# Patient Record
Sex: Female | Born: 1960 | Race: Black or African American | Hispanic: No | Marital: Single | Smoking: Current every day smoker
Health system: Southern US, Community
[De-identification: ages and names within clinical notes are randomized; demographics above are authoritative.]

## PROBLEM LIST (undated history)

## (undated) DIAGNOSIS — I10 Essential (primary) hypertension: Secondary | ICD-10-CM

## (undated) DIAGNOSIS — R079 Chest pain, unspecified: Secondary | ICD-10-CM

## (undated) DIAGNOSIS — K529 Noninfective gastroenteritis and colitis, unspecified: Secondary | ICD-10-CM

## (undated) DIAGNOSIS — K2210 Ulcer of esophagus without bleeding: Secondary | ICD-10-CM

## (undated) DIAGNOSIS — F329 Major depressive disorder, single episode, unspecified: Secondary | ICD-10-CM

## (undated) DIAGNOSIS — K635 Polyp of colon: Secondary | ICD-10-CM

## (undated) DIAGNOSIS — A048 Other specified bacterial intestinal infections: Secondary | ICD-10-CM

## (undated) DIAGNOSIS — K200 Eosinophilic esophagitis: Secondary | ICD-10-CM

## (undated) DIAGNOSIS — L84 Corns and callosities: Secondary | ICD-10-CM

## (undated) DIAGNOSIS — I739 Peripheral vascular disease, unspecified: Secondary | ICD-10-CM

## (undated) DIAGNOSIS — K2270 Barrett's esophagus without dysplasia: Secondary | ICD-10-CM

## (undated) DIAGNOSIS — G2581 Restless legs syndrome: Secondary | ICD-10-CM

## (undated) DIAGNOSIS — K209 Esophagitis, unspecified without bleeding: Secondary | ICD-10-CM

## (undated) DIAGNOSIS — F32A Depression, unspecified: Secondary | ICD-10-CM

## (undated) DIAGNOSIS — J189 Pneumonia, unspecified organism: Secondary | ICD-10-CM

## (undated) DIAGNOSIS — G8929 Other chronic pain: Secondary | ICD-10-CM

## (undated) DIAGNOSIS — K589 Irritable bowel syndrome without diarrhea: Secondary | ICD-10-CM

## (undated) DIAGNOSIS — R0602 Shortness of breath: Secondary | ICD-10-CM

## (undated) DIAGNOSIS — I219 Acute myocardial infarction, unspecified: Secondary | ICD-10-CM

## (undated) DIAGNOSIS — G709 Myoneural disorder, unspecified: Secondary | ICD-10-CM

## (undated) DIAGNOSIS — M549 Dorsalgia, unspecified: Secondary | ICD-10-CM

## (undated) DIAGNOSIS — R072 Precordial pain: Secondary | ICD-10-CM

## (undated) DIAGNOSIS — G4710 Hypersomnia, unspecified: Secondary | ICD-10-CM

## (undated) DIAGNOSIS — J45909 Unspecified asthma, uncomplicated: Secondary | ICD-10-CM

## (undated) DIAGNOSIS — M79671 Pain in right foot: Secondary | ICD-10-CM

## (undated) DIAGNOSIS — R5383 Other fatigue: Secondary | ICD-10-CM

## (undated) DIAGNOSIS — R0989 Other specified symptoms and signs involving the circulatory and respiratory systems: Secondary | ICD-10-CM

## (undated) DIAGNOSIS — M129 Arthropathy, unspecified: Secondary | ICD-10-CM

## (undated) DIAGNOSIS — M1990 Unspecified osteoarthritis, unspecified site: Secondary | ICD-10-CM

## (undated) DIAGNOSIS — E039 Hypothyroidism, unspecified: Secondary | ICD-10-CM

## (undated) DIAGNOSIS — Q398 Other congenital malformations of esophagus: Secondary | ICD-10-CM

## (undated) DIAGNOSIS — K2970 Gastritis, unspecified, without bleeding: Secondary | ICD-10-CM

## (undated) DIAGNOSIS — I2510 Atherosclerotic heart disease of native coronary artery without angina pectoris: Secondary | ICD-10-CM

## (undated) DIAGNOSIS — I6523 Occlusion and stenosis of bilateral carotid arteries: Secondary | ICD-10-CM

## (undated) DIAGNOSIS — E1140 Type 2 diabetes mellitus with diabetic neuropathy, unspecified: Secondary | ICD-10-CM

## (undated) DIAGNOSIS — K5281 Eosinophilic gastritis or gastroenteritis: Secondary | ICD-10-CM

## (undated) DIAGNOSIS — M25511 Pain in right shoulder: Secondary | ICD-10-CM

## (undated) DIAGNOSIS — R109 Unspecified abdominal pain: Secondary | ICD-10-CM

## (undated) DIAGNOSIS — Z90710 Acquired absence of both cervix and uterus: Secondary | ICD-10-CM

## (undated) DIAGNOSIS — R1031 Right lower quadrant pain: Secondary | ICD-10-CM

## (undated) DIAGNOSIS — K219 Gastro-esophageal reflux disease without esophagitis: Secondary | ICD-10-CM

## (undated) HISTORY — DX: Pain in right foot: M79.671

## (undated) HISTORY — DX: Esophagitis, unspecified without bleeding: K20.90

## (undated) HISTORY — PX: THYROIDECTOMY: SHX17

## (undated) HISTORY — DX: Chest pain, unspecified: R07.9

## (undated) HISTORY — DX: Ulcer of esophagus without bleeding: K22.10

## (undated) HISTORY — DX: Restless legs syndrome: G25.81

## (undated) HISTORY — DX: Right lower quadrant pain: R10.31

## (undated) HISTORY — PX: EYE SURGERY: SHX253

## (undated) HISTORY — DX: Occlusion and stenosis of bilateral carotid arteries: I65.23

## (undated) HISTORY — DX: Eosinophilic esophagitis: K20.0

## (undated) HISTORY — DX: Precordial pain: R07.2

## (undated) HISTORY — DX: Arthropathy, unspecified: M12.9

## (undated) HISTORY — DX: Type 2 diabetes mellitus with diabetic neuropathy, unspecified: E11.40

## (undated) HISTORY — DX: Eosinophilic gastritis or gastroenteritis: K52.81

## (undated) HISTORY — DX: Other congenital malformations of esophagus: Q39.8

## (undated) HISTORY — DX: Noninfective gastroenteritis and colitis, unspecified: K52.9

## (undated) HISTORY — PX: BREAST EXCISIONAL BIOPSY: SUR124

## (undated) HISTORY — DX: Pain in right shoulder: M25.511

## (undated) HISTORY — DX: Unspecified abdominal pain: R10.9

## (undated) HISTORY — DX: Acquired absence of both cervix and uterus: Z90.710

## (undated) HISTORY — DX: Dorsalgia, unspecified: M54.9

## (undated) HISTORY — DX: Gastritis, unspecified, without bleeding: K29.70

## (undated) HISTORY — PX: AUGMENTATION MAMMAPLASTY: SUR837

## (undated) HISTORY — DX: Other fatigue: R53.83

## (undated) HISTORY — DX: Other specified bacterial intestinal infections: A04.8

## (undated) HISTORY — DX: Shortness of breath: R06.02

## (undated) HISTORY — DX: Barrett's esophagus without dysplasia: K22.70

## (undated) HISTORY — PX: ABDOMINAL HYSTERECTOMY: SHX81

## (undated) HISTORY — DX: Esophagitis, unspecified: K20.9

## (undated) HISTORY — DX: Polyp of colon: K63.5

## (undated) HISTORY — DX: Atherosclerotic heart disease of native coronary artery without angina pectoris: I25.10

## (undated) HISTORY — DX: Peripheral vascular disease, unspecified: I73.9

## (undated) HISTORY — DX: Other chronic pain: G89.29

## (undated) HISTORY — DX: Hypersomnia, unspecified: G47.10

## (undated) HISTORY — PX: FOOT SURGERY: SHX648

## (undated) HISTORY — DX: Other specified symptoms and signs involving the circulatory and respiratory systems: R09.89

## (undated) HISTORY — DX: Corns and callosities: L84

---
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
## 2016-10-13 ENCOUNTER — Other Ambulatory Visit: Payer: Self-pay | Admitting: Internal Medicine

## 2016-10-13 DIAGNOSIS — Z1231 Encounter for screening mammogram for malignant neoplasm of breast: Secondary | ICD-10-CM

## 2016-10-30 ENCOUNTER — Ambulatory Visit: Payer: Self-pay

## 2016-11-19 ENCOUNTER — Ambulatory Visit
Admission: RE | Admit: 2016-11-19 | Discharge: 2016-11-19 | Disposition: A | Discharge status: Home / Self Care | Payer: Medicaid Other | Source: Ambulatory Visit | Attending: Internal Medicine | Admitting: Internal Medicine

## 2016-11-19 ENCOUNTER — Encounter: Payer: Self-pay | Admitting: Radiology

## 2016-11-19 DIAGNOSIS — Z1231 Encounter for screening mammogram for malignant neoplasm of breast: Secondary | ICD-10-CM

## 2016-11-27 ENCOUNTER — Encounter (HOSPITAL_COMMUNITY): Payer: Self-pay | Admitting: Emergency Medicine

## 2016-11-27 ENCOUNTER — Emergency Department (HOSPITAL_COMMUNITY)
Admission: EM | Admit: 2016-11-27 | Discharge: 2016-11-27 | Disposition: A | Discharge status: Home / Self Care | Payer: Medicaid Other | Attending: Emergency Medicine | Admitting: Emergency Medicine

## 2016-11-27 DIAGNOSIS — S199XXA Unspecified injury of neck, initial encounter: Secondary | ICD-10-CM | POA: Diagnosis present

## 2016-11-27 DIAGNOSIS — Y9289 Other specified places as the place of occurrence of the external cause: Secondary | ICD-10-CM | POA: Diagnosis not present

## 2016-11-27 DIAGNOSIS — S161XXA Strain of muscle, fascia and tendon at neck level, initial encounter: Secondary | ICD-10-CM | POA: Insufficient documentation

## 2016-11-27 DIAGNOSIS — Y939 Activity, unspecified: Secondary | ICD-10-CM | POA: Diagnosis not present

## 2016-11-27 DIAGNOSIS — Y999 Unspecified external cause status: Secondary | ICD-10-CM | POA: Diagnosis not present

## 2016-11-27 HISTORY — DX: Irritable bowel syndrome, unspecified: K58.9

## 2016-11-27 HISTORY — DX: Unspecified osteoarthritis, unspecified site: M19.90

## 2016-11-27 MED ORDER — CYCLOBENZAPRINE HCL 10 MG PO TABS: 10.0000 mg | ORAL_TABLET | Freq: Two times a day (BID) | ORAL | 0 refills | Status: DC | PRN

## 2016-11-27 NOTE — ED Notes (Signed)
See provider assessment 
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
## 2016-11-27 NOTE — Discharge Instructions (Signed)
Please keep your appointment with your primary care provider on Monday, 11/30/16. Return to the Emergency Department if you develop new or worsening symptoms. Please do not take Flexeril if you have to drive because it can make you sleepy.
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
## 2017-05-12 ENCOUNTER — Telehealth: Payer: Self-pay

## 2017-05-12 NOTE — Telephone Encounter (Signed)
SENT NOTES TO NL SCHEDULING.

## 2017-05-17 ENCOUNTER — Telehealth: Payer: Self-pay | Admitting: Cardiovascular Disease

## 2017-05-17 NOTE — Telephone Encounter (Signed)
Received records from [facility] for appointment on 06/08/17 with Dr Allyson Sabal.  Records put with Dr Hazle Coca schedule for 06/08/17. lp

## 2017-05-25 ENCOUNTER — Encounter: Payer: Self-pay | Admitting: Cardiovascular Disease

## 2017-05-25 ENCOUNTER — Other Ambulatory Visit: Payer: Self-pay | Admitting: *Deleted

## 2017-05-25 ENCOUNTER — Ambulatory Visit (INDEPENDENT_AMBULATORY_CARE_PROVIDER_SITE_OTHER): Payer: Medicaid Other | Admitting: Cardiovascular Disease

## 2017-05-25 VITALS — BP 120/70 | HR 75 | Ht 62.0 in | Wt 142.8 lb

## 2017-05-25 DIAGNOSIS — I10 Essential (primary) hypertension: Secondary | ICD-10-CM | POA: Insufficient documentation

## 2017-05-25 DIAGNOSIS — I200 Unstable angina: Secondary | ICD-10-CM

## 2017-05-25 DIAGNOSIS — Z79899 Other long term (current) drug therapy: Secondary | ICD-10-CM

## 2017-05-25 DIAGNOSIS — Z01812 Encounter for preprocedural laboratory examination: Secondary | ICD-10-CM

## 2017-05-25 DIAGNOSIS — I2510 Atherosclerotic heart disease of native coronary artery without angina pectoris: Secondary | ICD-10-CM | POA: Diagnosis not present

## 2017-05-25 DIAGNOSIS — E785 Hyperlipidemia, unspecified: Secondary | ICD-10-CM | POA: Insufficient documentation

## 2017-05-25 DIAGNOSIS — I25110 Atherosclerotic heart disease of native coronary artery with unstable angina pectoris: Secondary | ICD-10-CM

## 2017-05-25 NOTE — Progress Notes (Signed)
05/25/2017 Debra MetroYvonne Valdez   09/11/1960  [id number]  Primary Physician [facility], [facility] Primary Cardiologist: Runell GessJonathan J Gelsey Amyx MD Nicholes CalamityFACP, FACC, FAHA, [place]  HPI:  [name] is a 56 y.o. female single African-American female mother of one daughter, Rica RecordsJamika, who accompanies her today. She was referred by [facility]net[facility]Mike [facility] for consideration of obtuse marginal branch intervention. She has a history of 40-pack-years of tobacco abuse having quit on 04/05/17. She has history of diabetes, hypertension and hyperlipidemia. She had an inferior [facility] [place]. She underwent cardiac catheterization through the right femoral approach on 04/06/17 and had a 2.75 x 32 mm long drug-eluting stent placed in her RCA. She had moderate proximal LAD disease and high-grade obtuse marginal branch disease with normal LV function. She remains on antiplatelet therapy. The intention was to perform staged intervention on the obtuse marginal branch which unfortunately could not occur because of insurance considerations.   Current Meds  Medication Sig  . amLODipine (NORVASC) 5 MG tablet Take 5 mg by mouth daily.  Marland Kitchen. atorvastatin (LIPITOR) 20 MG tablet Take 20 mg by mouth daily.  . baclofen (LIORESAL) 20 MG tablet Take 20 mg by mouth 3 (three) times daily.  Marland Kitchen. BRILINTA 90 MG TABS tablet Take 90 mg by mouth 2 (two) times daily.  . carvedilol (COREG) 6.25 MG tablet Take 6.25 mg by mouth 2 (two) times daily.  . cyclobenzaprine (FLEXERIL) 10 MG tablet Take 1 tablet (10 mg total) by mouth 2 (two) times daily as needed for muscle spasms.  . DULoxetine (CYMBALTA) 30 MG capsule Take 30 mg by mouth daily.  Marland Kitchen. esomeprazole (NEXIUM) 40 MG capsule Take 40 mg by mouth daily.  Marland Kitchen. HYDROcodone-acetaminophen (NORCO) 7.5-325 MG tablet Take 1 tablet by mouth as directed.  Marland Kitchen. levothyroxine (SYNTHROID, LEVOTHROID) 100 MCG tablet Take 100 mcg by mouth daily.  Marland Kitchen. lisinopril (PRINIVIL,ZESTRIL) 20 MG  tablet Take 20 mg by mouth daily.  . metFORMIN (GLUCOPHAGE) 1000 MG tablet Take 1,000 mg by mouth 2 (two) times daily.  . metoCLOPramide (REGLAN) 10 MG tablet Take 10 mg by mouth daily.  . pravastatin (PRAVACHOL) 20 MG tablet Take 20 mg by mouth daily.  . sucralfate (CARAFATE) 1 g tablet Take 1 g by mouth 2 (two) times daily.  . TRESIBA FLEXTOUCH 200 UNIT/ML SOPN Inject 25 Units as directed daily.     Allergies  Allergen Reactions  . Motrin [Ibuprofen] Hives  . Sulfa Antibiotics Hives  . Bactrim [Sulfamethoxazole-Trimethoprim] Rash    Social History   Social History  . Marital status: Single    Spouse name: N/A  . Number of children: N/A  . Years of education: N/A   Occupational History  . Not on file.   Social History Main Topics  . Smoking status: Former Smoker    Types: Cigarettes    Quit date: 04/05/2017  . Smokeless tobacco: Never Used  . Alcohol use Not on file  . Drug use: Unknown  . Sexual activity: Not on file   Other Topics Concern  . Not on file   Social History Narrative  . No narrative on file     Review of Systems: General: negative for chills, fever, night sweats or weight changes.  Cardiovascular: negative for chest pain, dyspnea on exertion, edema, orthopnea, palpitations, paroxysmal nocturnal dyspnea or shortness of breath Dermatological: negative for rash Respiratory: negative for cough or wheezing Urologic: negative for hematuria Abdominal: negative for nausea, vomiting, diarrhea, bright red blood  per rectum, melena, or hematemesis Neurologic: negative for visual changes, syncope, or dizziness All other systems reviewed and are otherwise negative except as noted above.    Blood pressure 120/70, pulse 75, height 5\' 2"  (1.575 m), weight 142 lb 12.8 oz (64.8 kg).  General appearance: alert and no distress Neck: no adenopathy, no carotid bruit, no JVD, supple, symmetrical, trachea midline and thyroid not enlarged, symmetric, no  tenderness/mass/nodules Lungs: clear to auscultation bilaterally Heart: regular rate and rhythm, S1, S2 normal, no murmur, click, rub or gallop Extremities: extremities normal, atraumatic, no cyanosis or edema Pulses: 2+ and symmetric Skin: Skin color, texture, turgor normal. No rashes or lesions Neurologic: Alert and oriented X 3, normal strength and tone. Normal symmetric reflexes. Normal coordination and gait  EKG sinus rhythm at 75 with inferior Q waves and inferolateral T-wave inversion. I personally reviewed this EKG.  ASSESSMENT AND PLAN:   Hyperlipidemia History of hyperlipidemia on statin therapy followed by her PCP  Essential hypertension History of essential hypertension with blood pressure measures 120/70. She is on amlodipine, carvedilol and lisinopril. Continue current meds at current dosing  Coronary artery disease History of CAD status post inferior STEMI 9/3/18at [facility] in [place]. She required catheterization on 04/06/17 revealing an occluded RCA-1 stenting using a 2.75 x 32 mm long drug-eluting stent. In addition, she had high-grade obtuse marginal branch disease and moderate LAD disease with normal LV function. She is on dual antibiotic therapy with aspirin and Brilenta. The plan was to perform staged intervention after obtuse marginal branch however because of continued insurance considerations this could not be done up in [place] and she was referred here for Intervention.      Runell Gess MD FACP,FACC,FAHA, [facility] 05/25/2017 11:44 AM

## 2017-05-25 NOTE — Assessment & Plan Note (Signed)
History of essential hypertension with blood pressure measures 120/70. She is on amlodipine, carvedilol and lisinopril. Continue current meds at current dosing

## 2017-05-25 NOTE — Assessment & Plan Note (Signed)
History of hyperlipidemia on statin therapy followed by her PCP. 

## 2017-05-25 NOTE — Patient Instructions (Signed)
   [facility] [address]  [place] [address] Dept: [phone] Loc: [phone]  Malcolm MetroYvonne Schiffman  05/25/2017  You are scheduled for a Cardiac Catheterization on Thursday, October 25 with Dr. Nanetta BattyJonathan Berry.  1. [facility] at [facility]: [address] [place], [place] [address] at 1:00 PM (two hours before your procedure to ensure your preparation). Free valet parking service is available.   Special note: Every effort is made to have your procedure done on time. Please understand that emergencies sometimes delay scheduled procedures.  2. Diet: you may have a light breakfast before 7:30am  3. Labs: TODAY  4. Medication instructions in preparation for your procedure: HOLD metformin 24 hours prior to procedure and 48 hours after procedure   On the morning of your procedure, take your Aspirin + Brilinta and any morning medicines NOT listed above.  You may use sips of water.  5. Plan for one night stay--bring personal belongings. 6. Bring a current list of your medications and current insurance cards. 7. You MUST have a responsible person to drive you home. 8. Someone MUST be with you the first 24 hours after you arrive home or your discharge will be delayed. 9. Please wear clothes that are easy to get on and off and wear slip-on shoes.  Thank you for allowing us to care for you!   -- [facility]

## 2017-05-25 NOTE — Assessment & Plan Note (Signed)
History of CAD status post inferior [facility] in [place]. She required catheterization on 04/06/17 revealing an occluded RCA-1 stenting using a 2.75 x 32 mm long drug-eluting stent. In addition, she had high-grade obtuse marginal branch disease and moderate LAD disease with normal LV function. She is on dual antibiotic therapy with aspirin and Brilenta. The plan was to perform staged intervention after obtuse marginal branch however because of continued insurance considerations this could not be done up in [place] and she was referred here for Intervention.

## 2017-05-26 ENCOUNTER — Telehealth: Payer: Self-pay

## 2017-05-26 LAB — BASIC METABOLIC PANEL
BUN / CREAT RATIO: 17 (ref 9–23)
BUN: 14 mg/dL (ref 6–24)
CALCIUM: 9.9 mg/dL (ref 8.7–10.2)
CHLORIDE: 100 mmol/L (ref 96–106)
CO2: 25 mmol/L (ref 20–29)
CREATININE: 0.84 mg/dL (ref 0.57–1.00)
GFR calc Af Amer: 89 mL/min/{1.73_m2} (ref >59)
GFR calc non Af Amer: 77 mL/min/{1.73_m2} (ref >59)
GLUCOSE: 202 mg/dL — AB (ref 65–99)
Potassium: 5.2 mmol/L (ref 3.5–5.2)
Sodium: 139 mmol/L (ref 134–144)

## 2017-05-26 LAB — CBC
HEMATOCRIT: 40.7 % (ref 34.0–46.6)
HEMOGLOBIN: 13.6 g/dL (ref 11.1–15.9)
MCH: 28.3 pg (ref 26.6–33.0)
MCHC: 33.4 g/dL (ref 31.5–35.7)
MCV: 85 fL (ref 79–97)
Platelets: 256 10*3/uL (ref 150–379)
RBC: 4.8 x10E6/uL (ref 3.77–5.28)
RDW: 16.3 % — AB (ref 12.3–15.4)
WBC: 10.6 10*3/uL (ref 3.4–10.8)

## 2017-05-26 NOTE — Telephone Encounter (Signed)
Patient returned call. Aware to take half of tresiba today.

## 2017-05-26 NOTE — Telephone Encounter (Signed)
LM for patient to return call per Boneta LucksJenny RN request Patient needs to take half dose [place] today  Called daughter Fredonia Highlandamika - was at [facility] with patient yesterday. She is aware of med instructions for her mom & will relay message.

## 2017-05-26 NOTE — Telephone Encounter (Signed)
Call placed to Pt twice.  Phone picked up, no one would answer.  Sending to nurse for Dr. Allyson SabalBerry.

## 2017-05-27 ENCOUNTER — Encounter (HOSPITAL_COMMUNITY)
Admission: RE | Disposition: A | Discharge status: Home / Self Care | Payer: Self-pay | Source: Ambulatory Visit | Attending: Cardiovascular Disease

## 2017-05-27 ENCOUNTER — Ambulatory Visit (HOSPITAL_COMMUNITY)
Admission: RE | Admit: 2017-05-27 | Discharge: 2017-05-28 | Disposition: A | Discharge status: Home / Self Care | Payer: Medicaid Other | Source: Ambulatory Visit | Attending: Cardiovascular Disease | Admitting: Cardiovascular Disease

## 2017-05-27 DIAGNOSIS — Z7902 Long term (current) use of antithrombotics/antiplatelets: Secondary | ICD-10-CM | POA: Insufficient documentation

## 2017-05-27 DIAGNOSIS — E785 Hyperlipidemia, unspecified: Secondary | ICD-10-CM | POA: Diagnosis not present

## 2017-05-27 DIAGNOSIS — Z882 Allergy status to sulfonamides status: Secondary | ICD-10-CM | POA: Diagnosis not present

## 2017-05-27 DIAGNOSIS — I200 Unstable angina: Secondary | ICD-10-CM

## 2017-05-27 DIAGNOSIS — I2510 Atherosclerotic heart disease of native coronary artery without angina pectoris: Secondary | ICD-10-CM | POA: Diagnosis present

## 2017-05-27 DIAGNOSIS — E119 Type 2 diabetes mellitus without complications: Secondary | ICD-10-CM | POA: Diagnosis not present

## 2017-05-27 DIAGNOSIS — I2583 Coronary atherosclerosis due to lipid rich plaque: Secondary | ICD-10-CM | POA: Diagnosis not present

## 2017-05-27 DIAGNOSIS — Z955 Presence of coronary angioplasty implant and graft: Secondary | ICD-10-CM

## 2017-05-27 DIAGNOSIS — Z886 Allergy status to analgesic agent status: Secondary | ICD-10-CM | POA: Diagnosis not present

## 2017-05-27 DIAGNOSIS — Z79899 Other long term (current) drug therapy: Secondary | ICD-10-CM | POA: Insufficient documentation

## 2017-05-27 DIAGNOSIS — I10 Essential (primary) hypertension: Secondary | ICD-10-CM | POA: Diagnosis not present

## 2017-05-27 DIAGNOSIS — Z7984 Long term (current) use of oral hypoglycemic drugs: Secondary | ICD-10-CM | POA: Insufficient documentation

## 2017-05-27 DIAGNOSIS — I252 Old myocardial infarction: Secondary | ICD-10-CM | POA: Diagnosis not present

## 2017-05-27 DIAGNOSIS — Z87891 Personal history of nicotine dependence: Secondary | ICD-10-CM | POA: Diagnosis not present

## 2017-05-27 DIAGNOSIS — I25110 Atherosclerotic heart disease of native coronary artery with unstable angina pectoris: Secondary | ICD-10-CM | POA: Diagnosis not present

## 2017-05-27 HISTORY — PX: LEFT HEART CATH AND CORONARY ANGIOGRAPHY: CATH118249

## 2017-05-27 HISTORY — PX: CORONARY PRESSURE/FFR STUDY: CATH118243

## 2017-05-27 HISTORY — PX: CORONARY STENT INTERVENTION: CATH118234

## 2017-05-27 LAB — PROTIME-INR
INR: 0.93
Prothrombin Time: 12.3 seconds (ref 11.4–15.2)

## 2017-05-27 LAB — POCT ACTIVATED CLOTTING TIME: Activated Clotting Time: 362 seconds

## 2017-05-27 LAB — GLUCOSE, CAPILLARY: GLUCOSE-CAPILLARY: 200 mg/dL — AB (ref 65–99)

## 2017-05-27 SURGERY — LEFT HEART CATH AND CORONARY ANGIOGRAPHY
Anesthesia: LOCAL

## 2017-05-27 MED ORDER — IOPAMIDOL (ISOVUE-370) INJECTION 76%
INTRAVENOUS | Status: AC
  Filled 2017-05-27: qty 50

## 2017-05-27 MED ORDER — ATORVASTATIN CALCIUM 20 MG PO TABS
20.0000 mg | ORAL_TABLET | Freq: Every day | ORAL | Status: DC
  Administered 2017-05-27 – 2017-05-28 (×2): 20 mg via ORAL
  Filled 2017-05-27 (×2): qty 1

## 2017-05-27 MED ORDER — FENTANYL CITRATE (PF) 100 MCG/2ML IJ SOLN
INTRAMUSCULAR | Status: AC
  Filled 2017-05-27: qty 2

## 2017-05-27 MED ORDER — MORPHINE SULFATE (PF) 10 MG/ML IV SOLN: 2.0000 mg | INTRAVENOUS | Status: DC | PRN

## 2017-05-27 MED ORDER — SODIUM CHLORIDE 0.9% FLUSH
3.0000 mL | Freq: Two times a day (BID) | INTRAVENOUS | Status: DC
  Administered 2017-05-27: 3 mL via INTRAVENOUS

## 2017-05-27 MED ORDER — SODIUM CHLORIDE 0.9% FLUSH: 3.0000 mL | INTRAVENOUS | Status: DC | PRN

## 2017-05-27 MED ORDER — BIVALIRUDIN TRIFLUOROACETATE 250 MG IV SOLR
INTRAVENOUS | Status: AC
  Filled 2017-05-27: qty 250

## 2017-05-27 MED ORDER — SODIUM CHLORIDE 0.9 % IV SOLN: INTRAVENOUS | Status: AC

## 2017-05-27 MED ORDER — AMLODIPINE BESYLATE 5 MG PO TABS
5.0000 mg | ORAL_TABLET | Freq: Every day | ORAL | Status: DC
  Administered 2017-05-27 – 2017-05-28 (×2): 5 mg via ORAL
  Filled 2017-05-27 (×2): qty 1

## 2017-05-27 MED ORDER — ASPIRIN 81 MG PO CHEW
81.0000 mg | CHEWABLE_TABLET | ORAL | Status: AC
  Administered 2017-05-27: 81 mg via ORAL

## 2017-05-27 MED ORDER — ALBUTEROL SULFATE (2.5 MG/3ML) 0.083% IN NEBU: 3.0000 mL | INHALATION_SOLUTION | Freq: Four times a day (QID) | RESPIRATORY_TRACT | Status: DC | PRN

## 2017-05-27 MED ORDER — ADULT MULTIVITAMIN W/MINERALS CH
1.0000 | ORAL_TABLET | Freq: Every day | ORAL | Status: DC
  Administered 2017-05-28: 11:00:00 1 via ORAL
  Filled 2017-05-27: qty 1

## 2017-05-27 MED ORDER — LIDOCAINE HCL 2 % IJ SOLN
INTRAMUSCULAR | Status: DC | PRN
  Administered 2017-05-27: 10 mL via INTRADERMAL

## 2017-05-27 MED ORDER — PANTOPRAZOLE SODIUM 40 MG PO TBEC
40.0000 mg | DELAYED_RELEASE_TABLET | Freq: Every day | ORAL | Status: DC
  Administered 2017-05-28: 11:00:00 40 mg via ORAL
  Filled 2017-05-27: qty 1

## 2017-05-27 MED ORDER — BACLOFEN 20 MG PO TABS
20.0000 mg | ORAL_TABLET | Freq: Three times a day (TID) | ORAL | Status: DC
  Administered 2017-05-27 – 2017-05-28 (×2): 20 mg via ORAL
  Filled 2017-05-27 (×3): qty 1

## 2017-05-27 MED ORDER — ANGIOPLASTY BOOK
Freq: Once | Status: AC
  Administered 2017-05-27: 23:00:00
  Filled 2017-05-27: qty 1

## 2017-05-27 MED ORDER — CLOPIDOGREL BISULFATE 300 MG PO TABS
ORAL_TABLET | ORAL | Status: DC | PRN
  Administered 2017-05-27: 600 mg via ORAL

## 2017-05-27 MED ORDER — TICAGRELOR 90 MG PO TABS: 90.0000 mg | ORAL_TABLET | Freq: Two times a day (BID) | ORAL | Status: DC

## 2017-05-27 MED ORDER — SODIUM CHLORIDE 0.9 % IV SOLN
INTRAVENOUS | Status: DC
  Administered 2017-05-27: 13:00:00 via INTRAVENOUS

## 2017-05-27 MED ORDER — LISINOPRIL 40 MG PO TABS
40.0000 mg | ORAL_TABLET | Freq: Every day | ORAL | Status: DC
  Administered 2017-05-28: 11:00:00 40 mg via ORAL
  Filled 2017-05-27 (×2): qty 1

## 2017-05-27 MED ORDER — MIDAZOLAM HCL 2 MG/2ML IJ SOLN
INTRAMUSCULAR | Status: AC
Start: 2017-05-27 — End: 2017-05-27
  Filled 2017-05-27: qty 2

## 2017-05-27 MED ORDER — LIDOCAINE HCL 2 % IJ SOLN
INTRAMUSCULAR | Status: AC
  Filled 2017-05-27: qty 20

## 2017-05-27 MED ORDER — ACETAMINOPHEN 325 MG PO TABS: 650.0000 mg | ORAL_TABLET | ORAL | Status: DC | PRN

## 2017-05-27 MED ORDER — CARVEDILOL 6.25 MG PO TABS
6.2500 mg | ORAL_TABLET | Freq: Two times a day (BID) | ORAL | Status: DC
  Administered 2017-05-28: 6.25 mg via ORAL
  Filled 2017-05-27: qty 2
  Filled 2017-05-27 (×3): qty 1

## 2017-05-27 MED ORDER — LABETALOL HCL 5 MG/ML IV SOLN: 10.0000 mg | INTRAVENOUS | Status: AC | PRN

## 2017-05-27 MED ORDER — ASPIRIN 81 MG PO CHEW
CHEWABLE_TABLET | ORAL | Status: AC
  Administered 2017-05-27: 81 mg via ORAL
  Filled 2017-05-27: qty 1

## 2017-05-27 MED ORDER — ONDANSETRON HCL 4 MG/2ML IJ SOLN
4.0000 mg | Freq: Four times a day (QID) | INTRAMUSCULAR | Status: DC | PRN
  Administered 2017-05-28: 4 mg via INTRAVENOUS
  Filled 2017-05-27: qty 2

## 2017-05-27 MED ORDER — BIVALIRUDIN TRIFLUOROACETATE 250 MG IV SOLR
INTRAVENOUS | Status: AC | PRN
  Administered 2017-05-27: 17:00:00
  Administered 2017-05-27: 1.75 mg/kg/h via INTRAVENOUS

## 2017-05-27 MED ORDER — METOCLOPRAMIDE HCL 10 MG PO TABS
10.0000 mg | ORAL_TABLET | Freq: Two times a day (BID) | ORAL | Status: DC
  Administered 2017-05-27 – 2017-05-28 (×2): 10 mg via ORAL
  Filled 2017-05-27 (×3): qty 1

## 2017-05-27 MED ORDER — ADENOSINE (DIAGNOSTIC) 140MCG/KG/MIN
INTRAVENOUS | Status: DC | PRN
  Administered 2017-05-27: 140 ug/kg/min via INTRAVENOUS

## 2017-05-27 MED ORDER — IOPAMIDOL (ISOVUE-370) INJECTION 76%
INTRAVENOUS | Status: AC
  Filled 2017-05-27: qty 100

## 2017-05-27 MED ORDER — ASPIRIN EC 81 MG PO TBEC
81.0000 mg | DELAYED_RELEASE_TABLET | Freq: Every day | ORAL | Status: DC
  Administered 2017-05-28: 81 mg via ORAL
  Filled 2017-05-27: qty 1

## 2017-05-27 MED ORDER — SODIUM CHLORIDE 0.9 % IV SOLN: 250.0000 mL | INTRAVENOUS | Status: DC | PRN

## 2017-05-27 MED ORDER — INSULIN GLARGINE 100 UNIT/ML ~~LOC~~ SOLN
25.0000 [IU] | Freq: Every day | SUBCUTANEOUS | Status: DC
  Administered 2017-05-28 (×2): 25 [IU] via SUBCUTANEOUS
  Filled 2017-05-27: qty 0.25

## 2017-05-27 MED ORDER — BIVALIRUDIN BOLUS VIA INFUSION - CUPID
INTRAVENOUS | Status: DC | PRN
  Administered 2017-05-27: 47.625 mg via INTRAVENOUS

## 2017-05-27 MED ORDER — INSULIN GLARGINE 100 UNIT/ML ~~LOC~~ SOLN
25.0000 [IU] | Freq: Every day | SUBCUTANEOUS | Status: DC
  Filled 2017-05-27: qty 0.25

## 2017-05-27 MED ORDER — HYDROCODONE-ACETAMINOPHEN 7.5-325 MG PO TABS
1.0000 | ORAL_TABLET | Freq: Four times a day (QID) | ORAL | Status: DC | PRN
  Administered 2017-05-27: 1 via ORAL
  Filled 2017-05-27: qty 1

## 2017-05-27 MED ORDER — HYDRALAZINE HCL 20 MG/ML IJ SOLN
5.0000 mg | INTRAMUSCULAR | Status: AC | PRN
  Administered 2017-05-27 (×2): 5 mg via INTRAVENOUS
  Filled 2017-05-27: qty 1

## 2017-05-27 MED ORDER — ADENOSINE 12 MG/4ML IV SOLN
INTRAVENOUS | Status: AC
  Filled 2017-05-27: qty 16

## 2017-05-27 MED ORDER — CLOPIDOGREL BISULFATE 75 MG PO TABS
75.0000 mg | ORAL_TABLET | Freq: Every day | ORAL | Status: DC
  Administered 2017-05-28: 75 mg via ORAL
  Filled 2017-05-27: qty 1

## 2017-05-27 MED ORDER — FENTANYL CITRATE (PF) 100 MCG/2ML IJ SOLN
INTRAMUSCULAR | Status: DC | PRN
  Administered 2017-05-27: 25 ug via INTRAVENOUS

## 2017-05-27 MED ORDER — ASPIRIN 81 MG PO CHEW: 81.0000 mg | CHEWABLE_TABLET | Freq: Every day | ORAL | Status: DC

## 2017-05-27 MED ORDER — SODIUM CHLORIDE 0.9 % IV SOLN
1.7500 mg/kg/h | INTRAVENOUS | Status: DC
  Administered 2017-05-27: 20:00:00 1.75 mg/kg/h via INTRAVENOUS
  Filled 2017-05-27: qty 250

## 2017-05-27 MED ORDER — LEVOTHYROXINE SODIUM 100 MCG PO TABS
100.0000 ug | ORAL_TABLET | Freq: Every day | ORAL | Status: DC
  Administered 2017-05-28: 100 ug via ORAL
  Filled 2017-05-27: qty 1

## 2017-05-27 MED ORDER — MIDAZOLAM HCL 2 MG/2ML IJ SOLN
INTRAMUSCULAR | Status: DC | PRN
  Administered 2017-05-27: 1 mg via INTRAVENOUS

## 2017-05-27 SURGICAL SUPPLY — 19 items
BALLN SAPPHIRE 2.0X12 (BALLOONS) ×2
BALLOON SAPPHIRE 2.0X12 (BALLOONS) ×1 IMPLANT
CATH INFINITI 5FR MULTPACK ANG (CATHETERS) ×2 IMPLANT
CATH MICROCATH NAVVUS (MICROCATHETER) ×1 IMPLANT
CATH VISTA GUIDE 6FR JR4 (CATHETERS) ×2 IMPLANT
CATH VISTA GUIDE 6FR JR4 SH (CATHETERS) ×2 IMPLANT
CATH VISTA GUIDE 6FR XB3.5 (CATHETERS) ×2 IMPLANT
KIT ENCORE 26 ADVANTAGE (KITS) ×2 IMPLANT
KIT HEART LEFT (KITS) ×2 IMPLANT
MICROCATHETER NAVVUS (MICROCATHETER) ×2
PACK CARDIAC CATHETERIZATION (CUSTOM PROCEDURE TRAY) ×2 IMPLANT
SHEATH PINNACLE 5F 10CM (SHEATH) ×2 IMPLANT
SHEATH PINNACLE 6F 10CM (SHEATH) ×2 IMPLANT
STENT SYNERGY DES 2.25X12 (Permanent Stent) ×2 IMPLANT
SYR MEDRAD MARK V 150ML (SYRINGE) ×2 IMPLANT
TRANSDUCER W/STOPCOCK (MISCELLANEOUS) ×2 IMPLANT
TUBING CIL FLEX 10 FLL-RA (TUBING) ×2 IMPLANT
WIRE ASAHI PROWATER 180CM (WIRE) ×2 IMPLANT
WIRE EMERALD 3MM-J .035X150CM (WIRE) ×2 IMPLANT

## 2017-05-27 NOTE — H&P (View-Only) (Signed)
   05/25/2017 [name]   07/18/1961  [id number]  Primary Physician [facility], [facility] Primary Cardiologist: Suetta Hoffmeister J Cordae Mccarey MD FACP, FACC, FAHA, FSCAI  HPI:  [name] is a 56 y.o. female single African-American female mother of one daughter, [name], who accompanies her today. She was referred by Mike Duran PAC for consideration of obtuse marginal branch intervention. She has a history of 40-pack-years of tobacco abuse having quit on 04/05/17. She has history of diabetes, hypertension and hyperlipidemia. She had an inferior STEMI 04/05/17 at [facility] in [place]. She underwent cardiac catheterization through the right femoral approach on 04/06/17 and had a 2.75 x 32 mm long drug-eluting stent placed in her RCA. She had moderate proximal LAD disease and high-grade obtuse marginal branch disease with normal LV function. She remains on antiplatelet therapy. The intention was to perform staged intervention on the obtuse marginal branch which unfortunately could not occur because of insurance considerations.   Current Meds  Medication Sig  . amLODipine (NORVASC) 5 MG tablet Take 5 mg by mouth daily.  . atorvastatin (LIPITOR) 20 MG tablet Take 20 mg by mouth daily.  . baclofen (LIORESAL) 20 MG tablet Take 20 mg by mouth 3 (three) times daily.  . BRILINTA 90 MG TABS tablet Take 90 mg by mouth 2 (two) times daily.  . carvedilol (COREG) 6.25 MG tablet Take 6.25 mg by mouth 2 (two) times daily.  . cyclobenzaprine (FLEXERIL) 10 MG tablet Take 1 tablet (10 mg total) by mouth 2 (two) times daily as needed for muscle spasms.  . DULoxetine (CYMBALTA) 30 MG capsule Take 30 mg by mouth daily.  . esomeprazole (NEXIUM) 40 MG capsule Take 40 mg by mouth daily.  . HYDROcodone-acetaminophen (NORCO) 7.5-325 MG tablet Take 1 tablet by mouth as directed.  . levothyroxine (SYNTHROID, LEVOTHROID) 100 MCG tablet Take 100 mcg by mouth daily.  . lisinopril (PRINIVIL,ZESTRIL) 20 MG  tablet Take 20 mg by mouth daily.  . metFORMIN (GLUCOPHAGE) 1000 MG tablet Take 1,000 mg by mouth 2 (two) times daily.  . metoCLOPramide (REGLAN) 10 MG tablet Take 10 mg by mouth daily.  . pravastatin (PRAVACHOL) 20 MG tablet Take 20 mg by mouth daily.  . sucralfate (CARAFATE) 1 g tablet Take 1 g by mouth 2 (two) times daily.  . TRESIBA FLEXTOUCH 200 UNIT/ML SOPN Inject 25 Units as directed daily.     Allergies  Allergen Reactions  . Motrin [Ibuprofen] Hives  . Sulfa Antibiotics Hives  . Bactrim [Sulfamethoxazole-Trimethoprim] Rash    Social History   Social History  . Marital status: Single    Spouse name: N/A  . Number of children: N/A  . Years of education: N/A   Occupational History  . Not on file.   Social History Main Topics  . Smoking status: Former Smoker    Types: Cigarettes    Quit date: 04/05/2017  . Smokeless tobacco: Never Used  . Alcohol use Not on file  . Drug use: Unknown  . Sexual activity: Not on file   Other Topics Concern  . Not on file   Social History Narrative  . No narrative on file     Review of Systems: General: negative for chills, fever, night sweats or weight changes.  Cardiovascular: negative for chest pain, dyspnea on exertion, edema, orthopnea, palpitations, paroxysmal nocturnal dyspnea or shortness of breath Dermatological: negative for rash Respiratory: negative for cough or wheezing Urologic: negative for hematuria Abdominal: negative for nausea, vomiting, diarrhea, bright red blood   per rectum, melena, or hematemesis Neurologic: negative for visual changes, syncope, or dizziness All other systems reviewed and are otherwise negative except as noted above.    Blood pressure 120/70, pulse 75, height 5' 2" (1.575 m), weight 142 lb 12.8 oz (64.8 kg).  General appearance: alert and no distress Neck: no adenopathy, no carotid bruit, no JVD, supple, symmetrical, trachea midline and thyroid not enlarged, symmetric, no  tenderness/mass/nodules Lungs: clear to auscultation bilaterally Heart: regular rate and rhythm, S1, S2 normal, no murmur, click, rub or gallop Extremities: extremities normal, atraumatic, no cyanosis or edema Pulses: 2+ and symmetric Skin: Skin color, texture, turgor normal. No rashes or lesions Neurologic: Alert and oriented X 3, normal strength and tone. Normal symmetric reflexes. Normal coordination and gait  EKG sinus rhythm at 75 with inferior Q waves and inferolateral T-wave inversion. I personally reviewed this EKG.  ASSESSMENT AND PLAN:   Hyperlipidemia History of hyperlipidemia on statin therapy followed by her PCP  Essential hypertension History of essential hypertension with blood pressure measures 120/70. She is on amlodipine, carvedilol and lisinopril. Continue current meds at current dosing  Coronary artery disease History of CAD status post inferior STEMI 9/3/18at [facility] in [place]. She required catheterization on 04/06/17 revealing an occluded RCA-1 stenting using a 2.75 x 32 mm long drug-eluting stent. In addition, she had high-grade obtuse marginal branch disease and moderate LAD disease with normal LV function. She is on dual antibiotic therapy with aspirin and Brilenta. The plan was to perform staged intervention after obtuse marginal branch however because of continued insurance considerations this could not be done up in [place] and she was referred here for Intervention.      [name] J. [name] MD FACP,FACC,FAHA, FSCAI 05/25/2017 11:44 AM 

## 2017-05-27 NOTE — Interval H&P Note (Signed)
Cath Lab Visit (complete for each Cath Lab visit)  Clinical Evaluation Leading to the Procedure:   ACS: No.  Non-ACS:    Anginal Classification: CCS II  Anti-ischemic medical therapy: Minimal Therapy (1 class of medications)  Non-Invasive Test Results: No non-invasive testing performed  Prior CABG: No previous CABG      History and Physical Interval Note:  05/27/2017 3:56 PM  [name]  has presented today for surgery, with the diagnosis of Unstable angina  The various methods of treatment have been discussed with the patient and family. After consideration of risks, benefits and other options for treatment, the patient has consented to  Procedure(s): LEFT HEART CATH AND CORONARY ANGIOGRAPHY (N/A) as a surgical intervention .  The patient's history has been reviewed, patient examined, no change in status, stable for surgery.  I have reviewed the patient's chart and labs.  Questions were answered to the patient's satisfaction.     Nanetta BattyBerry, Jonathan

## 2017-05-28 ENCOUNTER — Telehealth: Payer: Self-pay | Admitting: Cardiovascular Disease

## 2017-05-28 ENCOUNTER — Ambulatory Visit (HOSPITAL_COMMUNITY): Payer: Medicaid Other

## 2017-05-28 ENCOUNTER — Encounter (HOSPITAL_COMMUNITY): Payer: Self-pay | Admitting: Cardiovascular Disease

## 2017-05-28 DIAGNOSIS — I2583 Coronary atherosclerosis due to lipid rich plaque: Secondary | ICD-10-CM | POA: Diagnosis not present

## 2017-05-28 DIAGNOSIS — E785 Hyperlipidemia, unspecified: Secondary | ICD-10-CM | POA: Diagnosis not present

## 2017-05-28 DIAGNOSIS — I10 Essential (primary) hypertension: Secondary | ICD-10-CM

## 2017-05-28 DIAGNOSIS — E119 Type 2 diabetes mellitus without complications: Secondary | ICD-10-CM | POA: Diagnosis not present

## 2017-05-28 DIAGNOSIS — I25119 Atherosclerotic heart disease of native coronary artery with unspecified angina pectoris: Secondary | ICD-10-CM | POA: Diagnosis not present

## 2017-05-28 DIAGNOSIS — I200 Unstable angina: Secondary | ICD-10-CM | POA: Diagnosis not present

## 2017-05-28 DIAGNOSIS — I25110 Atherosclerotic heart disease of native coronary artery with unstable angina pectoris: Secondary | ICD-10-CM | POA: Diagnosis not present

## 2017-05-28 DIAGNOSIS — Z87891 Personal history of nicotine dependence: Secondary | ICD-10-CM | POA: Diagnosis not present

## 2017-05-28 LAB — BASIC METABOLIC PANEL
ANION GAP: 8 (ref 5–15)
BUN: 11 mg/dL (ref 6–20)
CHLORIDE: 101 mmol/L (ref 101–111)
CO2: 25 mmol/L (ref 22–32)
Calcium: 9.1 mg/dL (ref 8.9–10.3)
Creatinine, Ser: 0.81 mg/dL (ref 0.44–1.00)
GFR calc non Af Amer: >60 mL/min (ref >60)
GLUCOSE: 210 mg/dL — AB (ref 65–99)
Potassium: 5.1 mmol/L (ref 3.5–5.1)
Sodium: 134 mmol/L — ABNORMAL LOW (ref 135–145)

## 2017-05-28 LAB — CBC
HEMATOCRIT: 42.1 % (ref 36.0–46.0)
HEMOGLOBIN: 14.3 g/dL (ref 12.0–15.0)
MCH: 29 pg (ref 26.0–34.0)
MCHC: 34 g/dL (ref 30.0–36.0)
MCV: 85.4 fL (ref 78.0–100.0)
Platelets: 215 10*3/uL (ref 150–400)
RBC: 4.93 MIL/uL (ref 3.87–5.11)
RDW: 15.8 % — ABNORMAL HIGH (ref 11.5–15.5)
WBC: 10.8 10*3/uL — ABNORMAL HIGH (ref 4.0–10.5)

## 2017-05-28 LAB — GLUCOSE, CAPILLARY
GLUCOSE-CAPILLARY: 115 mg/dL — AB (ref 65–99)
GLUCOSE-CAPILLARY: 298 mg/dL — AB (ref 65–99)

## 2017-05-28 MED ORDER — NITROGLYCERIN 0.4 MG SL SUBL: 0.4000 mg | SUBLINGUAL_TABLET | SUBLINGUAL | 12 refills | Status: AC | PRN

## 2017-05-28 MED ORDER — CLOPIDOGREL BISULFATE 75 MG PO TABS: 75.0000 mg | ORAL_TABLET | Freq: Every day | ORAL | 11 refills | Status: DC

## 2017-05-28 MED ORDER — SODIUM CHLORIDE 0.9 % IV BOLUS (SEPSIS): 200.0000 mL | Freq: Once | INTRAVENOUS | Status: AC

## 2017-05-28 MED ORDER — PANTOPRAZOLE SODIUM 40 MG PO TBEC: 40.0000 mg | DELAYED_RELEASE_TABLET | Freq: Every day | ORAL | 11 refills | Status: DC

## 2017-05-28 MED ORDER — ATROPINE SULFATE 1 MG/10ML IJ SOSY
0.5000 mg | PREFILLED_SYRINGE | Freq: Once | INTRAMUSCULAR | Status: AC
  Administered 2017-05-28: 0.5 mg via INTRAVENOUS

## 2017-05-28 NOTE — Discharge Summary (Signed)
Discharge Summary    Patient ID: [name],  MRN: [id number], DOB/AGE: 1961/02/16 56 y.o.  Admit date: 05/27/2017 Discharge date: 05/28/2017  Primary Care Provider: [facility] Primary Cardiologist: Dr. Allyson Sabal  Discharge Diagnoses    Active Problems:   Coronary artery disease   CAD (coronary artery disease)   Unstable angina (HCC)   HTn   HLD   DM  Allergies Allergies  Allergen Reactions  . Motrin [Ibuprofen] Hives  . Bactrim [Sulfamethoxazole-Trimethoprim] Rash  . Sulfa Antibiotics Hives and Rash    Diagnostic Studies/Procedures    CORONARY STENT INTERVENTION  INTRAVASCULAR PRESSURE WIRE/FFR STUDY  LEFT HEART CATH AND CORONARY ANGIOGRAPHY  Conclusion     Prox RCA to Mid RCA lesion, 0 %stenosed.  Prox RCA lesion, 60 %stenosed.  Mid LAD lesion, 50 %stenosed.  Ost 1st Mrg to 1st Mrg lesion, 90 %stenosed.  Post intervention, there is a 0% residual stenosis.  A stent was successfully placed.  There is mild to moderate left ventricular systolic dysfunction.  LV end diastolic pressure is normal.  The left ventricular ejection fraction is 45-50% by visual estimate.   IMPRESSION:successful first obtuse marginal branch PCI and drug-eluting stenting using a Synergy 2.25 x 12 mm long drug-eluting stent deployed at 2.4 mm. Negative FFR of proximal RCA. She had mild LV dysfunction with an EF in the 50% range and mild inferobasal hypokinesia. Angiomax will continue full dose for 4 hours. She will then be removed and pressure held. The patient will be discharged home in the morning on aspirin and Plavix. She will follow-up with Arnette Felts [facility] as an outpatient.   Diagnostic Diagram       Post-Intervention Diagram           History of Present Illness     56 y.o. female with hx of CAD s/p DES to RCA 04/06/17, DM, HTn, HLD, 40 pack year tobacco smoking (quit 04/05/17) presented for scheduled cath.   She had an inferior STEMI 04/05/17 at  [facility] in [place]. She underwent cardiac catheterization through the right femoral approach on 04/06/17 and had a 2.75 x 32 mm long drug-eluting stent placed in her RCA. She had moderate proximal LAD disease and high-grade obtuse marginal branch disease with normal LV function. She remains on antiplatelet therapy. The intention was to perform staged intervention on the obtuse marginal branch which unfortunately could not occur because of insurance considerations.  Hospital Course     Consultants: None  1. CAD s/p DES to RCA 04/06/17 - S/p PTCA and synergy DES to 1st OM. Negative FFR of proximal RCA. No chest pain. Ambulated well. CRP II.   - Brillinted changed to Plavix this admission post cath by Dr. Allyson Sabal.   2. HLD - Followed by PCP. LDL goal less than 70. Continue Lipitor at current dose of 20mg .   3. Hypertension - She had a drop in blood pressure during sheath removal. Likely vagal. BP and pulse stable. CT of abdomen & pelvis without evidence of retroperitoneal or extraperitoneal pelvic hematoma.  The patient has been seen by Dr. Herbie Baltimore today and deemed ready for discharge home. All follow-up appointments have been scheduled. Discharge medications are listed below.  _____________   Discharge Vitals Blood pressure 122/82, pulse (!) 58, temperature 98.5 F (36.9 C), temperature source Oral, resp. rate (!) 21, height 5\' 2"  (1.575 m), weight 140 lb (63.5 kg), SpO2 95 %.  Filed Weights   05/27/17 1145  Weight: 140 lb (  63.5 kg)    Labs & Radiologic Studies     CBC  Recent Labs  05/25/17 1219 05/28/17 0319  WBC 10.6 10.8*  HGB 13.6 14.3  HCT 40.7 42.1  MCV 85 85.4  PLT 256 215   Basic Metabolic Panel  Recent Labs  05/25/17 1219 05/28/17 0319  NA 139 134*  K 5.2 5.1  CL 100 101  CO2 25 25  GLUCOSE 202* 210*  BUN 14 11  CREATININE 0.84 0.81  CALCIUM 9.9 9.1     Ct Pelvis Wo Contrast  Result Date: 05/28/2017 CLINICAL DATA:   Hypotension. Right groin access for cardiac catheterization 1 day prior. EXAM: CT PELVIS WITHOUT CONTRAST TECHNIQUE: Multidetector CT imaging of the pelvis was performed following the standard protocol without intravenous contrast. COMPARISON:  None. FINDINGS: Urinary Tract: Excreted contrast is noted in the normal nondistended bladder. Visualized distal ureters are normal caliber. Bowel: Mild sigmoid diverticulosis. No evidence of bowel wall thickening or pericolonic fat stranding. Normal caliber pelvic bowel loops. Vascular/Lymphatic: Atherosclerotic nonaneurysmal visualized lower abdominal aorta. Iliofemoral atherosclerosis. Mild fat stranding anterior to the right common femoral vessels without discrete fluid collection. No pathologically enlarged pelvic lymph nodes. Reproductive: Status post hysterectomy, with no abnormal findings at the vaginal cuff. No adnexal mass. Other: No evidence of retroperitoneal or extraperitoneal pelvic hematoma. Musculoskeletal: No suspicious bone lesions identified. IMPRESSION: 1. No evidence of retroperitoneal or extraperitoneal pelvic hematoma. Expected mild post procedure fat stranding anterior to the right common femoral vessels without focal fluid collection. 2. Mild sigmoid diverticulosis. 3.  Aortic Atherosclerosis (ICD10-I70.0). Electronically Signed   By: Delbert PhenixJason A Poff M.D.   On: 05/28/2017 08:38    Disposition   Pt is being discharged home today in good condition.  Follow-up Plans & Appointments    Follow-up Information    Runell GessBerry, Jonathan J, MD. Go on 06/08/2017.   Specialties:  Cardiology, Radiology Why:  @11 :45 for hospital follow up Contact information: [address] [place] [address] [phone]          Discharge Instructions    AMB Referral to Cardiac Rehabilitation - Phase II    Complete by:  As directed    Diagnosis:  Coronary Stents   Amb Referral to Cardiac Rehabilitation    Complete by:  As directed    Diagnosis:   Coronary Stents   Diet - low sodium heart healthy    Complete by:  As directed    Discharge instructions    Complete by:  As directed    No driving for 48 hours. No lifting over 5 lbs for 1 week. No sexual activity for 1 week. You may return to work on 06/02/17. Keep procedure site clean & dry. If you notice increased pain, swelling, bleeding or pus, call/return!  You may shower, but no soaking baths/hot tubs/pools for 1 week.   Hold metformin today and tomorrow. Resume Sunday 05/30/17.  Some studies suggest Prilosec/Omeprazole interacts with Plavix. We changed your Prilosec/Omeprazole to Protonix for less chance of interaction.   Increase activity slowly    Complete by:  As directed       Discharge Medications   Current Discharge Medication List    START taking these medications   Details  clopidogrel (PLAVIX) 75 MG tablet Take 1 tablet (75 mg total) by mouth daily. Qty: 30 tablet, Refills: 11    nitroGLYCERIN (NITROSTAT) 0.4 MG SL tablet Place 1 tablet (0.4 mg total) under the tongue every 5 (five) minutes as needed for chest  pain. Qty: 25 tablet, Refills: 12    pantoprazole (PROTONIX) 40 MG tablet Take 1 tablet (40 mg total) by mouth daily. Qty: 30 tablet, Refills: 11      CONTINUE these medications which have NOT CHANGED   Details  amLODipine (NORVASC) 5 MG tablet Take 5 mg by mouth daily. Refills: 2    aspirin EC 81 MG tablet Take 81 mg by mouth daily.    atorvastatin (LIPITOR) 20 MG tablet Take 20 mg by mouth daily. Refills: 2    baclofen (LIORESAL) 20 MG tablet Take 20 mg by mouth 3 (three) times daily. Refills: 2    carvedilol (COREG) 6.25 MG tablet Take 6.25 mg by mouth 2 (two) times daily. Refills: 0    HYDROcodone-acetaminophen (NORCO) 7.5-325 MG tablet Take 1 tablet by mouth 4 (four) times daily as needed for moderate pain.  Refills: 0    levothyroxine (SYNTHROID, LEVOTHROID) 100 MCG tablet Take 100 mcg by mouth daily.    lisinopril (PRINIVIL,ZESTRIL)  20 MG tablet Take 40 mg by mouth daily.     metFORMIN (GLUCOPHAGE) 1000 MG tablet Take 1,000 mg by mouth 2 (two) times daily. Refills: 3    metoCLOPramide (REGLAN) 10 MG tablet Take 10 mg by mouth 2 (two) times daily.     Multiple Vitamin (MULTIVITAMIN WITH MINERALS) TABS tablet Take 1 tablet by mouth daily.    TRESIBA FLEXTOUCH 200 UNIT/ML SOPN Inject 25 Units as directed daily. Refills: 2    albuterol (PROAIR HFA) 108 (90 Base) MCG/ACT inhaler Inhale 2 puffs into the lungs every 6 (six) hours as needed for wheezing or shortness of breath.      STOP taking these medications     BRILINTA 90 MG TABS tablet      esomeprazole (NEXIUM) 40 MG capsule            Outstanding Labs/Studies   None  Duration of Discharge Encounter   Greater than 30 minutes including physician time.  Signed, Bhagat,Bhavinkumar PA-C 05/28/2017, 11:35 AM  I have seen, examined and evaluated the patient this Am along with Mr. Iver Nestle, [place].  After reviewing all the available data and chart, we discussed the patients laboratory, study & physical findings as well as symptoms in detail. I agree with his. findings, examination as well as impression, summary & d/c recommendations as per our discussion.    Ms. Chesney was somewhat groggy this morning, but did wake up and ambulated without much difficulty.  For an unclear reason, Dr. Allyson Sabal had switched her from Brilinta to Plavix.  I am not exactly clear why this was done, but she had picked up a Brilinta prescription for 90 days. --My instructions will be for her to continue Brilinta until seen in follow-up.  If she completes her Brilinta, and has not heard otherwise from her cardiologist, she should then use the Plavix started at this admission.   She had some mild hypotension at the time of sheath removal, but this is cleared up.  She is ambulated in the hall without any difficulty.  Her CT of the abdomen pelvis did not show sign of bleed.  She is therefore stable  for discharge.    Bryan Lemma, M.D., M.S. Interventional Cardiologist   Pager # [phone] Phone # [phone] [address] [place], [place] [address]

## 2017-05-28 NOTE — Telephone Encounter (Signed)
[name] is calling wanting to know should she take the Brilinta in which she has a 90 day supply for and take this until she can go get the Plavix . Please call

## 2017-05-28 NOTE — Progress Notes (Signed)
CARDIAC REHAB PHASE I   PRE:  Rate/Rhythm: 65 SR  BP:  Sitting: 103/58      SaO2: 96 RA  MODE:  Ambulation: 470 ft   POST:  Rate/Rhythm: 79 SR  BP:  Sitting: 131/66      SaO2: 96 RA  Pt ambulated 470 feet with gait belt assistance. Pt had no complaints of CP or SOB. Pt returned to recliner with call bell within reach. Ed was completed with pt. Reviewed Stent Booklet, stent card, anti-platelet therapy, NTG, restrictions, heart healthy diet, diabetes handout, exercise guidelines, and CRP II. Will send referral to [facility] in [place].   [phone]  Debra Ceriseyara R Makailee Nudelman MS, ACSM CEP  10:48 AM 05/28/2017

## 2017-05-28 NOTE — Progress Notes (Signed)
Progress Note  Patient Name: [name] Date of Encounter: 05/28/2017  Primary Cardiologist: Dr. Allyson SabalBerry   Subjective   No chest pain or shortness of breath.   Inpatient Medications    Scheduled Meds: . amLODipine  5 mg Oral Daily  . aspirin EC  81 mg Oral Daily  . atorvastatin  20 mg Oral Daily  . baclofen  20 mg Oral TID  . carvedilol  6.25 mg Oral BID WC  . clopidogrel  75 mg Oral Daily  . insulin glargine  25 Units Subcutaneous Daily  . levothyroxine  100 mcg Oral QAC breakfast  . lisinopril  40 mg Oral Daily  . metoCLOPramide  10 mg Oral BID WC  . multivitamin with minerals  1 tablet Oral Daily  . pantoprazole  40 mg Oral Daily  . sodium chloride flush  3 mL Intravenous Q12H   Continuous Infusions: . sodium chloride     PRN Meds: sodium chloride, acetaminophen, albuterol, HYDROcodone-acetaminophen, morphine injection, ondansetron (ZOFRAN) IV, sodium chloride flush   Vital Signs    Vitals:   05/28/17 0400 05/28/17 0505 05/28/17 0600 05/28/17 0800  BP: 137/69 (!) 143/32 (!) 124/52 122/82  Pulse: 73 72 64 (!) 58  Resp: 19 (!) 23 15 (!) 21  Temp:      TempSrc:      SpO2: 94% 95% 93% 95%  Weight:      Height:        Intake/Output Summary (Last 24 hours) at 05/28/17 0826 Last data filed at 05/28/17 0545  Gross per 24 hour  Intake          1625.17 ml  Output              200 ml  Net          1425.17 ml   Filed Weights   05/27/17 1145  Weight: 140 lb (63.5 kg)    Telemetry    SR at rate of 50s - Personally Reviewed  ECG    SR with inferior lateral TWI  - Personally Reviewed  Physical Exam   GEN: No acute distress.   Neck: No JVD Cardiac: RRR, no murmurs, rubs, or gallops. R groin without hematoma or ecchymosis.  Respiratory: Clear to auscultation bilaterally. GI: Soft, nontender, non-distended  MS: No edema; No deformity. Neuro:  Nonfocal  Psych: Normal affect   Labs    Chemistry Recent Labs Lab 05/25/17 1219 05/28/17 0319  NA 139  134*  K 5.2 5.1  CL 100 101  CO2 25 25  GLUCOSE 202* 210*  BUN 14 11  CREATININE 0.84 0.81  CALCIUM 9.9 9.1  GFRNONAA 77 >60  GFRAA 89 >60  ANIONGAP  --  8     Hematology Recent Labs Lab 05/25/17 1219 05/28/17 0319  WBC 10.6 10.8*  RBC 4.80 4.93  HGB 13.6 14.3  HCT 40.7 42.1  MCV 85 85.4  MCH 28.3 29.0  MCHC 33.4 34.0  RDW 16.3* 15.8*  PLT 256 215    Radiology    No results found.  Cardiac Studies   CORONARY STENT INTERVENTION  INTRAVASCULAR PRESSURE WIRE/FFR STUDY  LEFT HEART CATH AND CORONARY ANGIOGRAPHY  Conclusion     Prox RCA to Mid RCA lesion, 0 %stenosed.  Prox RCA lesion, 60 %stenosed.  Mid LAD lesion, 50 %stenosed.  Ost 1st Mrg to 1st Mrg lesion, 90 %stenosed.  Post intervention, there is a 0% residual stenosis.  A stent was successfully placed.  There is mild to  moderate left ventricular systolic dysfunction.  LV end diastolic pressure is normal.  The left ventricular ejection fraction is 45-50% by visual estimate.   IMPRESSION:successful first obtuse marginal branch PCI and drug-eluting stenting using a Synergy 2.25 x 12 mm long drug-eluting stent deployed at 2.4 mm. Negative FFR of proximal RCA. She had mild LV dysfunction with an EF in the 50% range and mild inferobasal hypokinesia. Angiomax will continue full dose for 4 hours. She will then be removed and pressure held. The patient will be discharged home in the morning on aspirin and Plavix. She will follow-up with Arnette Felts [facility] as an outpatient.   Diagnostic Diagram       Post-Intervention Diagram         Patient Profile     56 y.o. female with hx of CAD s/p DES to RCA 04/06/17, DM, HTn, HLD, 40 pack year tobacco smoking (quit 04/05/17) presented for scheduled cath.   She had an inferior STEMI 04/05/17 at [facility] in [place]. She underwent cardiac catheterization through the right femoral approach on 04/06/17 and had a 2.75 x 32 mm long drug-eluting  stent placed in her RCA. She had moderate proximal LAD disease and high-grade obtuse marginal branch disease with normal LV function. She remains on antiplatelet therapy. The intention was to perform staged intervention on the obtuse marginal branch which unfortunately could not occur because of insurance considerations.   Assessment & Plan    1. CAD s/p DES to RCA 04/06/17 - S/p PTCA and synergy DES to 1st OM. Negative FFR of proximal RCA. New lateral TWI today. No chest pain. Continue current medications.   2. HLD - Followed by PCP. LDL goal less than 70. Continue Lipitor at current dose of 20mg .   3. Hypertension - BP dropped last night when pulling sheath. Likely vagal.  Pending CT of pelvis. BP and pulse stable    For questions or updates, please contact [facility] Please consult [email or web address] for contact info under Cardiology/STEMI.      Signed, Manson Passey, PA  05/28/2017, 8:26 AM    I have seen, examined and evaluated the patient this AM along with MR. Bhagat, PA.  After reviewing all the available data and chart, we discussed the patients laboratory, study & physical findings as well as symptoms in detail. I agree with her findings, examination as well as impression recommendations as per our discussion.    Attending adjustments noted in italics.   Miss Abt added a somewhat uneventful intervention of her OM along with FFR of the proximal RCA that was not significant, but had a drop in blood pressure during sheath removal. She therefore had a CT scan of her abdomen and pelvis ordered which is not yet resulted.  Currently she is hemodynamically stable with no significant findings on her groin disease is suspect a hematoma.  Portion she seems somewhat sleepy and groggy this morning. Like to see how she does up walking around to ensure that she is stable. If she is able to relate mildly with cardiac rehabilitation, I see no reason for not be discharged home today.  Her  blood pressure stable and she can continue home medications.  Follow-up with Dr. Leanne Lovely, M.D., M.S. Interventional Cardiologist   Pager # [phone] Phone # [phone] [address] [place], [place] [address]

## 2017-05-28 NOTE — Telephone Encounter (Signed)
Patient states she picked up brilitina from the pharmacy  Yesterday  - has 90 day supply.   She wanted to know if she start  plavix instead.  RN reviewed with cardiologist  ( dr harding)that discharge patient. Per order, finish BRILLINTA 90 day supply and then  Start plavix take 4 tablets the first day then take one tablet daily thereafter.  patient verbalized understanding.

## 2017-05-28 NOTE — Progress Notes (Signed)
Site area: right groin  Site Prior to Removal:  Level 0  Pressure Applied For 30 MINUTES    Minutes Beginning at 2350  Manual:   Yes.    Patient Status During Pull:  Vagal response x 1  Post Pull Groin Site:  Level 0  Post Pull Instructions Given:  Yes.    Post Pull Pulses Present:  Yes.    Dressing Applied:  Yes.    Comments:  Groin site instructions reinforced pt verbalizes and demonstrates understanding

## 2017-05-28 NOTE — Progress Notes (Addendum)
S/p sheath pull blood pressure continues to remain soft, patient arousable to verbal stimuli, oriented x 4.  Dr. Mayford Knifeurner notified about BP and HR; orders CT scan of pelvis. Will hold coreg and lisinopril per Dr. Mayford Knifeurner

## 2017-05-28 NOTE — Care Management Note (Addendum)
Case Management Note  Patient Details  Name: [name] MRN: [id number] Date of Birth: 11-Mar-1961  Subjective/Objective:   From home, s/p coronary intervention ,will be on plavix, her daughter lives with her, she will asst her at home, she has a PCP and medication coverage thru Medicaid.  She is for dc today, no needs.                  Action/Plan:   Expected Discharge Date:                  Expected Discharge Plan:  Home/Self Care  In-House Referral:     Discharge planning Services  CM Consult  Post Acute Care Choice:    Choice offered to:     DME Arranged:    DME Agency:     HH Arranged:    HH Agency:     Status of Service:  Completed, signed off  If discussed at [employer] Length of Stay Meetings, dates discussed:    Additional Comments:  Leone Havenaylor, Giah Fickett [place], RN 05/28/2017, 9:32 AM

## 2017-05-31 ENCOUNTER — Telehealth (HOSPITAL_COMMUNITY): Payer: Self-pay

## 2017-05-31 NOTE — Telephone Encounter (Signed)
Patients insurance is active and benefits verified with Medicaid - No co-pay, no deductible, no out of pocket amount, no co-insurance, and no pre-authorization is required. Passport/reference [phone]#[id number]

## 2017-06-04 DIAGNOSIS — L84 Corns and callosities: Secondary | ICD-10-CM | POA: Insufficient documentation

## 2017-06-04 DIAGNOSIS — R109 Unspecified abdominal pain: Secondary | ICD-10-CM

## 2017-06-04 DIAGNOSIS — A048 Other specified bacterial intestinal infections: Secondary | ICD-10-CM | POA: Insufficient documentation

## 2017-06-04 DIAGNOSIS — M79671 Pain in right foot: Secondary | ICD-10-CM

## 2017-06-04 DIAGNOSIS — K209 Esophagitis, unspecified without bleeding: Secondary | ICD-10-CM | POA: Insufficient documentation

## 2017-06-04 DIAGNOSIS — I739 Peripheral vascular disease, unspecified: Secondary | ICD-10-CM

## 2017-06-04 DIAGNOSIS — Q398 Other congenital malformations of esophagus: Secondary | ICD-10-CM | POA: Insufficient documentation

## 2017-06-04 DIAGNOSIS — M25511 Pain in right shoulder: Secondary | ICD-10-CM

## 2017-06-04 DIAGNOSIS — K2270 Barrett's esophagus without dysplasia: Secondary | ICD-10-CM

## 2017-06-04 DIAGNOSIS — G2581 Restless legs syndrome: Secondary | ICD-10-CM

## 2017-06-04 DIAGNOSIS — R5383 Other fatigue: Secondary | ICD-10-CM | POA: Insufficient documentation

## 2017-06-04 DIAGNOSIS — K529 Noninfective gastroenteritis and colitis, unspecified: Secondary | ICD-10-CM | POA: Insufficient documentation

## 2017-06-04 DIAGNOSIS — Z90710 Acquired absence of both cervix and uterus: Secondary | ICD-10-CM | POA: Insufficient documentation

## 2017-06-04 DIAGNOSIS — I2510 Atherosclerotic heart disease of native coronary artery without angina pectoris: Secondary | ICD-10-CM | POA: Insufficient documentation

## 2017-06-04 DIAGNOSIS — I6523 Occlusion and stenosis of bilateral carotid arteries: Secondary | ICD-10-CM | POA: Insufficient documentation

## 2017-06-04 DIAGNOSIS — K2210 Ulcer of esophagus without bleeding: Secondary | ICD-10-CM

## 2017-06-04 DIAGNOSIS — M549 Dorsalgia, unspecified: Secondary | ICD-10-CM

## 2017-06-04 DIAGNOSIS — G8929 Other chronic pain: Secondary | ICD-10-CM | POA: Insufficient documentation

## 2017-06-04 DIAGNOSIS — K200 Eosinophilic esophagitis: Secondary | ICD-10-CM

## 2017-06-04 DIAGNOSIS — R079 Chest pain, unspecified: Secondary | ICD-10-CM

## 2017-06-04 DIAGNOSIS — K2970 Gastritis, unspecified, without bleeding: Secondary | ICD-10-CM

## 2017-06-04 DIAGNOSIS — R072 Precordial pain: Secondary | ICD-10-CM

## 2017-06-04 DIAGNOSIS — R1031 Right lower quadrant pain: Secondary | ICD-10-CM

## 2017-06-04 DIAGNOSIS — M129 Arthropathy, unspecified: Secondary | ICD-10-CM

## 2017-06-04 DIAGNOSIS — K635 Polyp of colon: Secondary | ICD-10-CM | POA: Insufficient documentation

## 2017-06-04 DIAGNOSIS — R0602 Shortness of breath: Secondary | ICD-10-CM | POA: Insufficient documentation

## 2017-06-04 DIAGNOSIS — K5281 Eosinophilic gastritis or gastroenteritis: Secondary | ICD-10-CM

## 2017-06-04 DIAGNOSIS — R0989 Other specified symptoms and signs involving the circulatory and respiratory systems: Secondary | ICD-10-CM | POA: Insufficient documentation

## 2017-06-04 DIAGNOSIS — G4710 Hypersomnia, unspecified: Secondary | ICD-10-CM | POA: Insufficient documentation

## 2017-06-08 ENCOUNTER — Telehealth: Payer: Self-pay | Admitting: Cardiovascular Disease

## 2017-06-08 ENCOUNTER — Encounter: Payer: Self-pay | Admitting: Cardiovascular Disease

## 2017-06-08 ENCOUNTER — Ambulatory Visit: Payer: Medicaid Other | Admitting: Cardiovascular Disease

## 2017-06-08 DIAGNOSIS — E7800 Pure hypercholesterolemia, unspecified: Secondary | ICD-10-CM | POA: Diagnosis not present

## 2017-06-08 DIAGNOSIS — I10 Essential (primary) hypertension: Secondary | ICD-10-CM

## 2017-06-08 DIAGNOSIS — I2510 Atherosclerotic heart disease of native coronary artery without angina pectoris: Secondary | ICD-10-CM | POA: Diagnosis not present

## 2017-06-08 NOTE — Assessment & Plan Note (Signed)
History of essential hypertension blood pressure measured at 137/76 and she is on amlodipine, carvedilol and lisinopril. Continue current meds at current dosing

## 2017-06-08 NOTE — Assessment & Plan Note (Signed)
History of CAD status post inferior STEMI 04/05/17 at [facility] [place]. She had stenting of her dominant RCA with a 2.75 mm x 32 mm long drug-eluting stent. That time he did document high-grade obtuse marginal branch disease and moderate RCA disease. She did have mild to moderate LV dysfunction. I performed FFR of her proximal RCA and stenting of her circumflex obtuse marginal branch 05/27/17 using a 2.25 x 12 mm long synergy drug-eluting stent postdilated to 2.4 mm. Since discharge she says she feels clinically improved and no longer complains of chest pain.

## 2017-06-08 NOTE — Telephone Encounter (Signed)
Left message for Dahlia Clienthannah to re-fax information to dr berry. His primary does not know anything about it.

## 2017-06-08 NOTE — Addendum Note (Signed)
Addended by: Chana BodeGREEN, Charlsey Moragne L on: 06/08/2017 03:33 PM   Modules accepted: Orders

## 2017-06-08 NOTE — Progress Notes (Signed)
06/08/2017 [name]   [birth year]  [id number]  Primary [facility], [facility] Cardiologist: Runell Gess MD Milagros Loll, [place], [place]  HPI:  [name] is a 56 y.o.single African-American female mother of one daughter, [place] Records. She was referred by Arnette Felts [facility] for consideration of obtuse marginal branch intervention. I last saw her in the office 05/25/17. She has a history of 40-pack-years of tobacco abuse having quit on 04/05/17. She has history of diabetes, hypertension and hyperlipidemia. She had an inferior STEMI 04/05/17 at [facility] in [place]. She underwent cardiac catheterization through the right femoral approach on 04/06/17 and had a 2.75 x 32 mm long drug-eluting stent placed in her RCA. She had moderate proximal LAD disease and high-grade obtuse marginal branch disease with normal LV function. She remains on antiplatelet therapy. The intention was to perform staged intervention on the obtuse marginal branch which unfortunately could not occur because of insurance considerations. She presents underwent cardiac catheterization by myself as an outpatient 05/27/17 through the right femoral approach. I demonstrated a patent stent in her mid mid RCA and performed FFR of the proximal RCA which was 0.86 suggesting that this was not physiologic significant. I stented her first obtuse marginal branch ostium with a 2.25 mm x 12 mm long synergy drug-eluting stent post dilating up to 2.4 mm. Should have a moderate lesion in her mid LAD as well as inferior wall motion amount with otherwise preserved LV function and ejection fraction 45-50%. Since discharge she is remaining clinically improved and denies chest pain.   Current Meds  Medication Sig  . albuterol (PROAIR HFA) 108 (90 Base) MCG/ACT inhaler Inhale 2 puffs into the lungs every 6 (six) hours as needed for wheezing or shortness of breath.  Marland Kitchen amLODipine (NORVASC) 5 MG tablet Take 5 mg by  mouth daily.  Marland Kitchen aspirin EC 81 MG tablet Take 81 mg by mouth daily.  Marland Kitchen atorvastatin (LIPITOR) 20 MG tablet Take 20 mg by mouth daily.  . baclofen (LIORESAL) 20 MG tablet Take 20 mg by mouth 3 (three) times daily.  . carvedilol (COREG) 6.25 MG tablet Take 6.25 mg by mouth 2 (two) times daily.  . clopidogrel (PLAVIX) 75 MG tablet Take 1 tablet (75 mg total) by mouth daily.  Marland Kitchen HYDROcodone-acetaminophen (NORCO) 7.5-325 MG tablet Take 1 tablet by mouth 4 (four) times daily as needed for moderate pain.   Marland Kitchen levothyroxine (SYNTHROID, LEVOTHROID) 100 MCG tablet Take 100 mcg by mouth daily.  Marland Kitchen lisinopril (PRINIVIL,ZESTRIL) 20 MG tablet Take 40 mg by mouth daily.   . metFORMIN (GLUCOPHAGE) 1000 MG tablet Take 1,000 mg by mouth 2 (two) times daily.  . metoCLOPramide (REGLAN) 10 MG tablet Take 10 mg by mouth 2 (two) times daily.   . Multiple Vitamin (MULTIVITAMIN WITH MINERALS) TABS tablet Take 1 tablet by mouth daily.  . nitroGLYCERIN (NITROSTAT) 0.4 MG SL tablet Place 1 tablet (0.4 mg total) under the tongue every 5 (five) minutes as needed for chest pain.  . pantoprazole (PROTONIX) 40 MG tablet Take 1 tablet (40 mg total) by mouth daily.  . TRESIBA FLEXTOUCH 200 UNIT/ML SOPN Inject 25 Units as directed daily.     Allergies  Allergen Reactions  . Motrin [Ibuprofen] Hives  . Bactrim [Sulfamethoxazole-Trimethoprim] Rash  . Sulfa Antibiotics Hives and Rash    Social History   Socioeconomic History  . Marital status: Single    Spouse name: Not on file  . Number of children: Not on  file  . Years of education: Not on file  . Highest education level: Not on file  Social Needs  . Financial resource strain: Not on file  . Food insecurity - worry: Not on file  . Food insecurity - inability: Not on file  . Transportation needs - medical: Not on file  . Transportation needs - non-medical: Not on file  Occupational History  . Not on file  Tobacco Use  . Smoking status: Former Smoker    Types:  Cigarettes    Last attempt to quit: 04/05/2017    Years since quitting: 0.1  . Smokeless tobacco: Never Used  Substance and Sexual Activity  . Alcohol use: No  . Drug use: No  . Sexual activity: Not on file  Other Topics Concern  . Not on file  Social History Narrative  . Not on file     Review of Systems: General: negative for chills, fever, night sweats or weight changes.  Cardiovascular: negative for chest pain, dyspnea on exertion, edema, orthopnea, palpitations, paroxysmal nocturnal dyspnea or shortness of breath Dermatological: negative for rash Respiratory: negative for cough or wheezing Urologic: negative for hematuria Abdominal: negative for nausea, vomiting, diarrhea, bright red blood per rectum, melena, or hematemesis Neurologic: negative for visual changes, syncope, or dizziness All other systems reviewed and are otherwise negative except as noted above.    Blood pressure 137/76, pulse (!) 56, height 5\' 2"  (1.575 m), weight 143 lb 6.4 oz (65 kg).  General appearance: alert and no distress Neck: no adenopathy, no carotid bruit, no JVD, supple, symmetrical, trachea midline and thyroid not enlarged, symmetric, no tenderness/mass/nodules Lungs: clear to auscultation bilaterally Heart: regular rate and rhythm, S1, S2 normal, no murmur, click, rub or gallop Extremities: extremities normal, atraumatic, no cyanosis or edema Pulses: 2+ and symmetric Skin: Skin color, texture, turgor normal. No rashes or lesions Neurologic: Alert and oriented X 3, normal strength and tone. Normal symmetric reflexes. Normal coordination and gait  EKG sinus bradycardia 56 with inferolateral T-wave inversion and septal Q waves. I personally reviewed this EKG.  ASSESSMENT AND PLAN:   Coronary artery disease History of CAD status post inferior STEM[facility]05/[facility] [place]. She had stenting of her dominant RCA with a 2.75 mm x 32 mm long drug-eluting stent. That  time he did document high-grade obtuse marginal branch disease and moderate RCA disease. She did have mild to moderate LV dysfunction. I performed FFR of her proximal RCA and stenting of her circumflex obtuse marginal branch 05/27/17 using a 2.25 x 12 mm long synergy drug-eluting stent postdilated to 2.4 mm. Since discharge she says she feels clinically improved and no longer complains of chest pain.  Essential hypertension History of essential hypertension blood pressure measured at 137/76 and she is on amlodipine, carvedilol and lisinopril. Continue current meds at current dosing  Hyperlipidemia History of hyperlipidemia on statin therapy followed by her PCP      Runell GessJonathan J. Elorah Dewing MD [facility],FACC,FAHA, [facility] 06/08/2017 12:11 PM

## 2017-06-08 NOTE — Assessment & Plan Note (Signed)
History of hyperlipidemia on statin therapy followed by her PCP. 

## 2017-06-08 NOTE — Telephone Encounter (Signed)
NEw Message  Hannah from [facility] call requesting to speak with Rn to f/u on fax for medication reemburement sent on 10/12. please call back to discuss

## 2017-06-08 NOTE — Patient Instructions (Signed)
Medication Instructions: Your physician recommends that you continue on your current medications as directed. Please refer to the Current Medication list given to you today.   Follow-Up: Your physician wants you to follow-up as needed with Dr. Allyson SabalBerry.  If you need a refill on your cardiac medications before your next appointment, please call your pharmacy.

## 2017-06-23 ENCOUNTER — Telehealth: Payer: Self-pay | Admitting: Cardiovascular Disease

## 2017-06-23 NOTE — Telephone Encounter (Signed)
New Message  Dahlia [facility]Hannah [facility] call requesting to speak with RN to f/u on mediation reimbursement form faxed to Dr. Allyson SabalBerry. Please call back to discuss

## 2017-06-23 NOTE — Telephone Encounter (Signed)
Sp[facility] to [place] [facility]. She needs whole [facility] out [facility], states this was sent over to us on Oct 29th to [facility] fax. Please contact her if problems.

## 2017-07-02 ENCOUNTER — Telehealth: Payer: Self-pay | Admitting: Cardiovascular Disease

## 2017-07-02 NOTE — Telephone Encounter (Signed)
New message     Call from cardiac rehab requesting Medicaid reimbursement form be faxed.  [facility]ahlia ClientHannah at [facility]

## 2017-07-28 NOTE — Telephone Encounter (Signed)
New message      Checking status of the medicaid reimbursement forms, have they been signed yet by Dr Allyson SabalBerry ?

## 2017-08-02 NOTE — Telephone Encounter (Signed)
Reimbursement forms received. Will have Dr. Allyson SabalBerry sign when he returns.

## 2017-09-07 ENCOUNTER — Other Ambulatory Visit: Payer: Self-pay | Admitting: Family Medicine

## 2017-09-07 DIAGNOSIS — Z139 Encounter for screening, unspecified: Secondary | ICD-10-CM

## 2017-09-13 ENCOUNTER — Ambulatory Visit: Payer: Medicaid Other | Admitting: Neurology

## 2017-09-15 ENCOUNTER — Telehealth: Payer: Self-pay | Admitting: Cardiovascular Disease

## 2017-09-15 NOTE — Telephone Encounter (Signed)
Debra ClientHannah calling with Cardiac Rehab,  States that she faxed over an medicaid reimbursment form on 07-23-17 to be signed and is calling for update.  Fax # [phone]

## 2017-09-17 NOTE — Telephone Encounter (Signed)
Reimbursement form was previously signed by Dr. Allyson SabalBerry and will be faxed today.

## 2017-10-13 ENCOUNTER — Encounter: Payer: Self-pay | Admitting: *Deleted

## 2017-10-14 ENCOUNTER — Encounter: Payer: Self-pay | Admitting: Neurology

## 2017-10-14 ENCOUNTER — Ambulatory Visit: Payer: Medicaid Other | Admitting: Neurology

## 2017-10-14 VITALS — BP 132/75 | HR 73 | Ht 62.0 in | Wt 148.0 lb

## 2017-10-14 DIAGNOSIS — R202 Paresthesia of skin: Secondary | ICD-10-CM | POA: Diagnosis not present

## 2017-10-14 DIAGNOSIS — E538 Deficiency of other specified B group vitamins: Secondary | ICD-10-CM

## 2017-10-14 MED ORDER — DULOXETINE HCL 30 MG PO CPEP: 30.0000 mg | ORAL_CAPSULE | Freq: Two times a day (BID) | ORAL | 3 refills | Status: DC

## 2017-10-14 NOTE — Progress Notes (Signed)
Reason for visit: Possible peripheral neuropathy  Referring physician: Dr. Rogelia RohrerSun  [name] is a 57 y.o. female  History of present illness:  [name] is a 57 year old right-handed black female with a history of diabetes.  The patient indicates that she began having some discomfort all throughout her body over the last 3 years.  She was diagnosed with diabetes 4 years ago.  The patient now complains of discomfort in her neck, low back, both arms and both legs.  The patient has been told she has arthritis involving her right shoulder, she has difficulty elevating the right arm above 90 degrees because of pain, she has had blood work done to include an ANA and rheumatoid factor that were negative.  The patient has numbness from the shoulder level down to the hand on the left, she has numbness in the right hand as well and pain from the neck area all the way down the right arm.  The patient also reports low back pain, pain into the left buttock and occasionally down the left leg.  She has numbness in both feet, some bilateral knee pain and crepitus in the knees, it hurts her to ambulate.  The patient uses a cane for ambulation, she does believe that there has been some balance issues.  She does have urinary frequency, she indicates that her bowels work fairly well.  She is not sleeping well secondary to the pain, she naps frequently at night and during the day.  In the past she has taken gabapentin but this offered no benefit, Flexeril did not help.  The patient is on chronic daily doses of hydrocodone currently.  The patient is sent to this office for an evaluation.  The patient does feel weak to some degree all over, she will drop things from her hands on occasion.  Past Medical History:  Diagnosis Date  . Abdominal pain, right lower quadrant   . Abdominal wall pain   . Arterial bruit   . Arthritis   . Arthropathy   . Barrett's esophagus   . Callus   . Carotid stenosis, bilateral   . Chest  pain in adult   . Chronic back pain   . Colon polyp   . Coronary artery disease, occlusive   . Eosinophilic esophagitis   . Eosinophilic gastritis   . Fatigue   . Gastritis   . Gastroesophagitis   . H. pylori infection   . H/O: hysterectomy   . Hypersomnia   . IBS (irritable bowel syndrome)   . Inflammatory bowel disease    colitis  . Inlet patch of esophagus   . Precordial pain   . PVD (peripheral vascular disease) (HCC)   . Restless leg syndrome   . Right foot pain   . Shortness of breath on exertion   . Shoulder pain, right   . Ulcer of esophagus without bleeding     Past Surgical History:  Procedure Laterality Date  . AUGMENTATION MAMMAPLASTY Bilateral    breast lift  . BREAST EXCISIONAL BIOPSY Right   . CORONARY STENT INTERVENTION N/A 05/27/2017   Procedure: CORONARY STENT INTERVENTION;  Surgeon: Runell GessBerry, Jonathan J, MD;  [facility] IN[facility];  Service: Cardiovascular;  Laterality: N/A;  . FOOT SURGERY Left   . INTRAVASCULAR PRESSURE WIRE/FFR STUDY N/A 05/27/2017   Procedure: INTRAVASCULAR PRESSURE WIRE/FFR STUDY;  Surgeon: Runell GessBerry, Jonathan J, MD;  Location: [facility];  Service: Cardiovascular;  Laterality: N/A;  . LEFT HEART CATH AND  CORONARY ANGIOGRAPHY N/A 05/27/2017   Procedure: LEFT HEART CATH AND CORONARY ANGIOGRAPHY;  Surgeon: Runell Gess, MD;  Location: [facility] IN[facility] LAB;  Service: Cardiovascular;  Laterality: N/A;  . THYROIDECTOMY      Family History  Problem Relation Age of Onset  . Hyperlipidemia Mother   . Hypertension Mother   . Diabetes Mellitus II Mother   . Cancer Father   . Hypertension Sister   . Hypertension Brother   . Hypertension Brother   . Hypertension Brother   . Hypertension Sister   . Hypertension Sister   . Heart disease Maternal Aunt     Social history:  reports that she has been smoking cigarettes.  she has never used smokeless tobacco. She reports that she does not drink alcohol or use  drugs.  Medications:  Prior to Admission medications   Medication Sig Start Date End Date Taking? Authorizing Provider  albuterol (PROAIR HFA) 108 (90 Base) MCG/ACT inhaler Inhale 2 puffs into the lungs every 6 (six) hours as needed for wheezing or shortness of breath.   Yes [provider]  amLODipine (NORVASC) 5 MG tablet Take 5 mg by mouth daily. 05/12/17  Yes [provider]  aspirin EC 81 MG tablet Take 81 mg by mouth daily.   Yes [provider]  atorvastatin (LIPITOR) 20 MG tablet Take 20 mg by mouth daily. 05/06/17  Yes [provider]  baclofen (LIORESAL) 20 MG tablet Take 20 mg by mouth 3 (three) times daily. 05/12/17  Yes [provider]  carvedilol (COREG) 6.25 MG tablet Take 6.25 mg by mouth 2 (two) times daily. 04/09/17  Yes [provider]  clopidogrel (PLAVIX) 75 MG tablet Take 1 tablet (75 mg total) by mouth daily. 05/29/17  Yes Bhagat, Bhavinkumar, PA  HYDROcodone-acetaminophen (NORCO) 7.5-325 MG tablet Take 1 tablet by mouth 4 (four) times daily as needed for moderate pain.  05/07/17  Yes [provider]  levothyroxine (SYNTHROID, LEVOTHROID) 100 MCG tablet Take 100 mcg by mouth daily.   Yes [provider]  lisinopril (PRINIVIL,ZESTRIL) 20 MG tablet Take 40 mg by mouth daily.    Yes [provider]  metFORMIN (GLUCOPHAGE) 1000 MG tablet Take 1,000 mg by mouth 2 (two) times daily. 05/06/17  Yes [provider]  metoCLOPramide (REGLAN) 10 MG tablet Take 10 mg by mouth 2 (two) times daily.    Yes [provider]  Multiple Vitamin (MULTIVITAMIN WITH MINERALS) TABS tablet Take 1 tablet by mouth daily.   Yes [provider]  nitroGLYCERIN (NITROSTAT) 0.4 MG SL tablet Place 1 tablet (0.4 mg total) under the tongue every 5 (five) minutes as needed for chest pain. 05/28/17  Yes Bhagat, Bhavinkumar, PA  pantoprazole (PROTONIX) 40 MG tablet Take 1 tablet (40 mg total) by mouth daily.  05/29/17  Yes Bhagat, Bhavinkumar, PA  TRESIBA FLEXTOUCH 200 UNIT/ML SOPN Inject 25 Units as directed daily. 05/06/17  Yes [provider]  DULoxetine (CYMBALTA) 30 MG capsule Take 1 capsule (30 mg total) by mouth 2 (two) times daily. 10/14/17   York Spaniel, MD      Allergies  Allergen Reactions  . Motrin [Ibuprofen] Hives  . Bactrim [Sulfamethoxazole-Trimethoprim] Rash  . Sulfa Antibiotics Hives and Rash    ROS:  Out of a complete 14 system review of symptoms, the patient complains only of the following symptoms, and all other reviewed systems are negative.  Numbness Muscle pain  Blood pressure 132/75, pulse 73, height 5\' 2"  (1.575 m),  weight 148 lb (67.1 kg).  Physical Exam  General: The patient is alert and cooperative at the time of the examination.  Eyes: Pupils are equal, round, and reactive to light. Discs are flat bilaterally.  Neck: The neck is supple, no carotid bruits are noted.  Respiratory: The respiratory examination is clear.  Cardiovascular: The cardiovascular examination reveals a regular rate and rhythm, no obvious murmurs or rubs are noted.  Neuromuscular: The patient lacks about 20 degrees of full lateral rotation of the cervical spine bilaterally.  The patient has relatively full range of movement of the low back.  Skin: Extremities are without significant edema.  Neurologic Exam  Mental status: The patient is alert and oriented x 3 at the time of the examination. The patient has apparent normal recent and remote memory, with an apparently normal attention span and concentration ability.  Cranial nerves: Facial symmetry is present. There is good sensation of the face to pinprick and soft touch bilaterally. The strength of the facial muscles and the muscles to head turning and shoulder shrug are normal bilaterally. Speech is well enunciated, no aphasia or dysarthria is noted. Extraocular movements are full. Visual fields are full. The tongue  is midline, and the patient has symmetric elevation of the soft palate. No obvious hearing deficits are noted.  Motor: The motor testing reveals 5 over 5 strength of all 4 extremities. Good symmetric motor tone is noted throughout.  Sensory: Sensory testing is intact to pinprick, soft touch, vibration sensation, and position sense on the upper extremities.  With the lower extremities, there is a stocking pattern pinprick sensory deficit 2/3 way up the legs bilaterally, vibration sensation and position sensation are intact bilaterally.  No evidence of extinction is noted.  Coordination: Cerebellar testing reveals good finger-nose-finger and heel-to-shin bilaterally.  Tinel sign at the wrists are mildly positive bilaterally  Gait and station: Gait is associated with a limping type gait on the left leg. Tandem gait is minimally unsteady. Romberg is negative. No drift is seen.  Reflexes: Deep tendon reflexes are symmetric and normal bilaterally, with exception of a depression of the left ankle jerk reflex, it is present on the right. Toes are downgoing bilaterally.   Assessment/Plan:  1.  Diffuse neuromuscular pain, possible fibromyalgia  2.  Probable intrinsic right shoulder disease, arthritis  3.  Possible peripheral neuropathy  The patient does have diabetes, but the clinical examination suggests that the severity of the peripheral neuropathy is likely to be very mild.  The neuropathy likely does not explain the diffuse neuromuscular pain symptoms that she is having.  The patient could have a fibromyalgia syndrome.  She will be set up for blood work today, she will have nerve conduction studies done on all 4 extremities, she will have EMG on the right arm and left leg.  The patient will be placed on Cymbalta for discomfort.  She will follow-up in 4 months, but she will come back sooner for the EMG evaluation.  We may consider MRI evaluation of the cervical spine and lumbar spine in the  future.  Marlan Palau MD 10/14/2017 9:48 AM  [facility] [address] [place], [place] [address]  Phone [phone] Fax [phone]

## 2017-10-14 NOTE — Patient Instructions (Signed)
   We will start Cymbalta 30 mg a day for 2 weeks, then take 30 mg twice a day.  We will get EMG and NCV study to look at the nerve function of the arms and legs.

## 2017-10-21 ENCOUNTER — Telehealth: Payer: Self-pay | Admitting: *Deleted

## 2017-10-21 LAB — LYME, WESTERN BLOT, SERUM (REFLEXED)
IGG P18 AB.: ABSENT
IGG P23 AB.: ABSENT
IGG P30 AB.: ABSENT
IGG P39 AB.: ABSENT
IGG P41 AB.: ABSENT
IGG P93 AB.: ABSENT
IGM P39 AB.: ABSENT
IGM P41 AB.: ABSENT
IgG P28 Ab.: ABSENT
IgG P45 Ab.: ABSENT
IgG P58 Ab.: ABSENT
IgG P66 Ab.: ABSENT
IgM P23 Ab.: ABSENT
Lyme IgG Wb: NEGATIVE
Lyme IgM Wb: NEGATIVE

## 2017-10-21 LAB — MULTIPLE MYELOMA PANEL, SERUM
ALBUMIN SERPL ELPH-MCNC: 3.3 g/dL (ref 2.9–4.4)
ALPHA 1: 0.3 g/dL (ref 0.0–0.4)
Albumin/Glob SerPl: 0.9 (ref 0.7–1.7)
Alpha2 Glob SerPl Elph-Mcnc: 0.7 g/dL (ref 0.4–1.0)
B-Globulin SerPl Elph-Mcnc: 1.3 g/dL (ref 0.7–1.3)
GLOBULIN, TOTAL: 3.8 g/dL (ref 2.2–3.9)
Gamma Glob SerPl Elph-Mcnc: 1.5 g/dL (ref 0.4–1.8)
IGA/IMMUNOGLOBULIN A, SERUM: 538 mg/dL — AB (ref 87–352)
IGG (IMMUNOGLOBIN G), SERUM: 1599 mg/dL (ref 700–1600)
IgM (Immunoglobulin M), Srm: 42 mg/dL (ref 26–217)
TOTAL PROTEIN: 7.1 g/dL (ref 6.0–8.5)

## 2017-10-21 LAB — VITAMIN B12: Vitamin B-12: 753 pg/mL (ref 232–1245)

## 2017-10-21 LAB — ANGIOTENSIN CONVERTING ENZYME: Angio Convert Enzyme: <15 U/L (ref 14–82)

## 2017-10-21 LAB — SEDIMENTATION RATE: SED RATE: 30 mm/h (ref 0–40)

## 2017-10-21 LAB — B. BURGDORFI ANTIBODIES: LYME IGG/IGM AB: 1.02 {ISR} — AB (ref 0.00–0.90)

## 2017-10-21 NOTE — Telephone Encounter (Signed)
Called and spoke with patient about unremarkable labs per CW,MD note. She verbalized understanding.  Reminded her of EMG/NCS appt on 11/08/17 check in 12:15pm.

## 2017-10-21 NOTE — Telephone Encounter (Signed)
-----   Message from York Spanielharles K Willis, MD sent at 10/21/2017  1:10 PM EDT -----  The blood work results are unremarkable. Please call the patient.  ----- Message ----- From: Nell RangeInterface, [facility] Lab Results In Sent: 10/15/2017   7:45 AM To: York Spanielharles K Willis, MD

## 2017-11-08 ENCOUNTER — Telehealth (HOSPITAL_COMMUNITY): Payer: Self-pay | Admitting: Pharmacist

## 2017-11-08 ENCOUNTER — Telehealth: Payer: Self-pay | Admitting: Neurology

## 2017-11-08 ENCOUNTER — Encounter: Payer: Medicaid Other | Admitting: Neurology

## 2017-11-08 NOTE — Telephone Encounter (Signed)
This patient did not show for an EMG and nerve conduction study appointment today. 

## 2017-11-09 ENCOUNTER — Encounter: Payer: Self-pay | Admitting: Neurology

## 2017-11-09 ENCOUNTER — Encounter (HOSPITAL_COMMUNITY)
Admission: RE | Admit: 2017-11-09 | Discharge: 2017-11-09 | Disposition: A | Discharge status: Home / Self Care | Payer: Medicaid Other | Source: Ambulatory Visit | Attending: Cardiovascular Disease | Admitting: Cardiovascular Disease

## 2017-11-09 ENCOUNTER — Encounter (HOSPITAL_COMMUNITY): Payer: Self-pay

## 2017-11-09 VITALS — Ht 62.75 in | Wt 142.7 lb

## 2017-11-09 DIAGNOSIS — F17210 Nicotine dependence, cigarettes, uncomplicated: Secondary | ICD-10-CM | POA: Insufficient documentation

## 2017-11-09 DIAGNOSIS — Z79891 Long term (current) use of opiate analgesic: Secondary | ICD-10-CM | POA: Diagnosis not present

## 2017-11-09 DIAGNOSIS — Z48812 Encounter for surgical aftercare following surgery on the circulatory system: Secondary | ICD-10-CM | POA: Insufficient documentation

## 2017-11-09 DIAGNOSIS — Z7984 Long term (current) use of oral hypoglycemic drugs: Secondary | ICD-10-CM | POA: Insufficient documentation

## 2017-11-09 DIAGNOSIS — Z955 Presence of coronary angioplasty implant and graft: Secondary | ICD-10-CM

## 2017-11-09 DIAGNOSIS — Z79899 Other long term (current) drug therapy: Secondary | ICD-10-CM | POA: Insufficient documentation

## 2017-11-09 NOTE — Progress Notes (Signed)
[name] 57 y.o. female DOB: 1961-04-20 MRN: [id number]      Nutrition Note  1. Stented coronary artery    Past Medical History:  Diagnosis Date  . Abdominal pain, right lower quadrant   . Abdominal wall pain   . Arterial bruit   . Arthritis   . Arthropathy   . Barrett's esophagus   . Callus   . Carotid stenosis, bilateral   . Chest pain in adult   . Chronic back pain   . Colon polyp   . Coronary artery disease, occlusive   . Eosinophilic esophagitis   . Eosinophilic gastritis   . Fatigue   . Gastritis   . Gastroesophagitis   . H. pylori infection   . H/O: hysterectomy   . Hypersomnia   . IBS (irritable bowel syndrome)   . Inflammatory bowel disease    colitis  . Inlet patch of esophagus   . Precordial pain   . PVD (peripheral vascular disease) (HCC)   . Restless leg syndrome   . Right foot pain   . Shortness of breath on exertion   . Shoulder pain, right   . Ulcer of esophagus without bleeding    Meds reviewed. Metformin, Tresiba, Reglan noted  HT: Ht Readings from Last 1 Encounters:  10/14/17 5\' 2"  (1.575 m)    WT: Wt Readings from Last 3 Encounters:  10/14/17 148 lb (67.1 kg)  06/08/17 143 lb 6.4 oz (65 kg)  05/27/17 140 lb (63.5 kg)     BMI 27.05   Current tobacco use? Yes       Labs:  Lipid Panel  No results found for: CHOL, TRIG, HDL, CHOLHDL, VLDL, LDLCALC, LDLDIRECT  No results found for: HGBA1C CBG (last 3)  No results for input(s): GLUCAP in the last 72 hours.  Nutrition Note Spoke with pt. Nutrition plan and goals reviewed with pt. Pt is following Step 1 of the Therapeutic Lifestyle Changes diet. Pt is diabetic. No recent A1c noted. Per pt, last A1c was "around 7.5." Pt reports her MD "just doesn't want my A1c to be in the 8's." Pt typically checks CBG's 2 times a day, but pt is currently out of test strips. Pt gets strips from [employer]. Cost-effective meter and strips discussed. Pt expressed understanding of the information reviewed. Pt  aware of nutrition education classes offered.  Nutrition Diagnosis ? Food-and nutrition-related knowledge deficit related to lack of exposure to information as related to diagnosis of: ? CVD ? DM   Nutrition Intervention ? Will discuss vitamin C supplementation with pt due to current smoker status. ? Pt's individual nutrition plan and goals reviewed with pt.  Nutrition Goal(s):  ? Improved blood glucose control as evidenced by pt's A1c trending from 7.5 toward less than 7.0.  Plan:  Pt to attend nutrition classes ? Nutrition I ? Nutrition II ? Portion Distortion ? Diabetes Blitz ? Diabetes Q & A Will provide client-centered nutrition education as part of interdisciplinary care.   Monitor and evaluate progress toward nutrition goal with team.  Debra PlumbEdna Shayley Salas, M.Ed, RD, LDN, CDE 11/09/2017 11:02 AM

## 2017-11-09 NOTE — Telephone Encounter (Signed)
Cardiac Rehab - Pharmacy Resident Documentation   Patient unable to be reached after three call attempts. Please complete allergy verification and medication review during patient's cardiac rehab appointment.    Mehar Kirkwood, PharmD PGY1 Pharmacy Resident Email: [email or web address] Pager: [phone]  

## 2017-11-09 NOTE — Progress Notes (Signed)
Cardiac Individual Treatment Plan  Patient Details  Name: [name] MRN: [id number] Date of Birth: 12/29/1960 Referring Provider:   Flowsheet Row CARDIAC REHAB PHASE II ORIENTATION from 11/09/2017 in [facility] CARDIAC REHAB  Referring Provider  Nanetta Batty, MD      Initial Encounter Date:  Flowsheet Row CARDIAC REHAB PHASE II ORIENTATION from 11/09/2017 in [facility] CARDIAC REHAB  Date  11/09/17  Referring Provider  Nanetta Batty, MD      Visit Diagnosis: Stented coronary artery  Patient's Home Medications on Admission:  Current Outpatient Medications:  .  albuterol (PROAIR HFA) 108 (90 Base) MCG/ACT inhaler, Inhale 2 puffs into the lungs every 6 (six) hours as needed for wheezing or shortness of breath., Disp: , Rfl:  .  amLODipine (NORVASC) 5 MG tablet, Take 5 mg by mouth daily., Disp: , Rfl: 2 .  aspirin EC 81 MG tablet, Take 81 mg by mouth daily., Disp: , Rfl:  .  atorvastatin (LIPITOR) 20 MG tablet, Take 20 mg by mouth daily., Disp: , Rfl: 2 .  baclofen (LIORESAL) 20 MG tablet, Take 20 mg by mouth 3 (three) times daily., Disp: , Rfl: 2 .  carvedilol (COREG) 6.25 MG tablet, Take 6.25 mg by mouth 2 (two) times daily., Disp: , Rfl: 0 .  clopidogrel (PLAVIX) 75 MG tablet, Take 1 tablet (75 mg total) by mouth daily., Disp: 30 tablet, Rfl: 11 .  DULoxetine (CYMBALTA) 30 MG capsule, Take 1 capsule (30 mg total) by mouth 2 (two) times daily., Disp: 60 capsule, Rfl: 3 .  HYDROcodone-acetaminophen (NORCO) 7.5-325 MG tablet, Take 1 tablet by mouth 4 (four) times daily as needed for moderate pain. , Disp: , Rfl: 0 .  levothyroxine (SYNTHROID, LEVOTHROID) 100 MCG tablet, Take 100 mcg by mouth daily., Disp: , Rfl:  .  lisinopril (PRINIVIL,ZESTRIL) 20 MG tablet, Take 40 mg by mouth daily. , Disp: , Rfl:  .  metFORMIN (GLUCOPHAGE) 1000 MG tablet, Take 1,000 mg by mouth 2 (two) times daily., Disp: , Rfl: 3 .  metoCLOPramide (REGLAN) 10 MG tablet, Take  10 mg by mouth 2 (two) times daily. , Disp: , Rfl:  .  Multiple Vitamin (MULTIVITAMIN WITH MINERALS) TABS tablet, Take 1 tablet by mouth daily., Disp: , Rfl:  .  nitroGLYCERIN (NITROSTAT) 0.4 MG SL tablet, Place 1 tablet (0.4 mg total) under the tongue every 5 (five) minutes as needed for chest pain., Disp: 25 tablet, Rfl: 12 .  pantoprazole (PROTONIX) 40 MG tablet, Take 1 tablet (40 mg total) by mouth daily., Disp: 30 tablet, Rfl: 11 .  TRESIBA FLEXTOUCH 200 UNIT/ML SOPN, Inject 25 Units as directed daily., Disp: , Rfl: 2  Past Medical History: Past Medical History:  Diagnosis Date  . Abdominal pain, right lower quadrant   . Abdominal wall pain   . Arterial bruit   . Arthritis   . Arthropathy   . Barrett's esophagus   . Callus   . Carotid stenosis, bilateral   . Chest pain in adult   . Chronic back pain   . Colon polyp   . Coronary artery disease, occlusive   . Eosinophilic esophagitis   . Eosinophilic gastritis   . Fatigue   . Gastritis   . Gastroesophagitis   . H. pylori infection   . H/O: hysterectomy   . Hypersomnia   . IBS (irritable bowel syndrome)   . Inflammatory bowel disease    colitis  . Inlet patch of esophagus   .  Precordial pain   . PVD (peripheral vascular disease) (HCC)   . Restless leg syndrome   . Right foot pain   . Shortness of breath on exertion   . Shoulder pain, right   . Ulcer of esophagus without bleeding     Tobacco Use: Social History   Tobacco Use  Smoking Status Current Every Day Smoker  . Types: Cigarettes  . Last attempt to quit: 04/05/2017  . Years since quitting: 0.5  Smokeless Tobacco Never Used    Labs: Recent Review Flowsheet Data    There is no flowsheet data to display.      Capillary Blood Glucose: Lab Results  Component Value Date   GLUCAP 298 (H) 05/28/2017   GLUCAP 115 (H) 05/27/2017   GLUCAP 200 (H) 05/27/2017     Exercise Target Goals: Date: 11/09/17  Exercise Program Goal: Individual exercise  prescription set using results from initial 6 min walk test and THRR while considering  patient's activity barriers and safety.   Exercise Prescription Goal: Initial exercise prescription builds to 30-45 minutes a day of aerobic activity, 2-3 days per week.  Home exercise guidelines will be given to patient during program as part of exercise prescription that the participant will acknowledge.  Activity Barriers & Risk Stratification: Activity Barriers & Cardiac Risk Stratification - 11/09/17 0924    Activity Barriers & Cardiac Risk Stratification          Activity Barriers  Back Problems;Joint Problems;Deconditioning;Muscular Weakness;Other (comment);Assistive Device;History of Falls;Balance Concerns    Comments  R sided shoulder and lower neck region pain/discomfort; B knee pain    Cardiac Risk Stratification  High           6 Minute Walk: 6 Minute Walk    6 Minute Walk    Row Name 11/09/17 1214   Phase  Initial   Distance  1109 feet   Walk Time  6 minutes   # of Rest Breaks  0   MPH  2.1   METS  3.2   RPE  12   Symptoms  Yes (comment)   Comments  L leg fatigue   Resting HR  70 bpm   Resting BP  128/66   Resting Oxygen Saturation   96 %   Exercise Oxygen Saturation  during 6 min walk  95 %   Max Ex. HR  88 bpm   Max Ex. BP  138/72   2 Minute Post BP  118/60          Oxygen Initial Assessment:   Oxygen Re-Evaluation:   Oxygen Discharge (Final Oxygen Re-Evaluation):   Initial Exercise Prescription: Initial Exercise Prescription - 11/09/17 1200    Date of Initial Exercise RX and Referring Provider          Date  11/09/17    Referring Provider  Nanetta Batty, MD        Recumbant Bike          Level  2    Minutes  10    METs  1.2        NuStep          Level  2    SPM  70    Minutes  10    METs  2        Track          Laps  8    Minutes  10    METs  2.34        Prescription  Details          Frequency (times per week)  3    Duration   Progress to 30 minutes of continuous aerobic without signs/symptoms of physical distress        Intensity          THRR 40-80% of Max Heartrate  65-130    Ratings of Perceived Exertion  11-13    Perceived Dyspnea  0-4        Progression          Progression  Continue to progress workloads to maintain intensity without signs/symptoms of physical distress.        Resistance Training          Training Prescription  Yes    Weight  2lbs    Reps  10-15           Perform Capillary Blood Glucose checks as needed.  Exercise Prescription Changes:   Exercise Comments:   Exercise Goals and Review: Exercise Goals    Exercise Goals    Row Name 11/09/17 0925   Increase Physical Activity  Yes   Intervention  Provide advice, education, support and counseling about physical activity/exercise needs.;Develop an individualized exercise prescription for aerobic and resistive training based on initial evaluation findings, risk stratification, comorbidities and participant's personal goals.   Expected Outcomes  Short Term: Attend rehab on a regular basis to increase amount of physical activity.;Long Term: Add in home exercise to make exercise part of routine and to increase amount of physical activity.;Long Term: Exercising regularly at least 3-5 days a week.   Increase Strength and Stamina  Yes   Intervention  Provide advice, education, support and counseling about physical activity/exercise needs.;Develop an individualized exercise prescription for aerobic and resistive training based on initial evaluation findings, risk stratification, comorbidities and participant's personal goals.   Expected Outcomes  Short Term: Increase workloads from initial exercise prescription for resistance, speed, and METs.;Short Term: Perform resistance training exercises routinely during rehab and add in resistance training at home;Long Term: Improve cardiorespiratory fitness, muscular endurance and strength as  measured by increased METs and functional capacity ( )   Able to understand and use rate of perceived exertion (RPE) scale  Yes   Intervention  Provide education and explanation on how to use RPE scale   Expected Outcomes  Short Term: Able to use RPE daily in rehab to express subjective intensity level;Long Term:  Able to use RPE to guide intensity level when exercising independently   Knowledge and understanding of Target Heart Rate Range (THRR)  Yes   Intervention  Provide education and explanation of THRR including how the numbers were predicted and where they are located for reference   Expected Outcomes  Short Term: Able to state/look up THRR;Long Term: Able to use THRR to govern intensity when exercising independently;Short Term: Able to use daily as guideline for intensity in rehab   Able to check pulse independently  Yes   Intervention  Provide education and demonstration on how to check pulse in carotid and radial arteries.;Review the importance of being able to check your own pulse for safety during independent exercise   Expected Outcomes  Short Term: Able to explain why pulse checking is important during independent exercise;Long Term: Able to check pulse independently and accurately   Understanding of Exercise Prescription  Yes   Intervention  Provide education, explanation, and written materials on patient's individual exercise prescription   Expected Outcomes  Short Term: Able to explain program exercise  prescription;Long Term: Able to explain home exercise prescription to exercise independently          Exercise Goals Re-Evaluation :    Discharge Exercise Prescription (Final Exercise Prescription Changes):   Nutrition:  Target Goals: Understanding of nutrition guidelines, daily intake of sodium 1500mg , cholesterol 200mg , calories 30% from fat and 7% or less from saturated fats, daily to have 5 or more servings of fruits and vegetables.  Biometrics: Pre Biometrics -  11/09/17 1223    Pre Biometrics          Height  5' 2.75" (1.594 m)    Weight  142 lb 12 oz (64.8 kg)    Waist Circumference  37 inches    Hip Circumference  40 inches    Waist to Hip Ratio  0.92 %    BMI (Calculated)  25.48    Triceps Skinfold  19 mm    % Body Fat  26.5 %    Grip Strength  29 kg    Flexibility  9 in    Single Leg Stand  2 seconds            Nutrition Therapy Plan and Nutrition Goals: Nutrition Therapy & Goals - 11/09/17 1110    Nutrition Therapy          Diet  Carb Modified, Heart Healthy        Personal Nutrition Goals          Nutrition Goal  Improve glycemic control as evidenced by pt's A1c trending from 7.5 to less than 7.0        Intervention Plan          Intervention  Prescribe, educate and counsel regarding individualized specific dietary modifications aiming towards targeted core components such as weight, hypertension, lipid management, diabetes, heart failure and other comorbidities.    Expected Outcomes  Short Term Goal: Understand basic principles of dietary content, such as calories, fat, sodium, cholesterol and nutrients.;Long Term Goal: Adherence to prescribed nutrition plan.           Nutrition Assessments: Nutrition Assessments - 11/09/17 1111    MEDFICTS Scores          Pre Score  53           Nutrition Goals Re-Evaluation:   Nutrition Goals Re-Evaluation:   Nutrition Goals Discharge (Final Nutrition Goals Re-Evaluation):   Psychosocial: Target Goals: Acknowledge presence or absence of significant depression and/or stress, maximize coping skills, provide positive support system. Participant is able to verbalize types and ability to use techniques and skills needed for reducing stress and depression.  Initial Review & Psychosocial Screening: Initial Psych Review & Screening - 11/09/17 1141    Initial Review          Current issues with  None Identified        Family Dynamics          Good Support System?  Yes  daughter         Barriers          Psychosocial barriers to participate in program  There are no identifiable barriers or psychosocial needs.        Screening Interventions          Interventions  Encouraged to exercise           Quality of Life Scores: Quality of Life - 11/09/17 1141    Quality of Life Scores          Health/Function Pre  21.9 %  Socioeconomic Pre  25.38 %    Psych/Spiritual Pre  30 %    Family Pre  26.4 %    GLOBAL Pre  24.97 %          Scores of 19 and below usually indicate a poorer quality of life in these areas.  A difference of  2-3 points is a clinically meaningful difference.  A difference of 2-3 points in the total score of the Quality of Life Index has been associated with significant improvement in overall quality of life, self-image, physical symptoms, and general health in studies assessing change in quality of life.  PHQ-9: Recent Review Flowsheet Data    There is no flowsheet data to display.     Interpretation of Total Score  Total Score Depression Severity:  1-4 = Minimal depression, 5-9 = Mild depression, 10-14 = Moderate depression, 15-19 = Moderately severe depression, 20-27 = Severe depression   Psychosocial Evaluation and Intervention:   Psychosocial Re-Evaluation:   Psychosocial Discharge (Final Psychosocial Re-Evaluation):   Vocational Rehabilitation: Provide vocational rehab assistance to qualifying candidates.   Vocational Rehab Evaluation & Intervention: Vocational Rehab - 11/09/17 1141    Initial Vocational Rehab Evaluation & Intervention          Assessment shows need for Vocational Rehabilitation  No           Education: Education Goals: Education classes will be provided on a weekly basis, covering required topics. Participant will state understanding/return demonstration of topics presented.  Learning Barriers/Preferences: Learning Barriers/Preferences - 11/09/17 [id number]    Learning  Barriers/Preferences          Learning Barriers  Sight    Learning Preferences  Written Material           Education Topics: Count Your Pulse:  -Group instruction provided by verbal instruction, demonstration, patient participation and written materials to support subject.  Instructors address importance of being able to find your pulse and how to count your pulse when at home without a heart monitor.  Patients get hands on experience counting their pulse with staff help and individually.   Heart Attack, Angina, and Risk Factor Modification:  -Group instruction provided by verbal instruction, video, and written materials to support subject.  Instructors address signs and symptoms of angina and heart attacks.    Also discuss risk factors for heart disease and how to make changes to improve heart health risk factors.   Functional Fitness:  -Group instruction provided by verbal instruction, demonstration, patient participation, and written materials to support subject.  Instructors address safety measures for doing things around the house.  Discuss how to get up and down off the floor, how to pick things up properly, how to safely get out of a chair without assistance, and balance training.   Meditation and Mindfulness:  -Group instruction provided by verbal instruction, patient participation, and written materials to support subject.  Instructor addresses importance of mindfulness and meditation practice to help reduce stress and improve awareness.  Instructor also leads participants through a meditation exercise.    Stretching for Flexibility and Mobility:  -Group instruction provided by verbal instruction, patient participation, and written materials to support subject.  Instructors lead participants through series of stretches that are designed to increase flexibility thus improving mobility.  These stretches are additional exercise for major muscle groups that are typically performed  during regular warm up and cool down.   Hands Only CPR:  -Group verbal, video, and participation provides a basic overview of AHA  guidelines for community CPR. Role-play of emergencies allow participants the opportunity to practice calling for help and chest compression technique with discussion of AED use.   Hypertension: -Group verbal and written instruction that provides a basic overview of hypertension including the most recent diagnostic guidelines, risk factor reduction with self-care instructions and medication management.    Nutrition I class: Heart Healthy Eating:  -Group instruction provided by PowerPoint slides, verbal discussion, and written materials to support subject matter. The instructor gives an explanation and review of the Therapeutic Lifestyle Changes diet recommendations, which includes a discussion on lipid goals, dietary fat, sodium, fiber, plant stanol/sterol esters, sugar, and the components of a well-balanced, healthy diet.   Nutrition II class: Lifestyle Skills:  -Group instruction provided by PowerPoint slides, verbal discussion, and written materials to support subject matter. The instructor gives an explanation and review of label reading, grocery shopping for heart health, heart healthy recipe modifications, and ways to make healthier choices when eating out.   Diabetes Question & Answer:  -Group instruction provided by PowerPoint slides, verbal discussion, and written materials to support subject matter. The instructor gives an explanation and review of diabetes co-morbidities, pre- and post-prandial blood glucose goals, pre-exercise blood glucose goals, signs, symptoms, and treatment of hypoglycemia and hyperglycemia, and foot care basics.   Diabetes Blitz:  -Group instruction provided by PowerPoint slides, verbal discussion, and written materials to support subject matter. The instructor gives an explanation and review of the physiology behind type 1 and  type 2 diabetes, diabetes medications and rational behind using different medications, pre- and post-prandial blood glucose recommendations and Hemoglobin A1c goals, diabetes diet, and exercise including blood glucose guidelines for exercising safely.    Portion Distortion:  -Group instruction provided by PowerPoint slides, verbal discussion, written materials, and food models to support subject matter. The instructor gives an explanation of serving size versus portion size, changes in portions sizes over the last 20 years, and what consists of a serving from each food group.   Stress Management:  -Group instruction provided by verbal instruction, video, and written materials to support subject matter.  Instructors review role of stress in heart disease and how to cope with stress positively.     Exercising on Your Own:  -Group instruction provided by verbal instruction, power point, and written materials to support subject.  Instructors discuss benefits of exercise, components of exercise, frequency and intensity of exercise, and end points for exercise.  Also discuss use of nitroglycerin and activating EMS.  Review options of places to exercise outside of rehab.  Review guidelines for sex with heart disease.   Cardiac Drugs I:  -Group instruction provided by verbal instruction and written materials to support subject.  Instructor reviews cardiac drug classes: antiplatelets, anticoagulants, beta blockers, and statins.  Instructor discusses reasons, side effects, and lifestyle considerations for each drug class.   Cardiac Drugs II:  -Group instruction provided by verbal instruction and written materials to support subject.  Instructor reviews cardiac drug classes: angiotensin converting enzyme inhibitors (ACE-I), angiotensin II receptor blockers (ARBs), nitrates, and calcium channel blockers.  Instructor discusses reasons, side effects, and lifestyle considerations for each drug  class.   Anatomy and Physiology of the Circulatory System:  Group verbal and written instruction and models provide basic cardiac anatomy and physiology, with the coronary electrical and arterial systems. Review of: AMI, Angina, Valve disease, Heart Failure, Peripheral Artery Disease, Cardiac Arrhythmia, Pacemakers, and the ICD.   Other Education:  -Group or individual verbal, written, or  video instructions that support the educational goals of the cardiac rehab program.   Holiday Eating Survival Tips:  -Group instruction provided by PowerPoint slides, verbal discussion, and written materials to support subject matter. The instructor gives patients tips, tricks, and techniques to help them not only survive but enjoy the holidays despite the onslaught of food that accompanies the holidays.   Knowledge Questionnaire Score: Knowledge Questionnaire Score - 11/09/17 1140    Knowledge Questionnaire Score          Pre Score  22/28           Core Components/Risk Factors/Patient Goals at Admission: Personal Goals and Risk Factors at Admission - 11/09/17 1224    Core Components/Risk Factors/Patient Goals on Admission           Weight Management  Yes;Weight Maintenance;Weight Loss    Intervention  Weight Management: Develop a combined nutrition and exercise program designed to reach desired caloric intake, while maintaining appropriate intake of nutrient and fiber, sodium and fats, and appropriate energy expenditure required for the weight goal.;Weight Management: Provide education and appropriate resources to help participant work on and attain dietary goals.;Weight Management/Obesity: Establish reasonable short term and long term weight goals.    Admit Weight  142 lb 13.7 oz (64.8 kg)    Goal Weight: Short Term  140 lb (63.5 kg)    Goal Weight: Long Term  135 lb (61.2 kg)    Expected Outcomes  Short Term: Continue to assess and modify interventions until short term weight is achieved;Long  Term: Adherence to nutrition and physical activity/exercise program aimed toward attainment of established weight goal;Weight Loss: Understanding of general recommendations for a balanced deficit meal plan, which promotes 1-2 lb weight loss per week and includes a negative energy balance of [phone] kcal/d;Weight Maintenance: Understanding of the daily nutrition guidelines, which includes 25-35% calories from fat, 7% or less cal from saturated fats, less than 200mg  cholesterol, less than 1.5gm of sodium, & 5 or more servings of fruits and vegetables daily;Understanding recommendations for meals to include 15-35% energy as protein, 25-35% energy from fat, 35-60% energy from carbohydrates, less than 200mg  of dietary cholesterol, 20-35 gm of total fiber daily;Understanding of distribution of calorie intake throughout the day with the consumption of 4-5 meals/snacks    Diabetes  Yes    Intervention  Provide education about signs/symptoms and action to take for hypo/hyperglycemia.;Provide education about proper nutrition, including hydration, and aerobic/resistive exercise prescription along with prescribed medications to achieve blood glucose in normal ranges: Fasting glucose 65-99 mg/dL    Expected Outcomes  Short Term: Participant verbalizes understanding of the signs/symptoms and immediate care of hyper/hypoglycemia, proper foot care and importance of medication, aerobic/resistive exercise and nutrition plan for blood glucose control.;Long Term: Attainment of HbA1C < 7%.    Hypertension  Yes    Intervention  Monitor prescription use compliance.;Provide education on lifestyle modifcations including regular physical activity/exercise, weight management, moderate sodium restriction and increased consumption of fresh fruit, vegetables, and low fat dairy, alcohol moderation, and smoking cessation.    Expected Outcomes  Short Term: Continued assessment and intervention until BP is < 140/46mm HG in hypertensive  participants. < 130/57mm HG in hypertensive participants with diabetes, heart failure or chronic kidney disease.;Long Term: Maintenance of blood pressure at goal levels.    Lipids  Yes    Intervention  Provide education and support for participant on nutrition & aerobic/resistive exercise along with prescribed medications to achieve LDL 70mg , HDL >40mg .    Expected  Outcomes  Short Term: Participant states understanding of desired cholesterol values and is compliant with medications prescribed. Participant is following exercise prescription and nutrition guidelines.;Long Term: Cholesterol controlled with medications as prescribed, with individualized exercise RX and with personalized nutrition plan. Value goals: LDL < 70mg , HDL > 40 mg.           Core Components/Risk Factors/Patient Goals Review:    Core Components/Risk Factors/Patient Goals at Discharge (Final Review):    ITP Comments: ITP Comments    Row Name 11/09/17 0859   ITP Comments  Dr. Armanda Magic, Medical Director      Comments: Patient attended orientation from [phone] to 1056am  to review rules and guidelines for program. Completed 6 minute walk test, Intitial ITP, and exercise prescription.  VSS. Telemetry-sinus rhythm,   Asymptomatic. Deveron Furlong, RN, BSN Cardiac Pulmonary Rehab 11/09/17 12:43 PM

## 2017-11-15 ENCOUNTER — Encounter (HOSPITAL_COMMUNITY): Payer: Self-pay

## 2017-11-15 ENCOUNTER — Encounter (HOSPITAL_COMMUNITY): Payer: Medicaid Other

## 2017-11-17 ENCOUNTER — Encounter (HOSPITAL_COMMUNITY): Payer: Medicaid Other

## 2017-11-19 ENCOUNTER — Encounter (HOSPITAL_COMMUNITY): Payer: Medicaid Other

## 2017-11-22 ENCOUNTER — Ambulatory Visit: Payer: Medicaid Other

## 2017-11-22 ENCOUNTER — Encounter (HOSPITAL_COMMUNITY): Payer: Medicaid Other

## 2017-11-22 ENCOUNTER — Encounter (HOSPITAL_COMMUNITY): Payer: Self-pay

## 2017-11-22 ENCOUNTER — Encounter (HOSPITAL_COMMUNITY)
Admission: RE | Admit: 2017-11-22 | Discharge: 2017-11-22 | Disposition: A | Discharge status: Home / Self Care | Payer: Medicaid Other | Source: Ambulatory Visit | Attending: Cardiovascular Disease | Admitting: Cardiovascular Disease

## 2017-11-22 DIAGNOSIS — Z955 Presence of coronary angioplasty implant and graft: Secondary | ICD-10-CM

## 2017-11-22 DIAGNOSIS — Z48812 Encounter for surgical aftercare following surgery on the circulatory system: Secondary | ICD-10-CM | POA: Diagnosis not present

## 2017-11-22 LAB — GLUCOSE, CAPILLARY
GLUCOSE-CAPILLARY: 116 mg/dL — AB (ref 65–99)
Glucose-Capillary: 149 mg/dL — ABNORMAL HIGH (ref 65–99)

## 2017-11-22 NOTE — Progress Notes (Signed)
Daily Session Note  Patient Details  Name: Livier Hendel MRN: [id number] Date of Birth: [birth year] Referring Provider:     CARDIAC REHAB PHASE II ORIENTATION from 11/09/2017 in [place]  Referring Provider  Quay Burow, MD      Encounter Date: 11/22/2017  Check In: Session Check In - 11/22/17 1140      Check-In   Location  [facility]    Staff Present  Jiles Garter, RN BSN;Tyara Carol Ada, MS,ACSM CEP, Exercise Physiologist;Joann Rion, RN, Marga Melnick, RN, BSN    Supervising physician immediately available to respond to emergencies  [facility] immediately available    Physician(s)  Dr. Maylene Roes     Medication changes reported      No    Fall or balance concerns reported     No    Tobacco Cessation  No Change    Warm-up and Cool-down  Performed as group-led instruction    Resistance Training Performed  Yes    VAD Patient?  No      Pain Assessment   Currently in Pain?  No/denies       Capillary Blood Glucose: Results for orders placed or performed during the hospital encounter of 11/22/17 (from the past 24 hour(s))  Glucose, capillary     Status: Abnormal   Collection Time: 11/22/17 11:23 AM  Result Value Ref Range   Glucose-Capillary 149 (H) 65 - 99 mg/dL  Glucose, capillary     Status: Abnormal   Collection Time: 11/22/17 12:12 PM  Result Value Ref Range   Glucose-Capillary 116 (H) 65 - 99 mg/dL      Social History   Tobacco Use  Smoking Status Current Every Day Smoker  . Packs/day: 0.50  . Types: Cigarettes  . Last attempt to quit: 04/05/2017  . Years since quitting: 0.6  Smokeless Tobacco Never Used    Goals Met:  Exercise tolerated well  Goals Unmet:  Not Applicable  Comments: Pt started cardiac rehab today.  Pt tolerated light exercise without difficulty. VSS, telemetry-SR, asymptomatic.  Medication list reconciled. Pt denies barriers to medicaiton compliance.  Smoking cessation counseling  provided. Pt instructed to call [id number].  Pt seems somewhat reluctant to quitting.  Pt has previously quit cold [place] for a short amount of time but stated she started gaining weight when she quit.  Pt offered emotional support and reassurance.        PSYCHOSOCIAL ASSESSMENT:  PHQ-0. Pt exhibits positive coping skills, hopeful outlook with supportive family. No psychosocial needs identified at this time, no psychosocial interventions necessary.  Pt oriented to exercise equipment and routine.  Understanding verbalized.   Dr. Fransico Him is Medical Director for Cardiac Rehab at [facility].

## 2017-11-24 ENCOUNTER — Encounter (HOSPITAL_COMMUNITY): Payer: Medicaid Other

## 2017-11-24 ENCOUNTER — Encounter (HOSPITAL_COMMUNITY)
Admission: RE | Admit: 2017-11-24 | Discharge: 2017-11-24 | Disposition: A | Discharge status: Home / Self Care | Payer: Medicaid Other | Source: Ambulatory Visit | Attending: Cardiovascular Disease | Admitting: Cardiovascular Disease

## 2017-11-24 DIAGNOSIS — Z48812 Encounter for surgical aftercare following surgery on the circulatory system: Secondary | ICD-10-CM | POA: Diagnosis not present

## 2017-11-24 DIAGNOSIS — Z955 Presence of coronary angioplasty implant and graft: Secondary | ICD-10-CM

## 2017-11-24 LAB — GLUCOSE, CAPILLARY
GLUCOSE-CAPILLARY: 115 mg/dL — AB (ref 65–99)
Glucose-Capillary: 132 mg/dL — ABNORMAL HIGH (ref 65–99)

## 2017-11-26 ENCOUNTER — Encounter (HOSPITAL_COMMUNITY)
Admission: RE | Admit: 2017-11-26 | Discharge: 2017-11-26 | Disposition: A | Discharge status: Home / Self Care | Payer: Medicaid Other | Source: Ambulatory Visit | Attending: Cardiovascular Disease | Admitting: Cardiovascular Disease

## 2017-11-26 ENCOUNTER — Encounter (HOSPITAL_COMMUNITY): Payer: Medicaid Other

## 2017-11-26 DIAGNOSIS — Z48812 Encounter for surgical aftercare following surgery on the circulatory system: Secondary | ICD-10-CM | POA: Diagnosis not present

## 2017-11-26 DIAGNOSIS — Z955 Presence of coronary angioplasty implant and graft: Secondary | ICD-10-CM

## 2017-11-26 LAB — GLUCOSE, CAPILLARY: Glucose-Capillary: 124 mg/dL — ABNORMAL HIGH (ref 65–99)

## 2017-11-29 ENCOUNTER — Encounter (HOSPITAL_COMMUNITY): Payer: Medicaid Other

## 2017-11-29 ENCOUNTER — Telehealth (HOSPITAL_COMMUNITY): Payer: Self-pay

## 2017-12-01 ENCOUNTER — Encounter (HOSPITAL_COMMUNITY): Payer: Medicaid Other

## 2017-12-03 ENCOUNTER — Telehealth (HOSPITAL_COMMUNITY): Payer: Self-pay | Admitting: *Deleted

## 2017-12-03 ENCOUNTER — Encounter (HOSPITAL_COMMUNITY): Payer: Medicaid Other

## 2017-12-06 ENCOUNTER — Encounter (HOSPITAL_COMMUNITY): Payer: Medicaid Other

## 2017-12-06 ENCOUNTER — Telehealth (HOSPITAL_COMMUNITY): Payer: Self-pay

## 2017-12-08 ENCOUNTER — Encounter (HOSPITAL_COMMUNITY): Payer: Medicaid Other

## 2017-12-08 ENCOUNTER — Telehealth (HOSPITAL_COMMUNITY): Payer: Self-pay

## 2017-12-10 ENCOUNTER — Encounter (HOSPITAL_COMMUNITY): Payer: Medicaid Other

## 2017-12-13 ENCOUNTER — Encounter (HOSPITAL_COMMUNITY)
Admission: RE | Admit: 2017-12-13 | Discharge: 2017-12-13 | Disposition: A | Discharge status: Home / Self Care | Payer: Medicaid Other | Source: Ambulatory Visit | Attending: Cardiovascular Disease | Admitting: Cardiovascular Disease

## 2017-12-13 ENCOUNTER — Encounter (HOSPITAL_COMMUNITY): Payer: Medicaid Other

## 2017-12-13 DIAGNOSIS — Z48812 Encounter for surgical aftercare following surgery on the circulatory system: Secondary | ICD-10-CM | POA: Diagnosis not present

## 2017-12-13 DIAGNOSIS — Z7984 Long term (current) use of oral hypoglycemic drugs: Secondary | ICD-10-CM | POA: Diagnosis not present

## 2017-12-13 DIAGNOSIS — Z955 Presence of coronary angioplasty implant and graft: Secondary | ICD-10-CM | POA: Insufficient documentation

## 2017-12-13 DIAGNOSIS — Z79899 Other long term (current) drug therapy: Secondary | ICD-10-CM | POA: Insufficient documentation

## 2017-12-13 DIAGNOSIS — F17210 Nicotine dependence, cigarettes, uncomplicated: Secondary | ICD-10-CM | POA: Diagnosis not present

## 2017-12-13 DIAGNOSIS — Z79891 Long term (current) use of opiate analgesic: Secondary | ICD-10-CM | POA: Insufficient documentation

## 2017-12-13 LAB — GLUCOSE, CAPILLARY: Glucose-Capillary: 169 mg/dL — ABNORMAL HIGH (ref 65–99)

## 2017-12-13 NOTE — Progress Notes (Signed)
Patient returned to exercise today. Reports feeling better. Vital signs stable.Will continue to monitor the patient throughout  the program.Maria Harlon Flor, RN,BSN 12/13/2017 1:49 PM

## 2017-12-14 ENCOUNTER — Ambulatory Visit: Payer: Medicaid Other

## 2017-12-15 ENCOUNTER — Encounter (HOSPITAL_COMMUNITY): Payer: Medicaid Other

## 2017-12-15 ENCOUNTER — Encounter (HOSPITAL_COMMUNITY)
Admission: RE | Admit: 2017-12-15 | Discharge: 2017-12-15 | Disposition: A | Discharge status: Home / Self Care | Payer: Medicaid Other | Source: Ambulatory Visit | Attending: Cardiovascular Disease | Admitting: Cardiovascular Disease

## 2017-12-15 DIAGNOSIS — Z955 Presence of coronary angioplasty implant and graft: Secondary | ICD-10-CM

## 2017-12-15 DIAGNOSIS — Z48812 Encounter for surgical aftercare following surgery on the circulatory system: Secondary | ICD-10-CM | POA: Diagnosis not present

## 2017-12-16 ENCOUNTER — Encounter (HOSPITAL_COMMUNITY): Payer: Self-pay

## 2017-12-16 NOTE — Progress Notes (Signed)
I have reviewed a Home Exercise Prescription with [name] . [name] is currently exercising at home. Pt walks up and down her home steps for 10 minutes and occasionally goes for walks. The patient was advised to walk 2-3 days a week for 30-45 minutes.  [name] and I discussed how to progress their exercise prescription.  Smoking cessation was also discussed with pt. The patient stated that they were still smoking 1/2 pack of cigarettes a day. Pt did not express any desire to quit smoking. The patient stated that they understand the exercise prescription.  We reviewed exercise guidelines, target heart rate during exercise, NTG use, weather, RPE Scale, endpoints for exercise, warmup and cool down. Patient is encouraged to come to me with any questions. I will continue to follow up with the patient to assist them with progression and safety.    Debra Cerise MS, ACSM CEP 7:41 AM 12/16/2017

## 2017-12-16 NOTE — Progress Notes (Signed)
Cardiac Individual Treatment Plan  Patient Details  Name: [name] MRN: [id number] Date of Birth: 03-08-1961 Referring Provider:     CARDIAC REHAB PHASE II ORIENTATION from 11/09/2017 in [facility] CARDIAC REHAB  Referring Provider  Nanetta Batty, MD      Initial Encounter Date:    CARDIAC REHAB PHASE II ORIENTATION from 11/09/2017 in [facility]ARDIAC REH[facility]  Date  11/09/17  Referring Provider  Nanetta Batty, MD      Visit Diagnosis: Stented coronary artery  Patient's Home Medications on Admission:  Current Outpatient Medications:  .  albuterol (PROAIR HFA) 108 (90 Base) MCG/ACT inhaler, Inhale 2 puffs into the lungs every 6 (six) hours as needed for wheezing or shortness of breath., Disp: , Rfl:  .  amLODipine (NORVASC) 5 MG tablet, Take 5 mg by mouth daily., Disp: , Rfl: 2 .  aspirin EC 81 MG tablet, Take 81 mg by mouth daily., Disp: , Rfl:  .  atorvastatin (LIPITOR) 20 MG tablet, Take 20 mg by mouth daily., Disp: , Rfl: 2 .  baclofen (LIORESAL) 20 MG tablet, Take 20 mg by mouth 3 (three) times daily., Disp: , Rfl: 2 .  carvedilol (COREG) 6.25 MG tablet, Take 6.25 mg by mouth 2 (two) times daily., Disp: , Rfl: 0 .  clopidogrel (PLAVIX) 75 MG tablet, Take 1 tablet (75 mg total) by mouth daily., Disp: 30 tablet, Rfl: 11 .  DULoxetine (CYMBALTA) 30 MG capsule, Take 1 capsule (30 mg total) by mouth 2 (two) times daily., Disp: 60 capsule, Rfl: 3 .  HYDROcodone-acetaminophen (NORCO) 7.5-325 MG tablet, Take 1 tablet by mouth 4 (four) times daily as needed for moderate pain. , Disp: , Rfl: 0 .  levothyroxine (SYNTHROID, LEVOTHROID) 100 MCG tablet, Take 100 mcg by mouth daily., Disp: , Rfl:  .  lisinopril (PRINIVIL,ZESTRIL) 20 MG tablet, Take 40 mg by mouth daily. , Disp: , Rfl:  .  metFORMIN (GLUCOPHAGE) 1000 MG tablet, Take 1,000 mg by mouth 2 (two) times daily., Disp: , Rfl: 3 .  metoCLOPramide (REGLAN) 10 MG tablet, Take 10 mg by mouth 2 (two)  times daily. , Disp: , Rfl:  .  Multiple Vitamin (MULTIVITAMIN WITH MINERALS) TABS tablet, Take 1 tablet by mouth daily., Disp: , Rfl:  .  nitroGLYCERIN (NITROSTAT) 0.4 MG SL tablet, Place 1 tablet (0.4 mg total) under the tongue every 5 (five) minutes as needed for chest pain., Disp: 25 tablet, Rfl: 12 .  pantoprazole (PROTONIX) 40 MG tablet, Take 1 tablet (40 mg total) by mouth daily., Disp: 30 tablet, Rfl: 11 .  TRESIBA FLEXTOUCH 200 UNIT/ML SOPN, Inject 25 Units as directed daily., Disp: , Rfl: 2  Past Medical History: Past Medical History:  Diagnosis Date  . Abdominal pain, right lower quadrant   . Abdominal wall pain   . Arterial bruit   . Arthritis   . Arthropathy   . Barrett's esophagus   . Callus   . Carotid stenosis, bilateral   . Chest pain in adult   . Chronic back pain   . Colon polyp   . Coronary artery disease, occlusive   . Eosinophilic esophagitis   . Eosinophilic gastritis   . Fatigue   . Gastritis   . Gastroesophagitis   . H. pylori infection   . H/O: hysterectomy   . Hypersomnia   . IBS (irritable bowel syndrome)   . Inflammatory bowel disease    colitis  . Inlet patch of esophagus   .  Precordial pain   . PVD (peripheral vascular disease) (HCC)   . Restless leg syndrome   . Right foot pain   . Shortness of breath on exertion   . Shoulder pain, right   . Ulcer of esophagus without bleeding     Tobacco Use: Social History   Tobacco Use  Smoking Status Current Every Day Smoker  . Packs/day: 0.50  . Types: Cigarettes  . Last attempt to quit: 04/05/2017  . Years since quitting: 0.6  Smokeless Tobacco Never Used    Labs: Recent Review Flowsheet Data    There is no flowsheet data to display.      Capillary Blood Glucose: Lab Results  Component Value Date   GLUCAP 169 (H) 12/13/2017   GLUCAP 124 (H) 11/26/2017   GLUCAP 115 (H) 11/24/2017   GLUCAP 132 (H) 11/24/2017   GLUCAP 116 (H) 11/22/2017     Exercise Target Goals:    Exercise  Program Goal: Individual exercise prescription set using results from initial 6 min walk test and THRR while considering  patient's activity barriers and safety.   Exercise Prescription Goal: Initial exercise prescription builds to 30-45 minutes a day of aerobic activity, 2-3 days per week.  Home exercise guidelines will be given to patient during program as part of exercise prescription that the participant will acknowledge.  Activity Barriers & Risk Stratification: Activity Barriers & Cardiac Risk Stratification - 11/09/17 0924      Activity Barriers & Cardiac Risk Stratification   Activity Barriers  Back Problems;Joint Problems;Deconditioning;Muscular Weakness;Other (comment);Assistive Device;History of Falls;Balance Concerns    Comments  R sided shoulder and lower neck region pain/discomfort; B knee pain    Cardiac Risk Stratification  High       6 Minute Walk: 6 Minute Walk    Row Name 11/09/17 1214         6 Minute Walk   Phase  Initial     Distance  1109 feet     Walk Time  6 minutes     # of Rest Breaks  0     MPH  2.1     METS  3.2     RPE  12     Symptoms  Yes (comment)     Comments  L leg fatigue     Resting HR  70 bpm     Resting BP  128/66     Resting Oxygen Saturation   96 %     Exercise Oxygen Saturation  during 6 min walk  95 %     Max Ex. HR  88 bpm     Max Ex. BP  138/72     2 Minute Post BP  118/60        Oxygen Initial Assessment:   Oxygen Re-Evaluation:   Oxygen Discharge (Final Oxygen Re-Evaluation):   Initial Exercise Prescription: Initial Exercise Prescription - 11/09/17 1200      Date of Initial Exercise RX and Referring Provider   Date  11/09/17    Referring Provider  Nanetta Batty, MD      Recumbant Bike   Level  2    Minutes  10    METs  1.2      NuStep   Level  2    SPM  70    Minutes  10    METs  2      Track   Laps  8    Minutes  10    METs  2.34  Prescription Details   Frequency (times per week)  3     Duration  Progress to 30 minutes of continuous aerobic without signs/symptoms of physical distress      Intensity   THRR 40-80% of Max Heartrate  65-130    Ratings of Perceived Exertion  11-13    Perceived Dyspnea  0-4      Progression   Progression  Continue to progress workloads to maintain intensity without signs/symptoms of physical distress.      Resistance Training   Training Prescription  Yes    Weight  2lbs    Reps  10-15       Perform Capillary Blood Glucose checks as needed.  Exercise Prescription Changes: Exercise Prescription Changes    Row Name 11/22/17 1354 11/26/17 0715 12/13/17 0730 12/15/17 0753       Response to Exercise   Blood Pressure (Admit)  102/68  106/64  100/60  112/60    Blood Pressure (Exercise)  118/70  124/70  132/60  120/78    Blood Pressure (Exit)  120/60  108/60  102/62  110/72    Heart Rate (Admit)  65 bpm  60 bpm  75 bpm  70 bpm    Heart Rate (Exercise)  84 bpm  96 bpm  104 bpm  94 bpm    Heart Rate (Exit)  56 bpm  57 bpm  62 bpm  66 bpm    Rating of Perceived Exertion (Exercise)  Perceived Dyspnea (Exercise)  0  0  0  0    Symptoms  None  None  None  None    Comments  Pt oriented to exercise equipment  -  -  -    Duration  Progress to 30 minutes of  aerobic without signs/symptoms of physical distress  Continue with 30 min of aerobic exercise without signs/symptoms of physical distress.  Continue with 30 min of aerobic exercise without signs/symptoms of physical distress.  Continue with 30 min of aerobic exercise without signs/symptoms of physical distress.    Intensity  THRR New  THRR unchanged  THRR unchanged  THRR unchanged      Progression   Progression  Continue to progress workloads to maintain intensity without signs/symptoms of physical distress.  Continue to progress workloads to maintain intensity without signs/symptoms of physical distress.  Continue to progress workloads to maintain intensity without  signs/symptoms of physical distress.  Continue to progress workloads to maintain intensity without signs/symptoms of physical distress.    Average METs  2.09  2.66  2.62  2.91      Resistance Training   Training Prescription  Yes  Yes  Yes  No    Weight  2lbs  2lbs  2lbs   -    Reps  10-15  10-15  10-15  -    Time  10 Minutes  10 Minutes  10 Minutes  -      Interval Training   Interval Training  No  No  No  No      Recumbant Bike   Level  Minutes  METs  2  2.97  2.97  2.73      NuStep   Level  -    SPM  75  85  85  -  Minutes  10  10  10   -    METs  1.5  1.7  1.8  -      Track   Laps  10  13  13  13     Minutes  10  10  10  15     METs  2.76  3.3  3.3  3.09      Home Exercise Plan   Plans to continue exercise at  -  -  Home (comment) Walking  Home (comment) Walking    Frequency  -  -  Add 2 additional days to program exercise sessions.  Add 2 additional days to program exercise sessions.    Initial Home Exercises Provided  -  -  12/15/17  12/15/17       Exercise Comments: Exercise Comments    Row Name 11/22/17 1351 12/16/17 0734         Exercise Comments  Pt is off to a great start with exercise. Pt was oriented to exercise equipment. Will continue to monitor and progress pt as tolerated.   Reviewed HEP and goals with pt. Pt has been out sick and has returned this week for exercise. Will continue to work to progress pt as tolerated.          Exercise Goals and Review: Exercise Goals    Row Name 11/09/17 0925             Exercise Goals   Increase Physical Activity  Yes       Intervention  Provide advice, education, support and counseling about physical activity/exercise needs.;Develop an individualized exercise prescription for aerobic and resistive training based on initial evaluation findings, risk stratification, comorbidities and participant's personal goals.       Expected Outcomes  Short Term: Attend rehab on a  regular basis to increase amount of physical activity.;Long Term: Add in home exercise to make exercise part of routine and to increase amount of physical activity.;Long Term: Exercising regularly at least 3-5 days a week.       Increase Strength and Stamina  Yes       Intervention  Provide advice, education, support and counseling about physical activity/exercise needs.;Develop an individualized exercise prescription for aerobic and resistive training based on initial evaluation findings, risk stratification, comorbidities and participant's personal goals.       Expected Outcomes  Short Term: Increase workloads from initial exercise prescription for resistance, speed, and METs.;Short Term: Perform resistance training exercises routinely during rehab and add in resistance training at home;Long Term: Improve cardiorespiratory fitness, muscular endurance and strength as measured by increased METs and functional capacity ( )       Able to understand and use rate of perceived exertion (RPE) scale  Yes       Intervention  Provide education and explanation on how to use RPE scale       Expected Outcomes  Short Term: Able to use RPE daily in rehab to express subjective intensity level;Long Term:  Able to use RPE to guide intensity level when exercising independently       Knowledge and understanding of Target Heart Rate Range (THRR)  Yes       Intervention  Provide education and explanation of THRR including how the numbers were predicted and where they are located for reference       Expected Outcomes  Short Term: Able to state/look up THRR;Long Term: Able to use THRR to govern intensity when exercising independently;Short Term: Able to use daily  as guideline for intensity in rehab       Able to check pulse independently  Yes       Intervention  Provide education and demonstration on how to check pulse in carotid and radial arteries.;Review the importance of being able to check your own pulse for safety  during independent exercise       Expected Outcomes  Short Term: Able to explain why pulse checking is important during independent exercise;Long Term: Able to check pulse independently and accurately       Understanding of Exercise Prescription  Yes       Intervention  Provide education, explanation, and written materials on patient's individual exercise prescription       Expected Outcomes  Short Term: Able to explain program exercise prescription;Long Term: Able to explain home exercise prescription to exercise independently          Exercise Goals Re-Evaluation : Exercise Goals Re-Evaluation    Row Name 12/16/17 0743             Exercise Goal Re-Evaluation   Exercise Goals Review  Increase Physical Activity;Increase Strength and Stamina;Able to understand and use rate of perceived exertion (RPE) scale;Knowledge and understanding of Target Heart Rate Range (THRR);Able to check pulse independently;Understanding of Exercise Prescription       Comments  Pt is slowly progressing. Pt wants to increase stamina and strength. Will work with pt to increase average METs on Nustep. Reviewed HEP with pt. Also reviewed THRR, RPE Scale, weather conditions, endpoints of exercise, NTG use, and warmup and cool down.        Expected Outcomes  Pt will begin to walk 2-3 days at home for 30-45 minutes. Pt will continue to increase cardiorespiratory endurance. Will continue to monitor and progress pt.            Discharge Exercise Prescription (Final Exercise Prescription Changes): Exercise Prescription Changes - 12/15/17 0753      Response to Exercise   Blood Pressure (Admit)  112/60    Blood Pressure (Exercise)  120/78    Blood Pressure (Exit)  110/72    Heart Rate (Admit)  70 bpm    Heart Rate (Exercise)  94 bpm    Heart Rate (Exit)  66 bpm    Rating of Perceived Exertion (Exercise)  10    Perceived Dyspnea (Exercise)  0    Symptoms  None    Duration  Continue with 30 min of aerobic exercise  without signs/symptoms of physical distress.    Intensity  THRR unchanged      Progression   Progression  Continue to progress workloads to maintain intensity without signs/symptoms of physical distress.    Average METs  2.91      Resistance Training   Training Prescription  No      Interval Training   Interval Training  No      Recumbant Bike   Level  2    Minutes  15    METs  2.73      Track   Laps  13    Minutes  15    METs  3.09      Home Exercise Plan   Plans to continue exercise at  Home (comment) Walking    Frequency  Add 2 additional days to program exercise sessions.    Initial Home Exercises Provided  12/15/17       Nutrition:  Target Goals: Understanding of nutrition guidelines, daily intake of sodium 1500mg , cholesterol <  , calories 30% from fat and 7% or less from saturated fats, daily to have 5 or more servings of fruits and vegetables.  Biometrics: Pre Biometrics - 11/09/17 1223      Pre Biometrics   Height  5' 2.75" (1.594 m)    Weight  142 lb 12 oz (64.8 kg)    Waist Circumference  37 inches    Hip Circumference  40 inches    Waist to Hip Ratio  0.92 %    BMI (Calculated)  25.48    Triceps Skinfold  19 mm    % Body Fat  26.5 %    Grip Strength  29 kg    Flexibility  9 in    Single Leg Stand  2 seconds        Nutrition Therapy Plan and Nutrition Goals: Nutrition Therapy & Goals - 11/09/17 1110      Nutrition Therapy   Diet  Carb Modified, Heart Healthy      Personal Nutrition Goals   Nutrition Goal  Improve glycemic control as evidenced by pt's A1c trending from 7.5 to less than 7.0      Intervention Plan   Intervention  Prescribe, educate and counsel regarding individualized specific dietary modifications aiming towards targeted core components such as weight, hypertension, lipid management, diabetes, heart failure and other comorbidities.    Expected Outcomes  Short Term Goal: Understand basic principles of dietary content, such as  calories, fat, sodium, cholesterol and nutrients.;Long Term Goal: Adherence to prescribed nutrition plan.       Nutrition Assessments: Nutrition Assessments - 11/09/17 1111      MEDFICTS Scores   Pre Score  53       Nutrition Goals Re-Evaluation:   Nutrition Goals Re-Evaluation:   Nutrition Goals Discharge (Final Nutrition Goals Re-Evaluation):   Psychosocial: Target Goals: Acknowledge presence or absence of significant depression and/or stress, maximize coping skills, provide positive support system. Participant is able to verbalize types and ability to use techniques and skills needed for reducing stress and depression.  Initial Review & Psychosocial Screening: Initial Psych Review & Screening - 11/09/17 1141      Initial Review   Current issues with  None Identified      Family Dynamics   Good Support System?  Yes daughter       Barriers   Psychosocial barriers to participate in program  There are no identifiable barriers or psychosocial needs.      Screening Interventions   Interventions  Encouraged to exercise       Quality of Life Scores: Quality of Life - 11/09/17 1141      Quality of Life Scores   Health/Function Pre  21.9 %    Socioeconomic Pre  25.38 %    Psych/Spiritual Pre  30 %    Family Pre  26.4 %    GLOBAL Pre  24.97 %      Scores of 19 and below usually indicate a poorer quality of life in these areas.  A difference of  2-3 points is a clinically meaningful difference.  A difference of 2-3 points in the total score of the Quality of Life Index has been associated with significant improvement in overall quality of life, self-image, physical symptoms, and general health in studies assessing change in quality of life.  PHQ-9: Recent Review Flowsheet Data    Depression screen [facility] 2/9 11/22/2017   Decreased Interest 0   Down, Depressed, Hopeless 0   PHQ - 2 Score  0     Interpretation of Total Score  Total Score Depression Severity:  1-4 =  Minimal depression, 5-9 = Mild depression, 10-14 = Moderate depression, 15-19 = Moderately severe depression, 20-27 = Severe depression   Psychosocial Evaluation and Intervention: Psychosocial Evaluation - 11/22/17 1610      Psychosocial Evaluation & Interventions   Interventions  Encouraged to exercise with the program and follow exercise prescription;Relaxation education    Comments  No psychosocial needs identified; no interventions necessary.    Expected Outcomes  Pt will exhibit a positive outlook with good coping skills.    Continue Psychosocial Services   No Follow up required       Psychosocial Re-Evaluation: Psychosocial Re-Evaluation    Row Name 12/16/17 1223             Psychosocial Re-Evaluation   Current issues with  None Identified       Comments  No psychosocial needs identified. No intervention necessary.        Expected Outcomes  Pt will exhibit positive outlook with good coping skills.       Interventions  Encouraged to attend Cardiac Rehabilitation for the exercise       Continue Psychosocial Services   No Follow up required          Psychosocial Discharge (Final Psychosocial Re-Evaluation): Psychosocial Re-Evaluation - 12/16/17 1223      Psychosocial Re-Evaluation   Current issues with  None Identified    Comments  No psychosocial needs identified. No intervention necessary.     Expected Outcomes  Pt will exhibit positive outlook with good coping skills.    Interventions  Encouraged to attend Cardiac Rehabilitation for the exercise    Continue Psychosocial Services   No Follow up required       Vocational Rehabilitation: Provide vocational rehab assistance to qualifying candidates.   Vocational Rehab Evaluation & Intervention: Vocational Rehab - 11/09/17 1141      Initial Vocational Rehab Evaluation & Intervention   Assessment shows need for Vocational Rehabilitation  No       Education: Education Goals: Education classes will be provided on a  weekly basis, covering required topics. Participant will state understanding/return demonstration of topics presented.  Learning Barriers/Preferences: Learning Barriers/Preferences - 11/09/17 1610      Learning Barriers/Preferences   Learning Barriers  Sight    Learning Preferences  Written Material       Education Topics: Count Your Pulse:  -Group instruction provided by verbal instruction, demonstration, patient participation and written materials to support subject.  Instructors address importance of being able to find your pulse and how to count your pulse when at home without a heart monitor.  Patients get hands on experience counting their pulse with staff help and individually.   Heart Attack, Angina, and Risk Factor Modification:  -Group instruction provided by verbal instruction, video, and written materials to support subject.  Instructors address signs and symptoms of angina and heart attacks.    Also discuss risk factors for heart disease and how to make changes to improve heart health risk factors.   CARDIAC REHAB PHASE II EXERCISE from 11/26/2017 in [facility] CARDIAC REHAB  Date  11/24/17  Instruction Review Code  2- Demonstrated Understanding      Functional Fitness:  -Group instruction provided by verbal instruction, demonstration, patient participation, and written materials to support subject.  Instructors address safety measures for doing things around the house.  Discuss how to get up and down  off the floor, how to pick things up properly, how to safely get out of a chair without assistance, and balance training.   CARDIAC REHAB PHASE II EXERCISE from 11/26/2017 in [facility] CARDIAC REHAB  Date  11/26/17  Educator  EP  Instruction Review Code  2- Demonstrated Understanding      Meditation and Mindfulness:  -Group instruction provided by verbal instruction, patient participation, and written materials to support subject.   Instructor addresses importance of mindfulness and meditation practice to help reduce stress and improve awareness.  Instructor also leads participants through a meditation exercise.    Stretching for Flexibility and Mobility:  -Group instruction provided by verbal instruction, patient participation, and written materials to support subject.  Instructors lead participants through series of stretches that are designed to increase flexibility thus improving mobility.  These stretches are additional exercise for major muscle groups that are typically performed during regular warm up and cool down.   Hands Only CPR:  -Group verbal, video, and participation provides a basic overview of AHA guidelines for community CPR. Role-play of emergencies allow participants the opportunity to practice calling for help and chest compression technique with discussion of AED use.   Hypertension: -Group verbal and written instruction that provides a basic overview of hypertension including the most recent diagnostic guidelines, risk factor reduction with self-care instructions and medication management.    Nutrition I class: Heart Healthy Eating:  -Group instruction provided by PowerPoint slides, verbal discussion, and written materials to support subject matter. The instructor gives an explanation and review of the Therapeutic Lifestyle Changes diet recommendations, which includes a discussion on lipid goals, dietary fat, sodium, fiber, plant stanol/sterol esters, sugar, and the components of a well-balanced, healthy diet.   Nutrition II class: Lifestyle Skills:  -Group instruction provided by PowerPoint slides, verbal discussion, and written materials to support subject matter. The instructor gives an explanation and review of label reading, grocery shopping for heart health, heart healthy recipe modifications, and ways to make healthier choices when eating out.   Diabetes Question & Answer:  -Group  instruction provided by PowerPoint slides, verbal discussion, and written materials to support subject matter. The instructor gives an explanation and review of diabetes co-morbidities, pre- and post-prandial blood glucose goals, pre-exercise blood glucose goals, signs, symptoms, and treatment of hypoglycemia and hyperglycemia, and foot care basics.   Diabetes Blitz:  -Group instruction provided by PowerPoint slides, verbal discussion, and written materials to support subject matter. The instructor gives an explanation and review of the physiology behind type 1 and type 2 diabetes, diabetes medications and rational behind using different medications, pre- and post-prandial blood glucose recommendations and Hemoglobin A1c goals, diabetes diet, and exercise including blood glucose guidelines for exercising safely.    Portion Distortion:  -Group instruction provided by PowerPoint slides, verbal discussion, written materials, and food models to support subject matter. The instructor gives an explanation of serving size versus portion size, changes in portions sizes over the last 20 years, and what consists of a serving from each food group.   Stress Management:  -Group instruction provided by verbal instruction, video, and written materials to support subject matter.  Instructors review role of stress in heart disease and how to cope with stress positively.     Exercising on Your Own:  -Group instruction provided by verbal instruction, power point, and written materials to support subject.  Instructors discuss benefits of exercise, components of exercise, frequency and intensity of exercise, and end points for exercise.  Also discuss use of nitroglycerin and activating EMS.  Review options of places to exercise outside of rehab.  Review guidelines for sex with heart disease.   Cardiac Drugs I:  -Group instruction provided by verbal instruction and written materials to support subject.  Instructor  reviews cardiac drug classes: antiplatelets, anticoagulants, beta blockers, and statins.  Instructor discusses reasons, side effects, and lifestyle considerations for each drug class.   Cardiac Drugs II:  -Group instruction provided by verbal instruction and written materials to support subject.  Instructor reviews cardiac drug classes: angiotensin converting enzyme inhibitors (ACE-I), angiotensin II receptor blockers (ARBs), nitrates, and calcium channel blockers.  Instructor discusses reasons, side effects, and lifestyle considerations for each drug class.   Anatomy and Physiology of the Circulatory System:  Group verbal and written instruction and models provide basic cardiac anatomy and physiology, with the coronary electrical and arterial systems. Review of: AMI, Angina, Valve disease, Heart Failure, Peripheral Artery Disease, Cardiac Arrhythmia, Pacemakers, and the ICD.   Other Education:  -Group or individual verbal, written, or video instructions that support the educational goals of the cardiac rehab program.   Holiday Eating Survival Tips:  -Group instruction provided by PowerPoint slides, verbal discussion, and written materials to support subject matter. The instructor gives patients tips, tricks, and techniques to help them not only survive but enjoy the holidays despite the onslaught of food that accompanies the holidays.   Knowledge Questionnaire Score: Knowledge Questionnaire Score - 11/09/17 1140      Knowledge Questionnaire Score   Pre Score  22/28       Core Components/Risk Factors/Patient Goals at Admission: Personal Goals and Risk Factors at Admission - 11/09/17 1224      Core Components/Risk Factors/Patient Goals on Admission    Weight Management  Yes;Weight Maintenance;Weight Loss    Intervention  Weight Management: Develop a combined nutrition and exercise program designed to reach desired caloric intake, while maintaining appropriate intake of nutrient and  fiber, sodium and fats, and appropriate energy expenditure required for the weight goal.;Weight Management: Provide education and appropriate resources to help participant work on and attain dietary goals.;Weight Management/Obesity: Establish reasonable short term and long term weight goals.    Admit Weight  142 lb 13.7 oz (64.8 kg)    Goal Weight: Short Term  140 lb (63.5 kg)    Goal Weight: Long Term  135 lb (61.2 kg)    Expected Outcomes  Short Term: Continue to assess and modify interventions until short term weight is achieved;Long Term: Adherence to nutrition and physical activity/exercise program aimed toward attainment of established weight goal;Weight Loss: Understanding of general recommendations for a balanced deficit meal plan, which promotes 1-2 lb weight loss per week and includes a negative energy balance of [phone] kcal/d;Weight Maintenance: Understanding of the daily nutrition guidelines, which includes 25-35% calories from fat, 7% or less cal from saturated fats, less than  cholesterol, less than 1.5gm of sodium, & 5 or more servings of fruits and vegetables daily;Understanding recommendations for meals to include 15-35% energy as protein, 25-35% energy from fat, 35-60% energy from carbohydrates, less than  of dietary cholesterol, 20-35 gm of total fiber daily;Understanding of distribution of calorie intake throughout the day with the consumption of 4-5 meals/snacks    Diabetes  Yes    Intervention  Provide education about signs/symptoms and action to take for hypo/hyperglycemia.;Provide education about proper nutrition, including hydration, and aerobic/resistive exercise prescription along with prescribed medications to achieve blood glucose in normal ranges: Fasting glucose 65-99  mg/dL    Expected Outcomes  Short Term: Participant verbalizes understanding of the signs/symptoms and immediate care of hyper/hypoglycemia, proper foot care and importance of medication,  aerobic/resistive exercise and nutrition plan for blood glucose control.;Long Term: Attainment of HbA1C < 7%.    Hypertension  Yes    Intervention  Monitor prescription use compliance.;Provide education on lifestyle modifcations including regular physical activity/exercise, weight management, moderate sodium restriction and increased consumption of fresh fruit, vegetables, and low fat dairy, alcohol moderation, and smoking cessation.    Expected Outcomes  Short Term: Continued assessment and intervention until BP is < 140/30mm HG in hypertensive participants. < 130/63mm HG in hypertensive participants with diabetes, heart failure or chronic kidney disease.;Long Term: Maintenance of blood pressure at goal levels.    Lipids  Yes    Intervention  Provide education and support for participant on nutrition & aerobic/resistive exercise along with prescribed medications to achieve LDL 70mg , HDL >40mg .    Expected Outcomes  Short Term: Participant states understanding of desired cholesterol values and is compliant with medications prescribed. Participant is following exercise prescription and nutrition guidelines.;Long Term: Cholesterol controlled with medications as prescribed, with individualized exercise RX and with personalized nutrition plan. Value goals: LDL < 70mg , HDL > 40 mg.       Core Components/Risk Factors/Patient Goals Review:  Goals and Risk Factor Review    Row Name 11/22/17 1608 12/16/17 1222           Core Components/Risk Factors/Patient Goals Review   Personal Goals Review  Weight Management/Obesity;Tobacco Cessation;Diabetes;Hypertension;Lipids  Weight Management/Obesity;Tobacco Cessation;Diabetes;Hypertension;Lipids      Review  Pt with multiple CAD RF will participate in CR exercise and lifestyle RF modification to decrease overall RF.  Pt has been out recently due to sickness. She is eager to get back to exercise.  Pt continues to have no desire to quit smoking.  Counseled patient.       Expected Outcomes  Pt will continue to participate in CR exercise, nutrition, and lifestyle opportunities.   Pt will continue to participate in CR exercise, nutrition, and lifestyle opportunities.          Core Components/Risk Factors/Patient Goals at Discharge (Final Review):  Goals and Risk Factor Review - 12/16/17 1222      Core Components/Risk Factors/Patient Goals Review   Personal Goals Review  Weight Management/Obesity;Tobacco Cessation;Diabetes;Hypertension;Lipids    Review  Pt has been out recently due to sickness. She is eager to get back to exercise.  Pt continues to have no desire to quit smoking.  Counseled patient.    Expected Outcomes  Pt will continue to participate in CR exercise, nutrition, and lifestyle opportunities.        ITP Comments: ITP Comments    Row Name 11/09/17 0859 12/16/17 1221         ITP Comments  Dr. Armanda Magic, Medical Director  30 Day ITP Review. Layni has been out recently due to a cold but was eager to start back to exercise this week.  She was thankful for guidance provided by CR Program while she was out.          Comments: See ITP Comments.

## 2017-12-17 ENCOUNTER — Encounter (HOSPITAL_COMMUNITY)
Admission: RE | Admit: 2017-12-17 | Discharge: 2017-12-17 | Disposition: A | Discharge status: Home / Self Care | Payer: Medicaid Other | Source: Ambulatory Visit | Attending: Cardiovascular Disease | Admitting: Cardiovascular Disease

## 2017-12-17 ENCOUNTER — Encounter (HOSPITAL_COMMUNITY): Payer: Medicaid Other

## 2017-12-17 DIAGNOSIS — Z48812 Encounter for surgical aftercare following surgery on the circulatory system: Secondary | ICD-10-CM | POA: Diagnosis not present

## 2017-12-17 DIAGNOSIS — Z955 Presence of coronary angioplasty implant and graft: Secondary | ICD-10-CM

## 2017-12-17 LAB — GLUCOSE, CAPILLARY
GLUCOSE-CAPILLARY: 96 mg/dL (ref 65–99)
Glucose-Capillary: 81 mg/dL (ref 65–99)

## 2017-12-17 NOTE — Progress Notes (Signed)
Incomplete Session Note  Patient Details  Name: [name] MRN: [id number] Date of Birth: 08-Jul-1961 Referring Provider:     CARDIAC REHAB PHASE II ORIENTATION from 11/09/2017 in [facility] CARDIAC [facility]  Referring Provider  Nanetta Batty, MD      [name] did not complete her rehab session.  CBG 81.  Snack given and CBG increased to 96. Pt unable to check her CBG at home d/t not having test strips.  Pt has been without strips for over 2 weeks.  [name] states that she has called both her PCP and per [name] they have faxed an authorization form to her pharmacy.  [name] states that she has reached out to her pharmacy ([facility] on [address]) and per pt they have not received this completed form from her PCP.  [name] states that she recently had a refill of her medication, [name].  A paper for both the medicine and strips were sent to the pharmacy but the strips were not able to be filled.  Per the pt, the pharmacy stated that did not receive the form for the strips.  Pt understands the need to be able to check her CBG and would like to be able to do so.  She expressed frustration with this current situation.  Emotional support given to patient.   PCP's office called.  Spoke with RN at office.  Today's CBGs communicated as well as current medications listed in Epic.  It was noted that the pt does not currently have an order for test strips in Epic.  These were the same orders present at PCP Office.  Request made for Dr. Wynelle Link or an APP to order strips and send to pharmacy to allow patient to check her CBG at home.  Office RN to leave note for Dr. Wynelle Link or APP requesting strips.

## 2017-12-17 NOTE — Progress Notes (Addendum)
[name] 57 y.o. female DOB: [birth year] MRN: [id number]      Nutrition Note  Dx: DES OMB  Meds reviewed. Metformin, Tresiba, Reglan noted  Nutrition Note Spoke with pt. Nutrition plan and survey reviewed with pt. Pt is following Step 1 of the Therapeutic Lifestyle Changes diet. Pt is diabetic. No recent A1c noted. Pt typically checks CBG's 2 times a day. Pt states she is out of test strips again. Spoke with Lynnda Child, RN re: test strips. Pt called pt's MD re: test strips. Pt has been out of test strips for several weeks. Pt expressed understanding of the information reviewed. Pt aware of nutrition education classes offered.  Nutrition Diagnosis ? Food-and nutrition-related knowledge deficit related to lack of exposure to information as related to diagnosis of: ? CVD ? DM   Nutrition Intervention ? Pt's individual nutrition plan reviewed with pt. ? Benefits of adopting Heart Healthy diet discussed when Medficts reviewed.  Nutrition Goal(s):  ? Improved blood glucose control as evidenced by pt's A1c trending from 7.5 toward less than 7.0.  Plan:  Pt to attend nutrition classes ? Nutrition I ? Nutrition II ? Portion Distortion ? Diabetes Blitz ? Diabetes Q & A Will provide client-centered nutrition education as part of interdisciplinary care.   Monitor and evaluate progress toward nutrition goal with team.  Mickle Plumb, M.Ed, RD, LDN, CDE 12/17/2017 11:44 AM

## 2017-12-20 ENCOUNTER — Encounter (HOSPITAL_COMMUNITY): Payer: Medicaid Other

## 2017-12-21 ENCOUNTER — Ambulatory Visit: Payer: Medicaid Other | Admitting: Neurology

## 2017-12-21 ENCOUNTER — Encounter: Payer: Self-pay | Admitting: Neurology

## 2017-12-21 ENCOUNTER — Ambulatory Visit (INDEPENDENT_AMBULATORY_CARE_PROVIDER_SITE_OTHER): Payer: Medicaid Other | Admitting: Neurology

## 2017-12-21 DIAGNOSIS — R202 Paresthesia of skin: Secondary | ICD-10-CM

## 2017-12-21 DIAGNOSIS — M79601 Pain in right arm: Secondary | ICD-10-CM

## 2017-12-21 DIAGNOSIS — M129 Arthropathy, unspecified: Secondary | ICD-10-CM

## 2017-12-21 NOTE — Progress Notes (Addendum)
The patient comes into the office today, EMG and nerve conduction study done sensory abnormalities in the legs that may suggest a mild peripheral neuropathy.  EMG of the left leg and right arm were unremarkable.  No evidence of a radiculopathy was seen.  The patient has ongoing pain that is severe in the right shoulder and down the right arm, with some pain as well down the left leg.  Patient has not had any prior MRI evaluation of the spine.  MRI of the cervical spine will be set up.    MNC    Nerve / Sites Muscle Latency Ref. Amplitude Ref. Rel Amp Segments Distance Velocity Ref. Area    ms ms mV mV %  cm m/s m/s mVms  R Median - APB     Wrist APB 3.8 ?4.4 7.1 ?4.0 100 Wrist - APB 7   29.4     Upper arm APB 7.3  6.9  97.2 Upper arm - Wrist 20 56 ?49 28.7  L Median - APB     Wrist APB 3.6 ?4.4 7.8 ?4.0 100 Wrist - APB 7   30.1     Upper arm APB 7.3  7.4  94.9 Upper arm - Wrist 20 54 ?49 30.5  R Ulnar - ADM     Wrist ADM 2.9 ?3.3 9.3 ?6.0 100 Wrist - ADM 7   29.0     B.Elbow ADM 6.4  8.9  95.4 B.Elbow - Wrist 18 52 ?49 29.4     A.Elbow ADM 8.2  8.6  97.1 A.Elbow - B.Elbow 10 55 ?49 29.0         A.Elbow - Wrist      L Ulnar - ADM     Wrist ADM 2.9 ?3.3 9.1 ?6.0 100 Wrist - ADM 7   30.7     B.Elbow ADM 6.5  8.9  98 B.Elbow - Wrist 18 49 ?49 30.8     A.Elbow ADM 8.5  8.3  93.3 A.Elbow - B.Elbow 10 49 ?49 28.7         A.Elbow - Wrist      R Peroneal - EDB     Ankle EDB 4.8 ?6.5 5.9 ?2.0 100 Ankle - EDB 9   19.2     Fib head EDB 10.6  5.4  91 Fib head - Ankle 27 46 ?44 16.7     Pop fossa EDB 13.0  5.1  93.9 Pop fossa - Fib head 12 50 ?44 14.8         Pop fossa - Ankle      L Peroneal - EDB     Ankle EDB 5.9 ?6.5 2.6 ?2.0 100 Ankle - EDB 9   7.3     Fib head EDB 11.9  2.2  84.5 Fib head - Ankle 27 45 ?44 6.5     Pop fossa EDB 14.4  2.4  106 Pop fossa - Fib head 12 48 ?44 6.0         Pop fossa - Ankle      R Tibial - AH     Ankle AH 3.9 ?5.8 4.8 ?4.0 100 Ankle - AH 9   14.9     Pop  fossa AH 12.8  4.8  99.1 Pop fossa - Ankle 36 41 ?41 15.1  L Tibial - AH     Ankle AH 4.4 ?5.8 5.5 ?4.0 100 Ankle - AH 9   12.0     Pop fossa AH 12.9  2.7  48.9 Pop fossa - Ankle 36 42 ?41 10.7                     SNC    Nerve / Sites Rec. Site Peak Lat Ref.  Amp Ref. Segments Distance    ms ms V V  cm  R Sural - Ankle (Calf)     Calf Ankle 4.2 ?4.4 12 ?6 Calf - Ankle 14  L Sural - Ankle (Calf)     Calf Ankle 4.3 ?4.4 16 ?6 Calf - Ankle 14  R Superficial peroneal - Ankle     Lat leg Ankle 4.8 ?4.4 4 ?6 Lat leg - Ankle 14  L Superficial peroneal - Ankle     Lat leg Ankle 4.5 ?4.4 5 ?6 Lat leg - Ankle 14  R Median - Orthodromic (Dig II, Mid palm)     Dig II Wrist 3.4 ?3.4 12 ?10 Dig II - Wrist 13  L Median - Orthodromic (Dig II, Mid palm)     Dig II Wrist 3.3 ?3.4 23 ?10 Dig II - Wrist 13  R Ulnar - Orthodromic, (Dig V, Mid palm)     Dig V Wrist 3.1 ?3.1 11 ?5 Dig V - Wrist 11  L Ulnar - Orthodromic, (Dig V, Mid palm)     Dig V Wrist 2.9 ?3.1 18 ?5 Dig V - Wrist 97                     F  Wave    Nerve F Lat Ref.   ms ms  R Tibial - AH 52.3 ?56.0  L Tibial - AH 51.6 ?56.0  R Ulnar - ADM 27.0 ?32.0  L Ulnar - ADM 26.1 ?32.0

## 2017-12-21 NOTE — Procedures (Signed)
HISTORY:  [name] is a 57 year old patient with a history of generalized discomfort, but she particularly has pain in the right shoulder and right arm, she indicates that turning her head will induce pain down the arm.  She also has significant left leg and back pain.  She is being evaluated for this issue.  NERVE CONDUCTION STUDIES:  Nerve conduction studies were performed on both upper extremities. The distal motor latencies and motor amplitudes for the median and ulnar nerves were within normal limits. The nerve conduction velocities for these nerves were also normal. The sensory latencies for the median and ulnar nerves were normal.  The F-wave latencies for the ulnar nerves were normal bilaterally.  Nerve conduction studies were performed on both lower extremities.  The distal motor latencies and motor amplitudes for the peroneal and posterior tibial nerves were normal bilaterally with normal nerve conduction velocity seen for these nerves.  The sensory latencies for the peroneal nerves were prolonged bilaterally, and normal for the sural nerves bilaterally.  The F-wave latencies for the posterior tibial nerves were normal bilaterally.  EMG STUDIES:  EMG study was performed on the right upper extremity:  The first dorsal interosseous muscle reveals 2 to 4 K units with full recruitment. No fibrillations or positive waves were noted. The abductor pollicis brevis muscle reveals 2 to 4 K units with full recruitment. No fibrillations or positive waves were noted. The extensor indicis proprius muscle reveals 1 to 3 K units with full recruitment. No fibrillations or positive waves were noted. The pronator teres muscle reveals 2 to 3 K units with full recruitment. No fibrillations or positive waves were noted. The biceps muscle reveals 1 to 2 K units with full recruitment. No fibrillations or positive waves were noted. The triceps muscle reveals 2 to 4 K units with full recruitment. No  fibrillations or positive waves were noted. The anterior deltoid muscle reveals 2 to 3 K units with full recruitment. No fibrillations or positive waves were noted. The cervical paraspinal muscles were tested at 2 levels. No abnormalities of insertional activity were seen at either level tested. There was poor relaxation.  EMG study was performed on the left lower extremity:  The tibialis anterior muscle reveals 2 to 4K motor units with full recruitment. No fibrillations or positive waves were seen. The peroneus tertius muscle reveals 2 to 4K motor units with full recruitment. No fibrillations or positive waves were seen. The medial gastrocnemius muscle reveals 1 to 3K motor units with full recruitment. No fibrillations or positive waves were seen. The vastus lateralis muscle reveals 2 to 4K motor units with full recruitment. No fibrillations or positive waves were seen. The iliopsoas muscle reveals 2 to 4K motor units with full recruitment. No fibrillations or positive waves were seen. The biceps femoris muscle (long head) reveals 2 to 4K motor units with full recruitment. No fibrillations or positive waves were seen. The lumbosacral paraspinal muscles were tested at 3 levels, and revealed no abnormalities of insertional activity at all 3 levels tested. There was good relaxation.   IMPRESSION:  Nerve conduction studies done on all 4 extremities shows mild sensory abnormalities in the lower extremities that could be consistent with a mild peripheral neuropathy.  The patient does have a history of diabetes.  EMG evaluation of the left lower extremity and the right upper extremity were unremarkable, no evidence of a radiculopathy was seen.  Marlan Palau MD 12/21/2017 4:43 PM  [facility] [address]  [place], [place] [address]  Phone [phone] Fax [phone]

## 2017-12-21 NOTE — Progress Notes (Signed)
Please refer to EMG and nerve conduction study procedure note. 

## 2017-12-22 ENCOUNTER — Encounter (HOSPITAL_COMMUNITY): Payer: Medicaid Other

## 2017-12-22 ENCOUNTER — Telehealth: Payer: Self-pay | Admitting: Neurology

## 2017-12-22 NOTE — Telephone Encounter (Signed)
Medicaid order sent to [facility]. They obtain the auth and will reach out to the pt to schedule.  °

## 2017-12-24 ENCOUNTER — Encounter (HOSPITAL_COMMUNITY): Payer: Medicaid Other

## 2017-12-29 ENCOUNTER — Encounter (HOSPITAL_COMMUNITY): Payer: Medicaid Other

## 2017-12-30 ENCOUNTER — Ambulatory Visit
Admission: RE | Admit: 2017-12-30 | Discharge: 2017-12-30 | Disposition: A | Discharge status: Home / Self Care | Payer: Medicaid Other | Source: Ambulatory Visit | Attending: Neurology | Admitting: Neurology

## 2017-12-30 DIAGNOSIS — M79601 Pain in right arm: Secondary | ICD-10-CM

## 2017-12-31 ENCOUNTER — Encounter (HOSPITAL_COMMUNITY)
Admission: RE | Admit: 2017-12-31 | Discharge: 2017-12-31 | Disposition: A | Discharge status: Home / Self Care | Payer: Medicaid Other | Source: Ambulatory Visit | Attending: Cardiovascular Disease | Admitting: Cardiovascular Disease

## 2017-12-31 ENCOUNTER — Encounter (HOSPITAL_COMMUNITY): Payer: Medicaid Other

## 2017-12-31 DIAGNOSIS — Z955 Presence of coronary angioplasty implant and graft: Secondary | ICD-10-CM

## 2017-12-31 DIAGNOSIS — Z48812 Encounter for surgical aftercare following surgery on the circulatory system: Secondary | ICD-10-CM | POA: Diagnosis not present

## 2017-12-31 LAB — GLUCOSE, CAPILLARY: Glucose-Capillary: 172 mg/dL — ABNORMAL HIGH (ref 65–99)

## 2018-01-02 ENCOUNTER — Telehealth: Payer: Self-pay | Admitting: Neurology

## 2018-01-02 DIAGNOSIS — G8929 Other chronic pain: Secondary | ICD-10-CM

## 2018-01-02 DIAGNOSIS — M25511 Pain in right shoulder: Principal | ICD-10-CM

## 2018-01-02 NOTE — Telephone Encounter (Signed)
I called the patient.  The MRI of the cervical spine was unremarkable with exception of some degenerative changes this at the C6-7 level with edema at the endplates, this can cause neck pain which should not cause pain always down the right arm.  Patient does report some shoulder pain when elevating the right arm, I will get an x-ray of the right shoulder.  The patient could potentially have intrinsic shoulder disease as a source of her discomfort.  I have been ordered an x-ray of the right shoulder.      MRI cervical 12/30/17:  IMPRESSION: This MRI of the cervical spine without contrast shows the following: 1.   At C3-C4, there are degenerative changes causing mild spinal stenosis and mild left foraminal narrowing but no nerve root compression. 2.   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 3.   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 4.   At C6-C7, there are degenerative changes causing mild spinal stenosis and mild bilateral foraminal narrowing but no nerve root compression.  Some of these changes may be more recent as there is edema within the adjacent endplates. 5.   At C7-T1, there is a very small disc protrusion does not lead to any spinal stenosis or nerve root compression

## 2018-01-03 ENCOUNTER — Encounter (HOSPITAL_COMMUNITY)
Admission: RE | Admit: 2018-01-03 | Discharge: 2018-01-03 | Disposition: A | Discharge status: Home / Self Care | Payer: Medicaid Other | Source: Ambulatory Visit | Attending: Cardiovascular Disease | Admitting: Cardiovascular Disease

## 2018-01-03 ENCOUNTER — Encounter (HOSPITAL_COMMUNITY): Payer: Medicaid Other

## 2018-01-03 DIAGNOSIS — F17210 Nicotine dependence, cigarettes, uncomplicated: Secondary | ICD-10-CM | POA: Diagnosis not present

## 2018-01-03 DIAGNOSIS — Z955 Presence of coronary angioplasty implant and graft: Secondary | ICD-10-CM

## 2018-01-03 DIAGNOSIS — Z7984 Long term (current) use of oral hypoglycemic drugs: Secondary | ICD-10-CM | POA: Diagnosis not present

## 2018-01-03 DIAGNOSIS — Z79891 Long term (current) use of opiate analgesic: Secondary | ICD-10-CM | POA: Insufficient documentation

## 2018-01-03 DIAGNOSIS — Z79899 Other long term (current) drug therapy: Secondary | ICD-10-CM | POA: Diagnosis not present

## 2018-01-03 DIAGNOSIS — Z48812 Encounter for surgical aftercare following surgery on the circulatory system: Secondary | ICD-10-CM | POA: Diagnosis not present

## 2018-01-03 LAB — GLUCOSE, CAPILLARY: Glucose-Capillary: 128 mg/dL — ABNORMAL HIGH (ref 65–99)

## 2018-01-05 ENCOUNTER — Encounter (HOSPITAL_COMMUNITY)
Admission: RE | Admit: 2018-01-05 | Discharge: 2018-01-05 | Disposition: A | Discharge status: Home / Self Care | Payer: Medicaid Other | Source: Ambulatory Visit | Attending: Cardiovascular Disease | Admitting: Cardiovascular Disease

## 2018-01-05 ENCOUNTER — Encounter (HOSPITAL_COMMUNITY): Payer: Medicaid Other

## 2018-01-05 DIAGNOSIS — Z48812 Encounter for surgical aftercare following surgery on the circulatory system: Secondary | ICD-10-CM | POA: Diagnosis not present

## 2018-01-05 DIAGNOSIS — Z955 Presence of coronary angioplasty implant and graft: Secondary | ICD-10-CM

## 2018-01-05 LAB — GLUCOSE, CAPILLARY: GLUCOSE-CAPILLARY: 141 mg/dL — AB (ref 65–99)

## 2018-01-06 ENCOUNTER — Ambulatory Visit: Payer: Medicaid Other

## 2018-01-07 ENCOUNTER — Encounter (HOSPITAL_COMMUNITY): Payer: Medicaid Other

## 2018-01-10 ENCOUNTER — Encounter (HOSPITAL_COMMUNITY): Payer: Medicaid Other

## 2018-01-10 ENCOUNTER — Telehealth (HOSPITAL_COMMUNITY): Payer: Self-pay

## 2018-01-12 ENCOUNTER — Encounter (HOSPITAL_COMMUNITY): Payer: Medicaid Other

## 2018-01-13 ENCOUNTER — Encounter (HOSPITAL_COMMUNITY): Payer: Self-pay

## 2018-01-13 NOTE — Progress Notes (Signed)
Cardiac Individual Treatment Plan  Patient Details  Name: [name] MRN: [id number] Date of Birth: 03-08-1961 Referring Provider:     CARDIAC REHAB PHASE II ORIENTATION from 11/09/2017 in [facility] CARDIAC REHAB  Referring Provider  Nanetta Batty, MD      Initial Encounter Date:    CARDIAC REHAB PHASE II ORIENTATION from 11/09/2017 in [facility] CARDIAC REHAB  Date  11/09/17  Referring Provider  Nanetta Batty, MD      Visit Diagnosis: Stented coronary artery  Patient's Home Medications on Admission:  Current Outpatient Medications:  .  albuterol (PROAIR HFA) 108 (90 Base) MCG/ACT inhaler, Inhale 2 puffs into the lungs every 6 (six) hours as needed for wheezing or shortness of breath., Disp: , Rfl:  .  amLODipine (NORVASC) 5 MG tablet, Take 5 mg by mouth daily., Disp: , Rfl: 2 .  aspirin EC 81 MG tablet, Take 81 mg by mouth daily., Disp: , Rfl:  .  atorvastatin (LIPITOR) 20 MG tablet, Take 20 mg by mouth daily., Disp: , Rfl: 2 .  baclofen (LIORESAL) 20 MG tablet, Take 20 mg by mouth 3 (three) times daily., Disp: , Rfl: 2 .  carvedilol (COREG) 6.25 MG tablet, Take 6.25 mg by mouth 2 (two) times daily., Disp: , Rfl: 0 .  clopidogrel (PLAVIX) 75 MG tablet, Take 1 tablet (75 mg total) by mouth daily., Disp: 30 tablet, Rfl: 11 .  DULoxetine (CYMBALTA) 30 MG capsule, Take 1 capsule (30 mg total) by mouth 2 (two) times daily., Disp: 60 capsule, Rfl: 3 .  HYDROcodone-acetaminophen (NORCO) 7.5-325 MG tablet, Take 1 tablet by mouth 4 (four) times daily as needed for moderate pain. , Disp: , Rfl: 0 .  levothyroxine (SYNTHROID, LEVOTHROID) 100 MCG tablet, Take 100 mcg by mouth daily., Disp: , Rfl:  .  lisinopril (PRINIVIL,ZESTRIL) 20 MG tablet, Take 40 mg by mouth daily. , Disp: , Rfl:  .  metFORMIN (GLUCOPHAGE) 1000 MG tablet, Take 1,000 mg by mouth 2 (two) times daily., Disp: , Rfl: 3 .  metoCLOPramide (REGLAN) 10 MG tablet, Take 10 mg by mouth 2 (two)  times daily. , Disp: , Rfl:  .  Multiple Vitamin (MULTIVITAMIN WITH MINERALS) TABS tablet, Take 1 tablet by mouth daily., Disp: , Rfl:  .  nitroGLYCERIN (NITROSTAT) 0.4 MG SL tablet, Place 1 tablet (0.4 mg total) under the tongue every 5 (five) minutes as needed for chest pain., Disp: 25 tablet, Rfl: 12 .  pantoprazole (PROTONIX) 40 MG tablet, Take 1 tablet (40 mg total) by mouth daily., Disp: 30 tablet, Rfl: 11 .  TRESIBA FLEXTOUCH 200 UNIT/ML SOPN, Inject 25 Units as directed daily., Disp: , Rfl: 2  Past Medical History: Past Medical History:  Diagnosis Date  . Abdominal pain, right lower quadrant   . Abdominal wall pain   . Arterial bruit   . Arthritis   . Arthropathy   . Barrett's esophagus   . Callus   . Carotid stenosis, bilateral   . Chest pain in adult   . Chronic back pain   . Colon polyp   . Coronary artery disease, occlusive   . Eosinophilic esophagitis   . Eosinophilic gastritis   . Fatigue   . Gastritis   . Gastroesophagitis   . H. pylori infection   . H/O: hysterectomy   . Hypersomnia   . IBS (irritable bowel syndrome)   . Inflammatory bowel disease    colitis  . Inlet patch of esophagus   .  Precordial pain   . PVD (peripheral vascular disease) (HCC)   . Restless leg syndrome   . Right foot pain   . Shortness of breath on exertion   . Shoulder pain, right   . Ulcer of esophagus without bleeding     Tobacco Use: Social History   Tobacco Use  Smoking Status Current Every Day Smoker  . Packs/day: 0.50  . Types: Cigarettes  . Last attempt to quit: 04/05/2017  . Years since quitting: 0.7  Smokeless Tobacco Never Used    Labs: Recent Review Flowsheet Data    There is no flowsheet data to display.      Capillary Blood Glucose: Lab Results  Component Value Date   GLUCAP 141 (H) 01/05/2018   GLUCAP 128 (H) 01/03/2018   GLUCAP 172 (H) 12/31/2017   GLUCAP 96 12/17/2017   GLUCAP 81 12/17/2017     Exercise Target Goals:    Exercise Program  Goal: Individual exercise prescription set using results from initial 6 min walk test and THRR while considering  patient's activity barriers and safety.   Exercise Prescription Goal: Initial exercise prescription builds to 30-45 minutes a day of aerobic activity, 2-3 days per week.  Home exercise guidelines will be given to patient during program as part of exercise prescription that the participant will acknowledge.  Activity Barriers & Risk Stratification: Activity Barriers & Cardiac Risk Stratification - 11/09/17 0924      Activity Barriers & Cardiac Risk Stratification   Activity Barriers  Back Problems;Joint Problems;Deconditioning;Muscular Weakness;Other (comment);Assistive Device;History of Falls;Balance Concerns    Comments  R sided shoulder and lower neck region pain/discomfort; B knee pain    Cardiac Risk Stratification  High       6 Minute Walk: 6 Minute Walk    Row Name 11/09/17 1214         6 Minute Walk   Phase  Initial     Distance  1109 feet     Walk Time  6 minutes     # of Rest Breaks  0     MPH  2.1     METS  3.2     RPE  12     Symptoms  Yes (comment)     Comments  L leg fatigue     Resting HR  70 bpm     Resting BP  128/66     Resting Oxygen Saturation   96 %     Exercise Oxygen Saturation  during 6 min walk  95 %     Max Ex. HR  88 bpm     Max Ex. BP  138/72     2 Minute Post BP  118/60        Oxygen Initial Assessment:   Oxygen Re-Evaluation:   Oxygen Discharge (Final Oxygen Re-Evaluation):   Initial Exercise Prescription: Initial Exercise Prescription - 11/09/17 1200      Date of Initial Exercise RX and Referring Provider   Date  11/09/17    Referring Provider  Nanetta Batty, MD      Recumbant Bike   Level  2    Minutes  10    METs  1.2      NuStep   Level  2    SPM  70    Minutes  10    METs  2      Track   Laps  8    Minutes  10    METs  2.34  Prescription Details   Frequency (times per week)  3    Duration   Progress to 30 minutes of continuous aerobic without signs/symptoms of physical distress      Intensity   THRR 40-80% of Max Heartrate  65-130    Ratings of Perceived Exertion  11-13    Perceived Dyspnea  0-4      Progression   Progression  Continue to progress workloads to maintain intensity without signs/symptoms of physical distress.      Resistance Training   Training Prescription  Yes    Weight  2lbs    Reps  10-15       Perform Capillary Blood Glucose checks as needed.  Exercise Prescription Changes: Exercise Prescription Changes    Row Name 11/22/17 1354 11/26/17 0715 12/13/17 0730 12/15/17 0753 01/05/18 1519     Response to Exercise   Blood Pressure (Admit)  102/68  106/64  100/60  112/60  112/60   Blood Pressure (Exercise)  118/70  124/70  132/60  120/78  136/70   Blood Pressure (Exit)  120/60  108/60  102/62  110/72  102/70   Heart Rate (Admit)  65 bpm  60 bpm  75 bpm  70 bpm  65 bpm   Heart Rate (Exercise)  84 bpm  96 bpm  104 bpm  94 bpm  95 bpm   Heart Rate (Exit)  56 bpm  57 bpm  62 bpm  66 bpm  74 bpm   Rating of Perceived Exertion (Exercise)  10  11  11  10  11    Perceived Dyspnea (Exercise)  0  0  0  0  0   Symptoms  None  None  None  None  None   Comments  Pt oriented to exercise equipment  -  -  -  -   Duration  Progress to 30 minutes of  aerobic without signs/symptoms of physical distress  Continue with 30 min of aerobic exercise without signs/symptoms of physical distress.  Continue with 30 min of aerobic exercise without signs/symptoms of physical distress.  Continue with 30 min of aerobic exercise without signs/symptoms of physical distress.  Continue with 30 min of aerobic exercise without signs/symptoms of physical distress.   Intensity  THRR New  THRR unchanged  THRR unchanged  THRR unchanged  THRR New     Progression   Progression  Continue to progress workloads to maintain intensity without signs/symptoms of physical distress.  Continue to progress  workloads to maintain intensity without signs/symptoms of physical distress.  Continue to progress workloads to maintain intensity without signs/symptoms of physical distress.  Continue to progress workloads to maintain intensity without signs/symptoms of physical distress.  Continue to progress workloads to maintain intensity without signs/symptoms of physical distress.   Average METs  2.09  2.66  2.62  2.91  3.61     Resistance Training   Training Prescription  Yes  Yes  Yes  No  No   Weight  2lbs  2lbs  2lbs   -  -   Reps  10-15  10-15  10-15  -  -   Time  10 Minutes  10 Minutes  10 Minutes  -  -     Interval Training   Interval Training  No  No  No  No  No     Recumbant Bike   Level  2  2  2  2  2    Minutes  10  10  10  15  15   METs  2  2.97  2.97  2.73  3.6     NuStep   Level  2  3  3   -  -   SPM  75  85  85  -  -   Minutes  10  10  10   -  -   METs  1.5  1.7  1.8  -  -     Track   Laps  10  13  13  13  15    Minutes  10  10  10  15  15    METs  2.76  3.3  3.3  3.09  3.61     Home Exercise Plan   Plans to continue exercise at  -  -  Home (comment) Walking  Home (comment) Walking  Home (comment)   Frequency  -  -  Add 2 additional days to program exercise sessions.  Add 2 additional days to program exercise sessions.  Add 2 additional days to program exercise sessions.   Initial Home Exercises Provided  -  -  12/15/17  12/15/17  12/15/17      Exercise Comments: Exercise Comments    Row Name 11/22/17 1351 12/16/17 0734 01/10/18 1520       Exercise Comments  Pt is off to a great start with exercise. Pt was oriented to exercise equipment. Will continue to monitor and progress pt as tolerated.   Reviewed HEP and goals with pt. Pt has been out sick and has returned this week for exercise. Will continue to work to progress pt as tolerated.   Reviewed METs and Goals with pt. Pt does not have great attendance in rehab. Pt has been ecouraged to come more regularly since graduation  date is approaching. Will continue to monitor pt.         Exercise Goals and Review: Exercise Goals    Row Name 11/09/17 0925             Exercise Goals   Increase Physical Activity  Yes       Intervention  Provide advice, education, support and counseling about physical activity/exercise needs.;Develop an individualized exercise prescription for aerobic and resistive training based on initial evaluation findings, risk stratification, comorbidities and participant's personal goals.       Expected Outcomes  Short Term: Attend rehab on a regular basis to increase amount of physical activity.;Long Term: Add in home exercise to make exercise part of routine and to increase amount of physical activity.;Long Term: Exercising regularly at least 3-5 days a week.       Increase Strength and Stamina  Yes       Intervention  Provide advice, education, support and counseling about physical activity/exercise needs.;Develop an individualized exercise prescription for aerobic and resistive training based on initial evaluation findings, risk stratification, comorbidities and participant's personal goals.       Expected Outcomes  Short Term: Increase workloads from initial exercise prescription for resistance, speed, and METs.;Short Term: Perform resistance training exercises routinely during rehab and add in resistance training at home;Long Term: Improve cardiorespiratory fitness, muscular endurance and strength as measured by increased METs and functional capacity ( )       Able to understand and use rate of perceived exertion (RPE) scale  Yes       Intervention  Provide education and explanation on how to use RPE scale       Expected Outcomes  Short Term: Able to use  RPE daily in rehab to express subjective intensity level;Long Term:  Able to use RPE to guide intensity level when exercising independently       Knowledge and understanding of Target Heart Rate Range (THRR)  Yes       Intervention  Provide  education and explanation of THRR including how the numbers were predicted and where they are located for reference       Expected Outcomes  Short Term: Able to state/look up THRR;Long Term: Able to use THRR to govern intensity when exercising independently;Short Term: Able to use daily as guideline for intensity in rehab       Able to check pulse independently  Yes       Intervention  Provide education and demonstration on how to check pulse in carotid and radial arteries.;Review the importance of being able to check your own pulse for safety during independent exercise       Expected Outcomes  Short Term: Able to explain why pulse checking is important during independent exercise;Long Term: Able to check pulse independently and accurately       Understanding of Exercise Prescription  Yes       Intervention  Provide education, explanation, and written materials on patient's individual exercise prescription       Expected Outcomes  Short Term: Able to explain program exercise prescription;Long Term: Able to explain home exercise prescription to exercise independently          Exercise Goals Re-Evaluation : Exercise Goals Re-Evaluation    Row Name 12/16/17 0743             Exercise Goal Re-Evaluation   Exercise Goals Review  Increase Physical Activity;Increase Strength and Stamina;Able to understand and use rate of perceived exertion (RPE) scale;Knowledge and understanding of Target Heart Rate Range (THRR);Able to check pulse independently;Understanding of Exercise Prescription       Comments  Pt is slowly progressing. Pt wants to increase stamina and strength. Will work with pt to increase average METs on Nustep. Reviewed HEP with pt. Also reviewed THRR, RPE Scale, weather conditions, endpoints of exercise, NTG use, and warmup and cool down.        Expected Outcomes  Pt will begin to walk 2-3 days at home for 30-45 minutes. Pt will continue to increase cardiorespiratory endurance. Will continue  to monitor and progress pt.            Discharge Exercise Prescription (Final Exercise Prescription Changes): Exercise Prescription Changes - 01/05/18 1519      Response to Exercise   Blood Pressure (Admit)  112/60    Blood Pressure (Exercise)  136/70    Blood Pressure (Exit)  102/70    Heart Rate (Admit)  65 bpm    Heart Rate (Exercise)  95 bpm    Heart Rate (Exit)  74 bpm    Rating of Perceived Exertion (Exercise)  11    Perceived Dyspnea (Exercise)  0    Symptoms  None    Duration  Continue with 30 min of aerobic exercise without signs/symptoms of physical distress.    Intensity  THRR New      Progression   Progression  Continue to progress workloads to maintain intensity without signs/symptoms of physical distress.    Average METs  3.61      Resistance Training   Training Prescription  No      Interval Training   Interval Training  No      Recumbant Bike   Level  2    Minutes  15    METs  3.6      Track   Laps  15    Minutes  15    METs  3.61      Home Exercise Plan   Plans to continue exercise at  Home (comment)    Frequency  Add 2 additional days to program exercise sessions.    Initial Home Exercises Provided  12/15/17       Nutrition:  Target Goals: Understanding of nutrition guidelines, daily intake of sodium 1500mg , cholesterol 200mg , calories 30% from fat and 7% or less from saturated fats, daily to have 5 or more servings of fruits and vegetables.  Biometrics: Pre Biometrics - 11/09/17 1223      Pre Biometrics   Height  5' 2.75" (1.594 m)    Weight  142 lb 12 oz (64.8 kg)    Waist Circumference  37 inches    Hip Circumference  40 inches    Waist to Hip Ratio  0.92 %    BMI (Calculated)  25.48    Triceps Skinfold  19 mm    % Body Fat  26.5 %    Grip Strength  29 kg    Flexibility  9 in    Single Leg Stand  2 seconds        Nutrition Therapy Plan and Nutrition Goals: Nutrition Therapy & Goals - 11/09/17 1110      Nutrition Therapy    Diet  Carb Modified, Heart Healthy      Personal Nutrition Goals   Nutrition Goal  Improve glycemic control as evidenced by pt's A1c trending from 7.5 to less than 7.0      Intervention Plan   Intervention  Prescribe, educate and counsel regarding individualized specific dietary modifications aiming towards targeted core components such as weight, hypertension, lipid management, diabetes, heart failure and other comorbidities.    Expected Outcomes  Short Term Goal: Understand basic principles of dietary content, such as calories, fat, sodium, cholesterol and nutrients.;Long Term Goal: Adherence to prescribed nutrition plan.       Nutrition Assessments: Nutrition Assessments - 11/09/17 1111      MEDFICTS Scores   Pre Score  53       Nutrition Goals Re-Evaluation:   Nutrition Goals Re-Evaluation:   Nutrition Goals Discharge (Final Nutrition Goals Re-Evaluation):   Psychosocial: Target Goals: Acknowledge presence or absence of significant depression and/or stress, maximize coping skills, provide positive support system. Participant is able to verbalize types and ability to use techniques and skills needed for reducing stress and depression.  Initial Review & Psychosocial Screening: Initial Psych Review & Screening - 11/09/17 1141      Initial Review   Current issues with  None Identified      Family Dynamics   Good Support System?  Yes daughter       Barriers   Psychosocial barriers to participate in program  There are no identifiable barriers or psychosocial needs.      Screening Interventions   Interventions  Encouraged to exercise       Quality of Life Scores: Quality of Life - 11/09/17 1141      Quality of Life Scores   Health/Function Pre  21.9 %    Socioeconomic Pre  25.38 %    Psych/Spiritual Pre  30 %    Family Pre  26.4 %    GLOBAL Pre  24.97 %      Scores of 19  and below usually indicate a poorer quality of life in these areas.  A difference of   2-3 points is a clinically meaningful difference.  A difference of 2-3 points in the total score of the Quality of Life Index has been associated with significant improvement in overall quality of life, self-image, physical symptoms, and general health in studies assessing change in quality of life.  PHQ-9: Recent Review Flowsheet Data    Depression screen [facility] 2/9 11/22/2017   Decreased Interest 0   Down, Depressed, Hopeless 0   PHQ - 2 Score 0     Interpretation of Total Score  Total Score Depression Severity:  1-4 = Minimal depression, 5-9 = Mild depression, 10-14 = Moderate depression, 15-19 = Moderately severe depression, 20-27 = Severe depression   Psychosocial Evaluation and Intervention: Psychosocial Evaluation - 11/22/17 1610      Psychosocial Evaluation & Interventions   Interventions  Encouraged to exercise with the program and follow exercise prescription;Relaxation education    Comments  No psychosocial needs identified; no interventions necessary.    Expected Outcomes  Pt will exhibit a positive outlook with good coping skills.    Continue Psychosocial Services   No Follow up required       Psychosocial Re-Evaluation: Psychosocial Re-Evaluation    Row Name 12/16/17 1223 01/13/18 1132           Psychosocial Re-Evaluation   Current issues with  None Identified  None Identified      Comments  No psychosocial needs identified. No intervention necessary.   No psychosocial needs identified. No intervention necessary.       Expected Outcomes  Pt will exhibit positive outlook with good coping skills.  Pt will exhibit positive outlook with good coping skills.      Interventions  Encouraged to attend Cardiac Rehabilitation for the exercise  Encouraged to attend Cardiac Rehabilitation for the exercise      Continue Psychosocial Services   No Follow up required  No Follow up required         Psychosocial Discharge (Final Psychosocial Re-Evaluation): Psychosocial  Re-Evaluation - 01/13/18 1132      Psychosocial Re-Evaluation   Current issues with  None Identified    Comments  No psychosocial needs identified. No intervention necessary.     Expected Outcomes  Pt will exhibit positive outlook with good coping skills.    Interventions  Encouraged to attend Cardiac Rehabilitation for the exercise    Continue Psychosocial Services   No Follow up required       Vocational Rehabilitation: Provide vocational rehab assistance to qualifying candidates.   Vocational Rehab Evaluation & Intervention: Vocational Rehab - 11/09/17 1141      Initial Vocational Rehab Evaluation & Intervention   Assessment shows need for Vocational Rehabilitation  No       Education: Education Goals: Education classes will be provided on a weekly basis, covering required topics. Participant will state understanding/return demonstration of topics presented.  Learning Barriers/Preferences: Learning Barriers/Preferences - 11/09/17 1610      Learning Barriers/Preferences   Learning Barriers  Sight    Learning Preferences  Written Material       Education Topics: Count Your Pulse:  -Group instruction provided by verbal instruction, demonstration, patient participation and written materials to support subject.  Instructors address importance of being able to find your pulse and how to count your pulse when at home without a heart monitor.  Patients get hands on experience counting their pulse with staff help  and individually.   CARDIAC REHAB PHASE II EXERCISE from 01/05/2018 in [facility] CARDIAC REHAB  Date  12/17/17  Instruction Review Code  1- Verbalizes Understanding      Heart Attack, Angina, and Risk Factor Modification:  -Group instruction provided by verbal instruction, video, and written materials to support subject.  Instructors address signs and symptoms of angina and heart attacks.    Also discuss risk factors for heart disease and how to make  changes to improve heart health risk factors.   CARDIAC REHAB PHASE II EXERCISE from 01/05/2018 in [facility] CARDIAC REHAB  Date  11/24/17  Instruction Review Code  2- Demonstrated Understanding      Functional Fitness:  -Group instruction provided by verbal instruction, demonstration, patient participation, and written materials to support subject.  Instructors address safety measures for doing things around the house.  Discuss how to get up and down off the floor, how to pick things up properly, how to safely get out of a chair without assistance, and balance training.   CARDIAC REHAB PHASE II EXERCISE from 01/05/2018 in [facility] CARDIAC REHAB  Date  11/26/17  Educator  EP  Instruction Review Code  2- Demonstrated Understanding      Meditation and Mindfulness:  -Group instruction provided by verbal instruction, patient participation, and written materials to support subject.  Instructor addresses importance of mindfulness and meditation practice to help reduce stress and improve awareness.  Instructor also leads participants through a meditation exercise.    Stretching for Flexibility and Mobility:  -Group instruction provided by verbal instruction, patient participation, and written materials to support subject.  Instructors lead participants through series of stretches that are designed to increase flexibility thus improving mobility.  These stretches are additional exercise for major muscle groups that are typically performed during regular warm up and cool down.   Hands Only CPR:  -Group verbal, video, and participation provides a basic overview of AHA guidelines for community CPR. Role-play of emergencies allow participants the opportunity to practice calling for help and chest compression technique with discussion of AED use.   Hypertension: -Group verbal and written instruction that provides a basic overview of hypertension including the most  recent diagnostic guidelines, risk factor reduction with self-care instructions and medication management.    Nutrition I class: Heart Healthy Eating:  -Group instruction provided by PowerPoint slides, verbal discussion, and written materials to support subject matter. The instructor gives an explanation and review of the Therapeutic Lifestyle Changes diet recommendations, which includes a discussion on lipid goals, dietary fat, sodium, fiber, plant stanol/sterol esters, sugar, and the components of a well-balanced, healthy diet.   Nutrition II class: Lifestyle Skills:  -Group instruction provided by PowerPoint slides, verbal discussion, and written materials to support subject matter. The instructor gives an explanation and review of label reading, grocery shopping for heart health, heart healthy recipe modifications, and ways to make healthier choices when eating out.   Diabetes Question & Answer:  -Group instruction provided by PowerPoint slides, verbal discussion, and written materials to support subject matter. The instructor gives an explanation and review of diabetes co-morbidities, pre- and post-prandial blood glucose goals, pre-exercise blood glucose goals, signs, symptoms, and treatment of hypoglycemia and hyperglycemia, and foot care basics.   Diabetes Blitz:  -Group instruction provided by PowerPoint slides, verbal discussion, and written materials to support subject matter. The instructor gives an explanation and review of the physiology behind type 1 and type 2 diabetes, diabetes  medications and rational behind using different medications, pre- and post-prandial blood glucose recommendations and Hemoglobin A1c goals, diabetes diet, and exercise including blood glucose guidelines for exercising safely.    Portion Distortion:  -Group instruction provided by PowerPoint slides, verbal discussion, written materials, and food models to support subject matter. The instructor gives an  explanation of serving size versus portion size, changes in portions sizes over the last 20 years, and what consists of a serving from each food group.   Stress Management:  -Group instruction provided by verbal instruction, video, and written materials to support subject matter.  Instructors review role of stress in heart disease and how to cope with stress positively.     Exercising on Your Own:  -Group instruction provided by verbal instruction, power point, and written materials to support subject.  Instructors discuss benefits of exercise, components of exercise, frequency and intensity of exercise, and end points for exercise.  Also discuss use of nitroglycerin and activating EMS.  Review options of places to exercise outside of rehab.  Review guidelines for sex with heart disease.   Cardiac Drugs I:  -Group instruction provided by verbal instruction and written materials to support subject.  Instructor reviews cardiac drug classes: antiplatelets, anticoagulants, beta blockers, and statins.  Instructor discusses reasons, side effects, and lifestyle considerations for each drug class.   Cardiac Drugs II:  -Group instruction provided by verbal instruction and written materials to support subject.  Instructor reviews cardiac drug classes: angiotensin converting enzyme inhibitors (ACE-I), angiotensin II receptor blockers (ARBs), nitrates, and calcium channel blockers.  Instructor discusses reasons, side effects, and lifestyle considerations for each drug class.   Anatomy and Physiology of the Circulatory System:  Group verbal and written instruction and models provide basic cardiac anatomy and physiology, with the coronary electrical and arterial systems. Review of: AMI, Angina, Valve disease, Heart Failure, Peripheral Artery Disease, Cardiac Arrhythmia, Pacemakers, and the ICD.   CARDIAC REHAB PHASE II EXERCISE from 01/05/2018 in [facility] CARDIAC REHAB  Date  01/05/18   Instruction Review Code  2- Demonstrated Understanding      Other Education:  -Group or individual verbal, written, or video instructions that support the educational goals of the cardiac rehab program.   Holiday Eating Survival Tips:  -Group instruction provided by PowerPoint slides, verbal discussion, and written materials to support subject matter. The instructor gives patients tips, tricks, and techniques to help them not only survive but enjoy the holidays despite the onslaught of food that accompanies the holidays.   Knowledge Questionnaire Score: Knowledge Questionnaire Score - 11/09/17 1140      Knowledge Questionnaire Score   Pre Score  22/28       Core Components/Risk Factors/Patient Goals at Admission: Personal Goals and Risk Factors at Admission - 11/09/17 1224      Core Components/Risk Factors/Patient Goals on Admission    Weight Management  Yes;Weight Maintenance;Weight Loss    Intervention  Weight Management: Develop a combined nutrition and exercise program designed to reach desired caloric intake, while maintaining appropriate intake of nutrient and fiber, sodium and fats, and appropriate energy expenditure required for the weight goal.;Weight Management: Provide education and appropriate resources to help participant work on and attain dietary goals.;Weight Management/Obesity: Establish reasonable short term and long term weight goals.    Admit Weight  142 lb 13.7 oz (64.8 kg)    Goal Weight: Short Term  140 lb (63.5 kg)    Goal Weight: Long Term  135 lb (61.2 kg)  Expected Outcomes  Short Term: Continue to assess and modify interventions until short term weight is achieved;Long Term: Adherence to nutrition and physical activity/exercise program aimed toward attainment of established weight goal;Weight Loss: Understanding of general recommendations for a balanced deficit meal plan, which promotes 1-2 lb weight loss per week and includes a negative energy balance of  [phone] kcal/d;Weight Maintenance: Understanding of the daily nutrition guidelines, which includes 25-35% calories from fat, 7% or less cal from saturated fats, less than 200mg  cholesterol, less than 1.5gm of sodium, & 5 or more servings of fruits and vegetables daily;Understanding recommendations for meals to include 15-35% energy as protein, 25-35% energy from fat, 35-60% energy from carbohydrates, less than 200mg  of dietary cholesterol, 20-35 gm of total fiber daily;Understanding of distribution of calorie intake throughout the day with the consumption of 4-5 meals/snacks    Diabetes  Yes    Intervention  Provide education about signs/symptoms and action to take for hypo/hyperglycemia.;Provide education about proper nutrition, including hydration, and aerobic/resistive exercise prescription along with prescribed medications to achieve blood glucose in normal ranges: Fasting glucose 65-99 mg/dL    Expected Outcomes  Short Term: Participant verbalizes understanding of the signs/symptoms and immediate care of hyper/hypoglycemia, proper foot care and importance of medication, aerobic/resistive exercise and nutrition plan for blood glucose control.;Long Term: Attainment of HbA1C < 7%.    Hypertension  Yes    Intervention  Monitor prescription use compliance.;Provide education on lifestyle modifcations including regular physical activity/exercise, weight management, moderate sodium restriction and increased consumption of fresh fruit, vegetables, and low fat dairy, alcohol moderation, and smoking cessation.    Expected Outcomes  Short Term: Continued assessment and intervention until BP is < 140/6990mm HG in hypertensive participants. < 130/4680mm HG in hypertensive participants with diabetes, heart failure or chronic kidney disease.;Long Term: Maintenance of blood pressure at goal levels.    Lipids  Yes    Intervention  Provide education and support for participant on nutrition & aerobic/resistive exercise along  with prescribed medications to achieve LDL 70mg , HDL >40mg .    Expected Outcomes  Short Term: Participant states understanding of desired cholesterol values and is compliant with medications prescribed. Participant is following exercise prescription and nutrition guidelines.;Long Term: Cholesterol controlled with medications as prescribed, with individualized exercise RX and with personalized nutrition plan. Value goals: LDL < 70mg , HDL > 40 mg.       Core Components/Risk Factors/Patient Goals Review:  Goals and Risk Factor Review    Row Name 11/22/17 1608 12/16/17 1222 01/13/18 1130         Core Components/Risk Factors/Patient Goals Review   Personal Goals Review  Weight Management/Obesity;Tobacco Cessation;Diabetes;Hypertension;Lipids  Weight Management/Obesity;Tobacco Cessation;Diabetes;Hypertension;Lipids  Weight Management/Obesity;Tobacco Cessation;Diabetes;Hypertension;Lipids     Review  Pt with multiple CAD RF will participate in CR exercise and lifestyle RF modification to decrease overall RF.  Pt has been out recently due to sickness. She is eager to get back to exercise.  Pt continues to have no desire to quit smoking.  Counseled patient.  Pt with multiple CAD RF continues with frequent absences.  Encouraged to attend CR Program as much as possible as her last date is approaching.  Pt remains steadfast to continue smoking.     Expected Outcomes  Pt will continue to participate in CR exercise, nutrition, and lifestyle opportunities.   Pt will continue to participate in CR exercise, nutrition, and lifestyle opportunities.   Pt will continue to participate in CR exercise, nutrition, and lifestyle opportunities.  Core Components/Risk Factors/Patient Goals at Discharge (Final Review):  Goals and Risk Factor Review - 01/13/18 1130      Core Components/Risk Factors/Patient Goals Review   Personal Goals Review  Weight Management/Obesity;Tobacco Cessation;Diabetes;Hypertension;Lipids     Review  Pt with multiple CAD RF continues with frequent absences.  Encouraged to attend CR Program as much as possible as her last date is approaching.  Pt remains steadfast to continue smoking.    Expected Outcomes  Pt will continue to participate in CR exercise, nutrition, and lifestyle opportunities.        ITP Comments: ITP Comments    Row Name 11/09/17 0859 12/16/17 1221 01/13/18 1132       ITP Comments  Dr. Armanda Magic, Medical Director  30 Day ITP Review. Ayme has been out recently due to a cold but was eager to start back to exercise this week.  She was thankful for guidance provided by CR Program while she was out.   30 Day ITP Review.  Pranathi continues to have poor attendance d/t other ailments. Encouraged pt to attend with graduation date approaching.        Comments: See ITP Comments

## 2018-01-14 ENCOUNTER — Encounter (HOSPITAL_COMMUNITY): Payer: Medicaid Other

## 2018-01-14 ENCOUNTER — Telehealth (HOSPITAL_COMMUNITY): Payer: Self-pay

## 2018-01-17 ENCOUNTER — Encounter (HOSPITAL_COMMUNITY): Payer: Medicaid Other

## 2018-01-19 ENCOUNTER — Encounter (HOSPITAL_COMMUNITY): Payer: Medicaid Other

## 2018-01-21 ENCOUNTER — Encounter (HOSPITAL_COMMUNITY)
Admission: RE | Admit: 2018-01-21 | Discharge: 2018-01-21 | Disposition: A | Discharge status: Home / Self Care | Payer: Medicaid Other | Source: Ambulatory Visit | Attending: Cardiovascular Disease | Admitting: Cardiovascular Disease

## 2018-01-21 ENCOUNTER — Encounter (HOSPITAL_COMMUNITY): Payer: Medicaid Other

## 2018-01-21 ENCOUNTER — Encounter (HOSPITAL_COMMUNITY): Payer: Self-pay

## 2018-01-21 VITALS — Ht 62.75 in | Wt 142.2 lb

## 2018-01-21 DIAGNOSIS — Z48812 Encounter for surgical aftercare following surgery on the circulatory system: Secondary | ICD-10-CM | POA: Diagnosis not present

## 2018-01-21 DIAGNOSIS — Z955 Presence of coronary angioplasty implant and graft: Secondary | ICD-10-CM

## 2018-01-21 LAB — GLUCOSE, CAPILLARY: Glucose-Capillary: 208 mg/dL — ABNORMAL HIGH (ref 65–99)

## 2018-01-24 ENCOUNTER — Encounter (HOSPITAL_COMMUNITY): Payer: Medicaid Other

## 2018-01-24 ENCOUNTER — Encounter (HOSPITAL_COMMUNITY)
Admission: RE | Admit: 2018-01-24 | Discharge: 2018-01-24 | Disposition: A | Discharge status: Home / Self Care | Payer: Medicaid Other | Source: Ambulatory Visit | Attending: Cardiovascular Disease | Admitting: Cardiovascular Disease

## 2018-01-26 ENCOUNTER — Encounter (HOSPITAL_COMMUNITY): Payer: Medicaid Other

## 2018-01-28 ENCOUNTER — Encounter (HOSPITAL_COMMUNITY): Payer: Medicaid Other

## 2018-01-31 ENCOUNTER — Encounter (HOSPITAL_COMMUNITY): Payer: Medicaid Other

## 2018-02-02 ENCOUNTER — Encounter (HOSPITAL_COMMUNITY): Payer: Medicaid Other

## 2018-02-04 ENCOUNTER — Encounter (HOSPITAL_COMMUNITY): Payer: Medicaid Other

## 2018-02-04 NOTE — Progress Notes (Signed)
Discharge Progress Report  Patient Details  Name: [name] MRN: [id number] Date of Birth: Apr 08, 1961 Referring Provider:     CARDIAC REHAB PHASE II ORIENTATION from 11/09/2017 in [facility] CARDIAC REHAB  Referring Provider  Nanetta Batty, MD       Number of Visits: 10  Reason for Discharge:  Patient reached a stable level of exercise.  Smoking History:  Social History   Tobacco Use  Smoking Status Current Every Day Smoker  . Packs/day: 0.50  . Types: Cigarettes  . Last attempt to quit: 04/05/2017  . Years since quitting: 0.8  Smokeless Tobacco Never Used    Diagnosis:  Stented coronary artery  ADL UCSD:   Initial Exercise Prescription: Initial Exercise Prescription - 11/09/17 1200      Date of Initial Exercise RX and Referring Provider   Date  11/09/17    Referring Provider  Nanetta Batty, MD      Recumbant Bike   Level  2    Minutes  10    METs  1.2      NuStep   Level  2    SPM  70    Minutes  10    METs  2      Track   Laps  8    Minutes  10    METs  2.34      Prescription Details   Frequency (times per week)  3    Duration  Progress to 30 minutes of continuous aerobic without signs/symptoms of physical distress      Intensity   THRR 40-80% of Max Heartrate  65-130    Ratings of Perceived Exertion  11-13    Perceived Dyspnea  0-4      Progression   Progression  Continue to progress workloads to maintain intensity without signs/symptoms of physical distress.      Resistance Training   Training Prescription  Yes    Weight  2lbs    Reps  10-15       Discharge Exercise Prescription (Final Exercise Prescription Changes): Exercise Prescription Changes - 01/21/18 1031      Response to Exercise   Blood Pressure (Admit)  120/72    Blood Pressure (Exercise)  128/74    Blood Pressure (Exit)  122/60    Heart Rate (Admit)  79 bpm    Heart Rate (Exercise)  102 bpm    Heart Rate (Exit)  62 bpm    Rating of Perceived  Exertion (Exercise)  11    Perceived Dyspnea (Exercise)  0    Symptoms  None     Comments  Pt completed and discharge paperwork     Duration  Continue with 30 min of aerobic exercise without signs/symptoms of physical distress.    Intensity  THRR unchanged      Progression   Progression  Continue to progress workloads to maintain intensity without signs/symptoms of physical distress.    Average METs  3.61      Resistance Training   Training Prescription  No      Interval Training   Interval Training  No      Track   Laps  7    Minutes  10    METs  2.38      Home Exercise Plan   Plans to continue exercise at  Home (comment) Walking    Frequency  Add 2 additional days to program exercise sessions.    Initial Home Exercises Provided  12/15/17       Functional Capacity: 6 Minute Walk    Row Name 11/09/17 1214 02/02/18 1026       6 Minute Walk   Phase  Initial  Discharge    Distance  1109 feet  1475 feet    Distance % Change  -  33 %    Distance Feet Change  -  366 ft    Walk Time  6 minutes  6 minutes    # of Rest Breaks  0  0    MPH  2.1  2.79    METS  3.2  3.88    RPE  12  11    Perceived Dyspnea   -  0    VO2 Peak  -  13.59    Symptoms  Yes (comment)  No    Comments  L leg fatigue  -    Resting HR  70 bpm  79 bpm    Resting BP  128/66  120/72    Resting Oxygen Saturation   96 %  -    Exercise Oxygen Saturation  during 6 min walk  95 %  -    Max Ex. HR  88 bpm  102 bpm    Max Ex. BP  138/72  128/74    2 Minute Post BP  118/60  122/60       Psychological, QOL, Others - Outcomes: PHQ 2/9: Depression screen [facility] 2/9 01/21/2018 11/22/2017  Decreased Interest 0 0  Down, Depressed, Hopeless 0 0  PHQ - 2 Score 0 0    Quality of Life: Quality of Life - 01/21/18 1508      Quality of Life Scores   Health/Function Pre  21.9 %    Health/Function Post  24.63 %    Health/Function % Change  12.47 %    Socioeconomic Pre  25.38 %    Socioeconomic Post  27.75 %     Socioeconomic % Change   9.34 %    Psych/Spiritual Pre  30 %    Psych/Spiritual Post  30 %    Psych/Spiritual % Change  0 %    Family Pre  26.4 %    Family Post  28.3 %    Family % Change  7.2 %    GLOBAL Pre  24.97 %    GLOBAL Post  26.94 %    GLOBAL % Change  7.89 %       Personal Goals: Goals established at orientation with interventions provided to work toward goal. Personal Goals and Risk Factors at Admission - 11/09/17 1224      Core Components/Risk Factors/Patient Goals on Admission    Weight Management  Yes;Weight Maintenance;Weight Loss    Intervention  Weight Management: Develop a combined nutrition and exercise program designed to reach desired caloric intake, while maintaining appropriate intake of nutrient and fiber, sodium and fats, and appropriate energy expenditure required for the weight goal.;Weight Management: Provide education and appropriate resources to help participant work on and attain dietary goals.;Weight Management/Obesity: Establish reasonable short term and long term weight goals.    Admit Weight  142 lb 13.7 oz (64.8 kg)    Goal Weight: Short Term  140 lb (63.5 kg)    Goal Weight: Long Term  135 lb (61.2 kg)    Expected Outcomes  Short Term: Continue to assess and modify interventions until short term weight is achieved;Long Term: Adherence to nutrition and physical activity/exercise program aimed toward attainment of  established weight goal;Weight Loss: Understanding of general recommendations for a balanced deficit meal plan, which promotes 1-2 lb weight loss per week and includes a negative energy balance of [phone] kcal/d;Weight Maintenance: Understanding of the daily nutrition guidelines, which includes 25-35% calories from fat, 7% or less cal from saturated fats, less than 200mg  cholesterol, less than 1.5gm of sodium, & 5 or more servings of fruits and vegetables daily;Understanding recommendations for meals to include 15-35% energy as protein, 25-35%  energy from fat, 35-60% energy from carbohydrates, less than 200mg  of dietary cholesterol, 20-35 gm of total fiber daily;Understanding of distribution of calorie intake throughout the day with the consumption of 4-5 meals/snacks    Diabetes  Yes    Intervention  Provide education about signs/symptoms and action to take for hypo/hyperglycemia.;Provide education about proper nutrition, including hydration, and aerobic/resistive exercise prescription along with prescribed medications to achieve blood glucose in normal ranges: Fasting glucose 65-99 mg/dL    Expected Outcomes  Short Term: Participant verbalizes understanding of the signs/symptoms and immediate care of hyper/hypoglycemia, proper foot care and importance of medication, aerobic/resistive exercise and nutrition plan for blood glucose control.;Long Term: Attainment of HbA1C < 7%.    Hypertension  Yes    Intervention  Monitor prescription use compliance.;Provide education on lifestyle modifcations including regular physical activity/exercise, weight management, moderate sodium restriction and increased consumption of fresh fruit, vegetables, and low fat dairy, alcohol moderation, and smoking cessation.    Expected Outcomes  Short Term: Continued assessment and intervention until BP is < 140/4690mm HG in hypertensive participants. < 130/3180mm HG in hypertensive participants with diabetes, heart failure or chronic kidney disease.;Long Term: Maintenance of blood pressure at goal levels.    Lipids  Yes    Intervention  Provide education and support for participant on nutrition & aerobic/resistive exercise along with prescribed medications to achieve LDL 70mg , HDL >40mg .    Expected Outcomes  Short Term: Participant states understanding of desired cholesterol values and is compliant with medications prescribed. Participant is following exercise prescription and nutrition guidelines.;Long Term: Cholesterol controlled with medications as prescribed, with  individualized exercise RX and with personalized nutrition plan. Value goals: LDL < 70mg , HDL > 40 mg.        Personal Goals Discharge: Goals and Risk Factor Review    Row Name 11/22/17 1608 12/16/17 1222 01/13/18 1130 01/21/18 1621       Core Components/Risk Factors/Patient Goals Review   Personal Goals Review  Weight Management/Obesity;Tobacco Cessation;Diabetes;Hypertension;Lipids  Weight Management/Obesity;Tobacco Cessation;Diabetes;Hypertension;Lipids  Weight Management/Obesity;Tobacco Cessation;Diabetes;Hypertension;Lipids  Weight Management/Obesity;Tobacco Cessation;Diabetes;Hypertension;Lipids    Review  Pt with multiple CAD RF will participate in CR exercise and lifestyle RF modification to decrease overall RF.  Pt has been out recently due to sickness. She is eager to get back to exercise.  Pt continues to have no desire to quit smoking.  Counseled patient.  Pt with multiple CAD RF continues with frequent absences.  Encouraged to attend CR Program as much as possible as her last date is approaching.  Pt remains steadfast to continue smoking.  Pt with multiple CAD RF.  Myrene BuddyYvonne states that she has reduced the amount of smoking to 0.5 ppd.  She was encouraged to continue smoking cessation.    Expected Outcomes  Pt will continue to participate in CR exercise, nutrition, and lifestyle opportunities.   Pt will continue to participate in CR exercise, nutrition, and lifestyle opportunities.   Pt will continue to participate in CR exercise, nutrition, and lifestyle opportunities.   Pt will continue  to participate in exercise, nutrition, and lifestyle opportunities to reduce risk of CV event.        Exercise Goals and Review: Exercise Goals    Row Name 11/09/17 0925             Exercise Goals   Increase Physical Activity  Yes       Intervention  Provide advice, education, support and counseling about physical activity/exercise needs.;Develop an individualized exercise prescription for  aerobic and resistive training based on initial evaluation findings, risk stratification, comorbidities and participant's personal goals.       Expected Outcomes  Short Term: Attend rehab on a regular basis to increase amount of physical activity.;Long Term: Add in home exercise to make exercise part of routine and to increase amount of physical activity.;Long Term: Exercising regularly at least 3-5 days a week.       Increase Strength and Stamina  Yes       Intervention  Provide advice, education, support and counseling about physical activity/exercise needs.;Develop an individualized exercise prescription for aerobic and resistive training based on initial evaluation findings, risk stratification, comorbidities and participant's personal goals.       Expected Outcomes  Short Term: Increase workloads from initial exercise prescription for resistance, speed, and METs.;Short Term: Perform resistance training exercises routinely during rehab and add in resistance training at home;Long Term: Improve cardiorespiratory fitness, muscular endurance and strength as measured by increased METs and functional capacity ( )       Able to understand and use rate of perceived exertion (RPE) scale  Yes       Intervention  Provide education and explanation on how to use RPE scale       Expected Outcomes  Short Term: Able to use RPE daily in rehab to express subjective intensity level;Long Term:  Able to use RPE to guide intensity level when exercising independently       Knowledge and understanding of Target Heart Rate Range (THRR)  Yes       Intervention  Provide education and explanation of THRR including how the numbers were predicted and where they are located for reference       Expected Outcomes  Short Term: Able to state/look up THRR;Long Term: Able to use THRR to govern intensity when exercising independently;Short Term: Able to use daily as guideline for intensity in rehab       Able to check pulse  independently  Yes       Intervention  Provide education and demonstration on how to check pulse in carotid and radial arteries.;Review the importance of being able to check your own pulse for safety during independent exercise       Expected Outcomes  Short Term: Able to explain why pulse checking is important during independent exercise;Long Term: Able to check pulse independently and accurately       Understanding of Exercise Prescription  Yes       Intervention  Provide education, explanation, and written materials on patient's individual exercise prescription       Expected Outcomes  Short Term: Able to explain program exercise prescription;Long Term: Able to explain home exercise prescription to exercise independently          Nutrition & Weight - Outcomes: Pre Biometrics - 11/09/17 1223      Pre Biometrics   Height  5' 2.75" (1.594 m)    Weight  142 lb 12 oz (64.8 kg)    Waist Circumference  37 inches  Hip Circumference  40 inches    Waist to Hip Ratio  0.92 %    BMI (Calculated)  25.48    Triceps Skinfold  19 mm    % Body Fat  26.5 %    Grip Strength  29 kg    Flexibility  9 in    Single Leg Stand  2 seconds      Post Biometrics - 02/02/18 1030       Post  Biometrics   Height  5' 2.75" (1.594 m)    Weight  142 lb 3.2 oz (64.5 kg)    Waist Circumference  38 inches    Hip Circumference  41 inches    Waist to Hip Ratio  0.93 %    BMI (Calculated)  25.39    Triceps Skinfold  20 mm    % Body Fat  36.6 %    Grip Strength  29 kg    Flexibility  12 in    Single Leg Stand  3.3 seconds       Nutrition: Nutrition Therapy & Goals - 11/09/17 1110      Nutrition Therapy   Diet  Carb Modified, Heart Healthy      Personal Nutrition Goals   Nutrition Goal  Improve glycemic control as evidenced by pt's A1c trending from 7.5 to less than 7.0      Intervention Plan   Intervention  Prescribe, educate and counsel regarding individualized specific dietary modifications aiming  towards targeted core components such as weight, hypertension, lipid management, diabetes, heart failure and other comorbidities.    Expected Outcomes  Short Term Goal: Understand basic principles of dietary content, such as calories, fat, sodium, cholesterol and nutrients.;Long Term Goal: Adherence to prescribed nutrition plan.       Nutrition Discharge: Nutrition Assessments - 02/04/18 1031      MEDFICTS Scores   Pre Score  53    Post Score  48    Score Difference  -5       Education Questionnaire Score: Knowledge Questionnaire Score - 02/02/18 1033      Knowledge Questionnaire Score   Pre Score  22/28    Post Score  18/24       Goals reviewed with patient; copy given to patient.

## 2018-02-07 ENCOUNTER — Encounter (HOSPITAL_COMMUNITY): Payer: Medicaid Other

## 2018-02-09 ENCOUNTER — Encounter (HOSPITAL_COMMUNITY): Payer: Medicaid Other

## 2018-02-11 ENCOUNTER — Encounter (HOSPITAL_COMMUNITY): Payer: Medicaid Other

## 2018-02-13 ENCOUNTER — Other Ambulatory Visit: Payer: Self-pay | Admitting: Neurology

## 2018-02-14 ENCOUNTER — Encounter (HOSPITAL_COMMUNITY): Payer: Medicaid Other

## 2018-02-16 ENCOUNTER — Encounter (HOSPITAL_COMMUNITY): Payer: Medicaid Other

## 2018-02-18 ENCOUNTER — Encounter (HOSPITAL_COMMUNITY): Payer: Medicaid Other

## 2018-02-28 ENCOUNTER — Ambulatory Visit: Payer: Medicaid Other | Admitting: Neurology

## 2018-03-15 ENCOUNTER — Ambulatory Visit: Payer: Medicaid Other

## 2018-03-15 ENCOUNTER — Ambulatory Visit
Admission: RE | Admit: 2018-03-15 | Discharge: 2018-03-15 | Disposition: A | Discharge status: Home / Self Care | Payer: Medicaid Other | Source: Ambulatory Visit | Attending: Family Medicine | Admitting: Family Medicine

## 2018-03-15 DIAGNOSIS — Z139 Encounter for screening, unspecified: Secondary | ICD-10-CM

## 2018-04-06 ENCOUNTER — Encounter: Payer: Self-pay | Admitting: General Practice

## 2018-04-27 ENCOUNTER — Ambulatory Visit: Payer: Medicaid Other | Admitting: Obstetrics and Gynecology

## 2018-05-09 ENCOUNTER — Ambulatory Visit: Payer: Medicaid Other | Admitting: Obstetrics and Gynecology

## 2018-05-24 ENCOUNTER — Encounter: Payer: Self-pay | Admitting: Obstetrics and Gynecology

## 2018-05-24 ENCOUNTER — Ambulatory Visit (INDEPENDENT_AMBULATORY_CARE_PROVIDER_SITE_OTHER): Payer: Medicaid Other | Admitting: Obstetrics and Gynecology

## 2018-05-24 ENCOUNTER — Other Ambulatory Visit (HOSPITAL_COMMUNITY)
Admission: RE | Admit: 2018-05-24 | Discharge: 2018-05-24 | Disposition: A | Discharge status: Home / Self Care | Payer: Medicaid Other | Source: Ambulatory Visit | Attending: Obstetrics and Gynecology | Admitting: Obstetrics and Gynecology

## 2018-05-24 VITALS — BP 144/75 | HR 76 | Ht 62.0 in | Wt 147.4 lb

## 2018-05-24 DIAGNOSIS — N950 Postmenopausal bleeding: Secondary | ICD-10-CM | POA: Diagnosis not present

## 2018-05-24 NOTE — Progress Notes (Signed)
Pt presents for annual c/o PMB x 1 month. Pt has partial hyst; has one ovary and one tube per pt.   Normal MGM 03/07/18 Normal Colonoscopy 8 yrs ago - Benign polyps per pt  Normal pap 2016

## 2018-05-24 NOTE — Progress Notes (Signed)
57 yo G3P1 here for the evaluation of postmenopausal vaginal bleeding. Patient noticed vaginal bleeding a month ago. She describes it as the presence of a light tan daily spotting. She denies any pelvic pain. She denies any dysuria. She is not sexually active. She is uncertain if it is coming from her rectum. She denies any pelvic trauma or pruritis. Patient with a history of hysterectomy due to fibroid uterus.  Past Medical History:  Diagnosis Date  . Abdominal pain, right lower quadrant   . Abdominal wall pain   . Arterial bruit   . Arthritis   . Arthropathy   . Barrett's esophagus   . Callus   . Carotid stenosis, bilateral   . Chest pain in adult   . Chronic back pain   . Colon polyp   . Coronary artery disease, occlusive   . Eosinophilic esophagitis   . Eosinophilic gastritis   . Fatigue   . Gastritis   . Gastroesophagitis   . H. pylori infection   . H/O: hysterectomy   . Hypersomnia   . IBS (irritable bowel syndrome)   . Inflammatory bowel disease    colitis  . Inlet patch of esophagus   . Precordial pain   . PVD (peripheral vascular disease) (HCC)   . Restless leg syndrome   . Right foot pain   . Shortness of breath on exertion   . Shoulder pain, right   . Ulcer of esophagus without bleeding    Past Surgical History:  Procedure Laterality Date  . AUGMENTATION MAMMAPLASTY Bilateral    breast lift  . BREAST EXCISIONAL BIOPSY Right   . CORONARY STENT INTERVENTION N/A 05/27/2017   Procedure: CORONARY STENT INTERVENTION;  Surgeon: Runell Gess, MD;  Location: [facility];  Service: Cardiovascular;  Laterality: N/A;  . FOOT SURGERY Left   . INTRAVASCULAR PRESSURE WIRE/FFR STUDY N/A 05/27/2017   Procedure: INTRAVASCULAR PRESSURE WIRE/FFR STUDY;  Surgeon: Runell Gess, MD;  Location: [facility];  Service: Cardiovascular;  Laterality: N/A;  . LEFT HEART CATH AND CORONARY ANGIOGRAPHY N/A 05/27/2017   Procedure: LEFT HEART CATH AND CORONARY  ANGIOGRAPHY;  Surgeon: Runell Gess, MD;  Location: [facility];  Service: Cardiovascular;  Laterality: N/A;  . THYROIDECTOMY     Family History  Problem Relation Age of Onset  . Hyperlipidemia Mother   . Hypertension Mother   . Diabetes Mellitus II Mother   . Cancer Father   . Hypertension Sister   . Hypertension Brother   . Hypertension Brother   . Hypertension Brother   . Hypertension Sister   . Hypertension Sister   . Heart disease Maternal Aunt    Social History   Tobacco Use  . Smoking status: Current Every Day Smoker    Packs/day: 0.50    Types: Cigarettes    Last attempt to quit: 04/05/2017    Years since quitting: 1.1  . Smokeless tobacco: Never Used  Substance Use Topics  . Alcohol use: No    Frequency: Never  . Drug use: No   ROS See pertinent in HPI  Blood pressure (!) 144/75, pulse 76, height 5\' 2"  (1.575 m), weight 147 lb 6.4 oz (66.9 kg). GENERAL: Well-developed, well-nourished female in no acute distress.  ABDOMEN: Soft, nontender, nondistended. No organomegaly. PELVIC: Normal external female genitalia. Vagina is pink and rugated.  Normal discharge. Vaginal vault intact. No cervix or uterus. No adnexal mass or tenderness. Small non bleeding hemorrhoid noted EXTREMITIES: No cyanosis,  clubbing, or edema, 2+ distal pulses.  A/P 57 yo here for the evaluation of vaginal bleeding - Wet prep collected - No source of vaginal bleeding on exam - urine culture ordered - Patient will be contacted with abnormal results - Advised to follow up with PCP for GI referral for possible rectal bleeding

## 2018-05-25 LAB — CERVICOVAGINAL ANCILLARY ONLY
Bacterial vaginitis: NEGATIVE
CANDIDA VAGINITIS: NEGATIVE
CHLAMYDIA, DNA PROBE: NEGATIVE
Neisseria Gonorrhea: NEGATIVE
TRICH (WINDOWPATH): POSITIVE — AB

## 2018-05-26 ENCOUNTER — Telehealth: Payer: Self-pay

## 2018-05-26 LAB — URINE CULTURE

## 2018-05-26 MED ORDER — METRONIDAZOLE 500 MG PO TABS: 500.0000 mg | ORAL_TABLET | Freq: Two times a day (BID) | ORAL | 0 refills | Status: DC

## 2018-05-26 NOTE — Addendum Note (Signed)
Addended by: Catalina Antigua on: 05/26/2018 10:57 AM   Modules accepted: Orders

## 2018-05-26 NOTE — Telephone Encounter (Signed)
Advised of results, treatment plan, and rx sent.

## 2018-06-02 ENCOUNTER — Other Ambulatory Visit: Payer: Self-pay

## 2018-06-02 ENCOUNTER — Encounter: Payer: Self-pay | Admitting: Neurology

## 2018-06-02 ENCOUNTER — Ambulatory Visit: Payer: Medicaid Other | Admitting: Neurology

## 2018-06-02 VITALS — BP 143/86 | HR 76 | Resp 16 | Ht 62.0 in | Wt 146.0 lb

## 2018-06-02 DIAGNOSIS — M542 Cervicalgia: Secondary | ICD-10-CM

## 2018-06-02 DIAGNOSIS — M25561 Pain in right knee: Secondary | ICD-10-CM

## 2018-06-02 DIAGNOSIS — E1142 Type 2 diabetes mellitus with diabetic polyneuropathy: Secondary | ICD-10-CM

## 2018-06-02 DIAGNOSIS — E1140 Type 2 diabetes mellitus with diabetic neuropathy, unspecified: Secondary | ICD-10-CM

## 2018-06-02 DIAGNOSIS — M545 Low back pain, unspecified: Secondary | ICD-10-CM

## 2018-06-02 DIAGNOSIS — M25511 Pain in right shoulder: Secondary | ICD-10-CM

## 2018-06-02 DIAGNOSIS — M25562 Pain in left knee: Secondary | ICD-10-CM

## 2018-06-02 DIAGNOSIS — G8929 Other chronic pain: Secondary | ICD-10-CM

## 2018-06-02 HISTORY — DX: Type 2 diabetes mellitus with diabetic neuropathy, unspecified: E11.40

## 2018-06-02 MED ORDER — DULOXETINE HCL 60 MG PO CPEP: 60.0000 mg | ORAL_CAPSULE | Freq: Two times a day (BID) | ORAL | 5 refills | Status: DC

## 2018-06-02 NOTE — Progress Notes (Signed)
Reason for visit: Neck pain, low back pain, diabetic neuropathy  [name] is an 57 y.o. female  History of present illness:  [name] is a 57 year old right-handed black female with a history of diabetes and a mild diabetic peripheral neuropathy.  The patient mainly is concerned about her chronic neck pain, the patient has significant right shoulder pain as well likely related to intrinsic right shoulder disease.  The patient has significant pain with elevation of the arm, she is not able to get the arm up more than 80 or 90 degrees with abduction.  The patient also reports bilateral knee pain with crepitus in the knees when she tries to walk.  She is currently bothered by significant low back pain without radiation down the legs.  The patient has had MRI of the cervical spine that does not show nerve root compression, EMG of the arm and leg did not show evidence of a radiculopathy.  The patient does have some straightening of the spine of the neck, she likely has spasm.  The patient is on baclofen taking 60 mg total daily.  She is on Cymbalta taking 30 mg twice daily.  She continues to have a lot of neck stiffness and stiffness in the low back.  She tries to walk and stretch some, she has not been through any physical therapy recently.  In the past, gabapentin has not been very effective for her pain.  She returns to this office for an evaluation.  Past Medical History:  Diagnosis Date  . Abdominal pain, right lower quadrant   . Abdominal wall pain   . Arterial bruit   . Arthritis   . Arthropathy   . Barrett's esophagus   . Callus   . Carotid stenosis, bilateral   . Chest pain in adult   . Chronic back pain   . Colon polyp   . Coronary artery disease, occlusive   . Eosinophilic esophagitis   . Eosinophilic gastritis   . Fatigue   . Gastritis   . Gastroesophagitis   . H. pylori infection   . H/O: hysterectomy   . Hypersomnia   . IBS (irritable bowel syndrome)   .  Inflammatory bowel disease    colitis  . Inlet patch of esophagus   . Precordial pain   . PVD (peripheral vascular disease) (HCC)   . Restless leg syndrome   . Right foot pain   . Shortness of breath on exertion   . Shoulder pain, right   . Ulcer of esophagus without bleeding     Past Surgical History:  Procedure Laterality Date  . AUGMENTATION MAMMAPLASTY Bilateral    breast lift  . BREAST EXCISIONAL BIOPSY Right   . CORONARY STENT INTERVENTION N/A 05/27/2017   Procedure: CORONARY STENT INTERVENTION;  Surgeon: Runell Gess, MD;  [facility]: [facility];  Service: Cardiovascular;  Laterality: N/A;  . FOOT SURGERY Left   . INTRAVASCULAR PRESSURE WIRE/FFR STUDY N/A 05/27/2017   Procedure: INTRAVASCULAR PRESSURE WIRE/FFR STUDY;  Surgeon: Runell Gess, MD;  [facility]: [facility];  Service: Cardiovascular;  Laterality: N/A;  . LEFT HEART CATH AND CORONARY ANGIOGRAPHY N/A 05/27/2017   Procedure: LEFT HEART CATH AND CORONARY ANGIOGRAPHY;  Surgeon: Runell Gess, MD;  [facility]: [facility];  Service: Cardiovascular;  Laterality: N/A;  . THYROIDECTOMY      Family History  Problem Relation Age of Onset  . Hyperlipidemia Mother   . Hypertension Mother   .  Diabetes Mellitus II Mother   . Cancer Father   . Hypertension Sister   . Hypertension Brother   . Hypertension Brother   . Hypertension Brother   . Hypertension Sister   . Hypertension Sister   . Heart disease Maternal Aunt     Social history:  reports that she has been smoking cigarettes. She has been smoking about 0.50 packs per day. She has never used smokeless tobacco. She reports that she does not drink alcohol or use drugs.    Allergies  Allergen Reactions  . Motrin [Ibuprofen] Hives  . Bactrim [Sulfamethoxazole-Trimethoprim] Rash  . Sulfa Antibiotics Hives and Rash    Medications:  Prior to Admission medications   Medication Sig Start Date End Date Taking? Authorizing Provider    albuterol (PROAIR HFA) 108 (90 Base) MCG/ACT inhaler Inhale 2 puffs into the lungs every 6 (six) hours as needed for wheezing or shortness of breath.   Yes [provider]  amLODipine (NORVASC) 5 MG tablet Take 5 mg by mouth daily. 05/12/17  Yes [provider]  aspirin EC 81 MG tablet Take 81 mg by mouth daily.   Yes [provider]  atorvastatin (LIPITOR) 20 MG tablet Take 20 mg by mouth daily. 05/06/17  Yes [provider]  baclofen (LIORESAL) 20 MG tablet Take 20 mg by mouth 3 (three) times daily. 05/12/17  Yes [provider]  carvedilol (COREG) 6.25 MG tablet Take 6.25 mg by mouth 2 (two) times daily. 04/09/17  Yes [provider]  clopidogrel (PLAVIX) 75 MG tablet Take 1 tablet (75 mg total) by mouth daily. 05/29/17  Yes Bhagat, Bhavinkumar, PA  DULoxetine (CYMBALTA) 30 MG capsule TAKE 1 CAPSULE (30 MG TOTAL) BY MOUTH 2 (TWO) TIMES DAILY. 02/14/18  Yes York Spaniel, MD  HYDROcodone-acetaminophen (NORCO) 7.5-325 MG tablet Take 1 tablet by mouth 4 (four) times daily as needed for moderate pain.  05/07/17  Yes [provider]  levothyroxine (SYNTHROID, LEVOTHROID) 100 MCG tablet Take 100 mcg by mouth daily.   Yes [provider]  lisinopril (PRINIVIL,ZESTRIL) 20 MG tablet Take 40 mg by mouth daily.    Yes [provider]  metoCLOPramide (REGLAN) 10 MG tablet Take 10 mg by mouth 2 (two) times daily.    Yes [provider]  metroNIDAZOLE (FLAGYL) 500 MG tablet Take 1 tablet (500 mg total) by mouth 2 (two) times daily. 05/26/18  Yes Constant, Peggy, MD  Multiple Vitamin (MULTIVITAMIN WITH MINERALS) TABS tablet Take 1 tablet by mouth daily.   Yes [provider]  nitroGLYCERIN (NITROSTAT) 0.4 MG SL tablet Place 1 tablet (0.4 mg total) under the tongue every 5 (five) minutes as needed for chest pain. 05/28/17  Yes Bhagat, Bhavinkumar, PA  pantoprazole (PROTONIX) 40 MG tablet Take 1 tablet (40 mg total)  by mouth daily. 05/29/17  Yes Bhagat, Bhavinkumar, PA  TRESIBA FLEXTOUCH 200 UNIT/ML SOPN Inject 25 Units as directed daily. 05/06/17  Yes [provider]  metFORMIN (GLUCOPHAGE) 1000 MG tablet Take 1,000 mg by mouth 2 (two) times daily. 05/06/17   [provider]    ROS:  Out of a complete 14 system review of symptoms, the patient complains only of the following symptoms, and all other reviewed systems are negative.  Joint pain, back pain Leg swelling Frequent waking  Blood pressure (!) 143/86, pulse 76, resp. rate 16, height 5\' 2"  (1.575 m), weight 146 lb (66.2 kg).  Physical Exam  General: The patient is alert and cooperative at  the time of the examination.  Neuromuscular: The patient lacks about 35 or 40 degrees of full lateral rotation of the cervical spine bilaterally.  Patient is able to flex to about 90 degrees with a low back.  Skin: No significant peripheral edema is noted.   Neurologic Exam  Mental status: The patient is alert and oriented x 3 at the time of the examination. The patient has apparent normal recent and remote memory, with an apparently normal attention span and concentration ability.   Cranial nerves: Facial symmetry is present. Speech is normal, no aphasia or dysarthria is noted. Extraocular movements are full. Visual fields are full.  Motor: The patient has good strength in all 4 extremities.  Sensory examination: Soft touch sensation is symmetric on the face, arms, and legs.  Coordination: The patient has good finger-nose-finger and heel-to-shin bilaterally.  Gait and station: The patient has a normal gait. Tandem gait is slightly unsteady. Romberg is negative. No drift is seen.  Reflexes: Deep tendon reflexes are symmetric.   MRI cervical 12/30/17:  IMPRESSION: This MRI of the cervical spine without contrast shows the following: 1. At C3-C4, there are degenerative changes causing mild spinal stenosis and mild left foraminal  narrowing but no nerve root compression. 2. 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 3. 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 4. At C6-C7, there are degenerative changes causing mild spinal stenosis and mild bilateral foraminal narrowing but no nerve root compression. Some of these changes may be more recent as there is edema within the adjacent endplates. 5. At C7-T1, there is a very small disc protrusion does not lead to any spinal stenosis or nerve root compression  * MRI scan images were reviewed online. I agree with the written report.     EMG/NCV study 12/21/17:  IMPRESSION:  Nerve conduction studies done on all 4 extremities shows mild sensory abnormalities in the lower extremities that could be consistent with a mild peripheral neuropathy.  The patient does have a history of diabetes.  EMG evaluation of the left lower extremity and the right upper extremity were unremarkable, no evidence of a radiculopathy was seen.    Assessment/Plan:  1.  Mild diabetic peripheral neuropathy  2.  Chronic neck pain  3.  Chronic right shoulder discomfort, intrinsic shoulder disease  4.  Chronic low back pain  5.  Bilateral knee pain, degenerative arthritis  The patient will have a referral to orthopedic surgery for evaluation of the bilateral knee pain and the right shoulder discomfort.  The patient will be sent for physical therapy for neuromuscular therapy on the neck and low back.  The Cymbalta will be increased to 60 mg twice daily.  The patient was sent in a prescription for the Cymbalta.  In the future, Lyrica may be used for the pain as well, gabapentin offered no benefit.  The patient will follow-up in 6  months.  The patient does have a peripheral neuropathy associated with her diabetes but this is mild.  If the back pain continues, MRI of the low back will be done in the future.  Marlan Palau MD 06/02/2018 8:08 AM  [facility] [address] [place], [place] [address]  Phone [phone] Fax [phone]

## 2018-06-02 NOTE — Patient Instructions (Signed)
We will make a referral to Orthopedic surgery for the right shoulder and for the knee pain.  Increase the Cymbalta to 30 mg in the morning and 60 mg in the evening for 1 week, then begin 60 mg twice a day.   Cymbalta (duloxetine) is an antidepressant medication that is commonly used for peripheral neuropathy pain or for fibromyalgia pain. As with any antidepressant medication, worsening depression can be seen. This medication can potentially cause headache, dizziness, sexual dysfunction, or nausea. If any problems are noted on this medication, please contact our office.   We will get physical therapy for the neck and low back.

## 2018-07-04 ENCOUNTER — Ambulatory Visit (INDEPENDENT_AMBULATORY_CARE_PROVIDER_SITE_OTHER): Payer: Medicaid Other | Admitting: Orthopaedic Surgery

## 2018-07-04 ENCOUNTER — Encounter (INDEPENDENT_AMBULATORY_CARE_PROVIDER_SITE_OTHER): Payer: Self-pay | Admitting: Orthopaedic Surgery

## 2018-07-04 ENCOUNTER — Ambulatory Visit (INDEPENDENT_AMBULATORY_CARE_PROVIDER_SITE_OTHER): Payer: Medicaid Other

## 2018-07-04 VITALS — BP 147/73 | HR 63 | Resp 16 | Ht 62.0 in | Wt 141.0 lb

## 2018-07-04 DIAGNOSIS — M25511 Pain in right shoulder: Secondary | ICD-10-CM | POA: Diagnosis not present

## 2018-07-04 DIAGNOSIS — G8929 Other chronic pain: Secondary | ICD-10-CM

## 2018-07-04 NOTE — Progress Notes (Deleted)
Office Visit Note   Patient: [name]           Date of Birth: [birth year]           MRN: [id number] Visit Date: 07/04/2018              Requested by: York Spaniel, MD [address] [place], [place] [address] PCP: Salli Real, MD   Assessment & Plan: Visit Diagnoses:  1. Chronic right shoulder pain     Plan: ***  Follow-Up Instructions: No follow-ups on file.   Orders:  Orders Placed This Encounter  Procedures  . XR Shoulder Right   No orders of the defined types were placed in this encounter.     Procedures: No procedures performed   Clinical Data: No additional findings.   Subjective: Chief Complaint  Patient presents with  . Right Shoulder - Pain  . Shoulder Pain    Right shoulder pain x 1 year, no injury, no right shoulder surgery, limited range of motion, diabetic,     HPI  Review of Systems  Constitutional: Positive for fatigue.  HENT: Negative for trouble swallowing.   Eyes: Negative for pain.  Respiratory: Negative for shortness of breath.   Cardiovascular: Positive for leg swelling.  Gastrointestinal: Negative for constipation.  Endocrine: Positive for heat intolerance.  Genitourinary: Negative for difficulty urinating.  Musculoskeletal: Positive for neck pain and neck stiffness.  Skin: Negative for rash.  Allergic/Immunologic: Negative for food allergies.  Neurological: Positive for weakness and numbness.  Hematological: Does not bruise/bleed easily.  Psychiatric/Behavioral: Positive for sleep disturbance.     Objective: Vital Signs: BP (!) 147/73 (BP Location: Right Arm, Patient Position: Sitting, Cuff Size: Normal)   Pulse 63   Resp 16   Ht 5\' 2"  (1.575 m)   Wt 141 lb (64 kg)   BMI 25.79 kg/m   Physical Exam  Ortho Exam  Specialty Comments:  No specialty comments available.  Imaging: No results found.   PMFS History: Patient Active Problem List   Diagnosis Date Noted  . Diabetic neuropathy (HCC)  06/02/2018  . H/O: hysterectomy   . Abdominal wall pain   . Shoulder pain, right   . Callus   . Arthropathy   . Fatigue   . Arterial bruit   . Barrett's esophagus   . Ulcer of esophagus without bleeding   . Right foot pain   . Inflammatory bowel disease   . Precordial pain   . Colon polyp   . Chest pain in adult   . Shortness of breath on exertion   . Restless leg syndrome   . Coronary artery disease, occlusive   . Eosinophilic esophagitis   . Gastroesophagitis   . Gastritis   . H. pylori infection   . Inlet patch of esophagus   . PVD (peripheral vascular disease) (HCC)   . Eosinophilic gastritis   . Chronic back pain   . Abdominal pain, right lower quadrant   . Hypersomnia   . Carotid stenosis, bilateral   . CAD (coronary artery disease) 05/27/2017  . Unstable angina (HCC)   . Coronary artery disease 05/25/2017  . Essential hypertension 05/25/2017  . Hyperlipidemia 05/25/2017   Past Medical History:  Diagnosis Date  . Abdominal pain, right lower quadrant   . Abdominal wall pain   . Arterial bruit   . Arthritis   . Arthropathy   . Barrett's esophagus   . Callus   . Carotid stenosis, bilateral   .  Chest pain in adult   . Chronic back pain   . Colon polyp   . Coronary artery disease, occlusive   . Diabetic neuropathy (HCC) 06/02/2018  . Eosinophilic esophagitis   . Eosinophilic gastritis   . Fatigue   . Gastritis   . Gastroesophagitis   . H. pylori infection   . H/O: hysterectomy   . Hypersomnia   . IBS (irritable bowel syndrome)   . Inflammatory bowel disease    colitis  . Inlet patch of esophagus   . Precordial pain   . PVD (peripheral vascular disease) (HCC)   . Restless leg syndrome   . Right foot pain   . Shortness of breath on exertion   . Shoulder pain, right   . Ulcer of esophagus without bleeding     Family History  Problem Relation Age of Onset  . Hyperlipidemia Mother   . Hypertension Mother   . Diabetes Mellitus II Mother   . Cancer  Father   . Hypertension Sister   . Hypertension Brother   . Hypertension Brother   . Hypertension Brother   . Hypertension Sister   . Hypertension Sister   . Heart disease Maternal Aunt     Past Surgical History:  Procedure Laterality Date  . AUGMENTATION MAMMAPLASTY Bilateral    breast lift  . BREAST EXCISIONAL BIOPSY Right   . CORONARY STENT INTERVENTION N/A 05/27/2017   Procedure: CORONARY STENT INTERVENTION;  Surgeon: Runell GessBerry, Jonathan J, MD;  Location: [facility];  Service: Cardiovascular;  Laterality: N/A;  . FOOT SURGERY Left   . INTRAVASCULAR PRESSURE WIRE/FFR STUDY N/A 05/27/2017   Procedure: INTRAVASCULAR PRESSURE WIRE/FFR STUDY;  Surgeon: Runell GessBerry, Jonathan J, MD;  Location: [facility];  Service: Cardiovascular;  Laterality: N/A;  . LEFT HEART CATH AND CORONARY ANGIOGRAPHY N/A 05/27/2017   Procedure: LEFT HEART CATH AND CORONARY ANGIOGRAPHY;  Surgeon: Runell GessBerry, Jonathan J, MD;  Location: [facility];  Service: Cardiovascular;  Laterality: N/A;  . THYROIDECTOMY     Social History   Occupational History  . Occupation: Unemployed  Tobacco Use  . Smoking status: Current Every Day Smoker    Packs/day: 0.50    Years: 25.00    Pack years: 12.50    Types: Cigarettes  . Smokeless tobacco: Never Used  . Tobacco comment: Trying to stop smoking  Substance and Sexual Activity  . Alcohol use: No    Frequency: Never  . Drug use: No  . Sexual activity: Not Currently    Birth control/protection: Surgical    Comment: Partial Hyst

## 2018-07-04 NOTE — Progress Notes (Signed)
Office Visit Note   Patient: [name]           Date of Birth: May 05, 1961           MRN: [id number] Visit Date: 07/04/2018              Requested by: York Spaniel, MD [address] [place], [place] [address] PCP: Salli Real, MD   Assessment & Plan: Visit Diagnoses:  1. Chronic right shoulder pain     Plan: Films and exam are consistent with probable rotator cuff tear right shoulder. Mrs. Dumas has a separate problem with the cervical spine with degenerative changes.  EMGs negative for any upper extremity neurologic problem.  I think the next best approach is an MRI scan right shoulder and consider treatment thereafter.  Office visit over 45 minutes 50% of the time in counseling.  Follow-Up Instructions: Return after MRI rigfht shoulder.   Orders:  Orders Placed This Encounter  Procedures  . XR Shoulder Right  . MR SHOULDER RIGHT WO CONTRAST   No orders of the defined types were placed in this encounter.     Procedures: No procedures performed   Clinical Data: No additional findings.   Subjective: Chief Complaint  Patient presents with  . Right Shoulder - Pain  . Shoulder Pain    Right shoulder pain x 1 year, no injury, no right shoulder surgery, limited range of motion, diabetic,   Mrs. Foxworthy relates about a year history of right shoulder pain without history of injury or trauma.  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  She has had a prior cardiac stent.  She has seen Dr. Anne Hahn with the EMGs negative for any upper extremity abnormalities but consistent with probable mild lower extremity diabetic neuropathy.  She is also had an MRI scan of the cervical spine revealing diffuse degenerative changes with mild stenosis but no obvious nerve root compression.  She has difficulty raising her right arm over her head and difficulty sleeping.  She seems to be able to separate the pain from  her neck from the pain in her shoulder. To date she has had a number of treatment modalities including hydrocodone with persistent discomfort to the point of compromise HPI  Review of Systems   Objective: Vital Signs: BP (!) 147/73 (BP Location: Right Arm, Patient Position: Sitting, Cuff Size: Normal)   Pulse 63   Resp 16   Ht 5\' 2"  (1.575 m)   Wt 141 lb (64 kg)   BMI 25.79 kg/m   Physical Exam  Constitutional: She is oriented to person, place, and time. She appears well-developed and well-nourished.  HENT:  Mouth/Throat: Oropharynx is clear and moist.  Eyes: Pupils are equal, round, and reactive to light. EOM are normal.  Pulmonary/Chest: Effort normal.  Neurological: She is alert and oriented to person, place, and time.  Skin: Skin is warm and dry.  Psychiatric: She has a normal mood and affect. Her behavior is normal.    Ortho Exam awake alert and oriented x3.  Comfortable sitting.  Some mild limitation of motion of cervical spine but without referred pain to either upper extremity.  She does have some mild discomfort in her cervical spine posteriorly with extension and rotation to the right.  Good grip and good release.  Right shoulder with significant pain with motion in the impingement position.  No crepitation.  Many areas of localized tenderness about the anterior  subacromial region and the acromioclavicular joint.  Skin intact.  I think she may have an element of adhesive capsulitis but motion was limited by pain.  Biceps might be in a slightly lower position with positive speeds sign.  Specialty Comments:  No specialty comments available.  Imaging: Xr Shoulder Right  Result Date: 07/04/2018 Films of the right shoulder obtained in several projections.  There appears to be a high riding humeral head with marginal space between the acromium and the humeral head of about 7 mm could be consistent with a chronic rotator cuff tear degenerative changes are also identified the  acromioclavicular joint.  No ectopic calcification.  No acute change    PMFS History: Patient Active Problem List   Diagnosis Date Noted  . Diabetic neuropathy (HCC) 06/02/2018  . H/O: hysterectomy   . Abdominal wall pain   . Shoulder pain, right   . Callus   . Arthropathy   . Fatigue   . Arterial bruit   . Barrett's esophagus   . Ulcer of esophagus without bleeding   . Right foot pain   . Inflammatory bowel disease   . Precordial pain   . Colon polyp   . Chest pain in adult   . Shortness of breath on exertion   . Restless leg syndrome   . Coronary artery disease, occlusive   . Eosinophilic esophagitis   . Gastroesophagitis   . Gastritis   . H. pylori infection   . Inlet patch of esophagus   . PVD (peripheral vascular disease) (HCC)   . Eosinophilic gastritis   . Chronic back pain   . Abdominal pain, right lower quadrant   . Hypersomnia   . Carotid stenosis, bilateral   . CAD (coronary artery disease) 05/27/2017  . Unstable angina (HCC)   . Coronary artery disease 05/25/2017  . Essential hypertension 05/25/2017  . Hyperlipidemia 05/25/2017   Past Medical History:  Diagnosis Date  . Abdominal pain, right lower quadrant   . Abdominal wall pain   . Arterial bruit   . Arthritis   . Arthropathy   . Barrett's esophagus   . Callus   . Carotid stenosis, bilateral   . Chest pain in adult   . Chronic back pain   . Colon polyp   . Coronary artery disease, occlusive   . Diabetic neuropathy (HCC) 06/02/2018  . Eosinophilic esophagitis   . Eosinophilic gastritis   . Fatigue   . Gastritis   . Gastroesophagitis   . H. pylori infection   . H/O: hysterectomy   . Hypersomnia   . IBS (irritable bowel syndrome)   . Inflammatory bowel disease    colitis  . Inlet patch of esophagus   . Precordial pain   . PVD (peripheral vascular disease) (HCC)   . Restless leg syndrome   . Right foot pain   . Shortness of breath on exertion   . Shoulder pain, right   . Ulcer of  esophagus without bleeding     Family History  Problem Relation Age of Onset  . Hyperlipidemia Mother   . Hypertension Mother   . Diabetes Mellitus II Mother   . Cancer Father   . Hypertension Sister   . Hypertension Brother   . Hypertension Brother   . Hypertension Brother   . Hypertension Sister   . Hypertension Sister   . Heart disease Maternal Aunt     Past Surgical History:  Procedure Laterality Date  . AUGMENTATION MAMMAPLASTY Bilateral  breast lift  . BREAST EXCISIONAL BIOPSY Right   . CORONARY STENT INTERVENTION N/A 05/27/2017   Procedure: CORONARY STENT INTERVENTION;  Surgeon: Runell Gess, MD;  Location: [facility];  Service: Cardiovascular;  Laterality: N/A;  . FOOT SURGERY Left   . INTRAVASCULAR PRESSURE WIRE/FFR STUDY N/A 05/27/2017   Procedure: INTRAVASCULAR PRESSURE WIRE/FFR STUDY;  Surgeon: Runell Gess, MD;  Location: [facility];  Service: Cardiovascular;  Laterality: N/A;  . LEFT HEART CATH AND CORONARY ANGIOGRAPHY N/A 05/27/2017   Procedure: LEFT HEART CATH AND CORONARY ANGIOGRAPHY;  Surgeon: Runell Gess, MD;  Location: [facility];  Service: Cardiovascular;  Laterality: N/A;  . THYROIDECTOMY     Social History   Occupational History  . Occupation: Unemployed  Tobacco Use  . Smoking status: Current Every Day Smoker    Packs/day: 0.50    Years: 25.00    Pack years: 12.50    Types: Cigarettes  . Smokeless tobacco: Never Used  . Tobacco comment: Trying to stop smoking  Substance and Sexual Activity  . Alcohol use: No    Frequency: Never  . Drug use: No  . Sexual activity: Not Currently    Birth control/protection: Surgical    Comment: Partial Hyst      Valeria Batman, MD   Note - This record has been created using Dragon software.  Chart creation errors have been sought, but may not always  have been located. Such creation errors do not reflect on  the standard of medical care.

## 2018-07-11 ENCOUNTER — Other Ambulatory Visit: Payer: Self-pay | Admitting: Physician Assistant

## 2018-07-11 NOTE — Telephone Encounter (Signed)
This is Dr. Berry's pt 

## 2018-07-13 NOTE — Telephone Encounter (Signed)
Rx request sent to pharmacy.  

## 2018-07-14 ENCOUNTER — Ambulatory Visit
Admission: RE | Admit: 2018-07-14 | Discharge: 2018-07-14 | Disposition: A | Discharge status: Home / Self Care | Payer: Medicaid Other | Source: Ambulatory Visit | Attending: Orthopaedic Surgery | Admitting: Orthopaedic Surgery

## 2018-07-14 DIAGNOSIS — G8929 Other chronic pain: Secondary | ICD-10-CM

## 2018-07-14 DIAGNOSIS — M25511 Pain in right shoulder: Principal | ICD-10-CM

## 2018-09-10 ENCOUNTER — Other Ambulatory Visit: Payer: Self-pay | Admitting: Internal Medicine

## 2018-09-10 DIAGNOSIS — R918 Other nonspecific abnormal finding of lung field: Secondary | ICD-10-CM

## 2018-09-22 ENCOUNTER — Ambulatory Visit
Admission: RE | Admit: 2018-09-22 | Discharge: 2018-09-22 | Disposition: A | Discharge status: Home / Self Care | Payer: Medicaid Other | Source: Ambulatory Visit | Attending: Internal Medicine | Admitting: Internal Medicine

## 2018-09-22 DIAGNOSIS — R918 Other nonspecific abnormal finding of lung field: Secondary | ICD-10-CM

## 2018-09-22 MED ORDER — IOPAMIDOL (ISOVUE-300) INJECTION 61%
75.0000 mL | Freq: Once | INTRAVENOUS | Status: AC | PRN
  Administered 2018-09-22: 75 mL via INTRAVENOUS

## 2018-11-21 IMAGING — CT CT PELVIS W/O CM
2 of 5 series · 15 of 46 positions shown, 17 images · non-contrast
Comparison: None.

CLINICAL DATA: Hypotension. Right groin access for cardiac
catheterization 1 day prior.

EXAM:
CT PELVIS WITHOUT CONTRAST
TECHNIQUE: Multidetector CT imaging of the pelvis was performed following the
standard protocol without intravenous contrast.

[Series 3: soft tissue · axial · 0.73mm/px · z∈[+1011,+1221]mm · 12 of 50 slices shown, 14 images]
[im 4/50  soft-tissue]
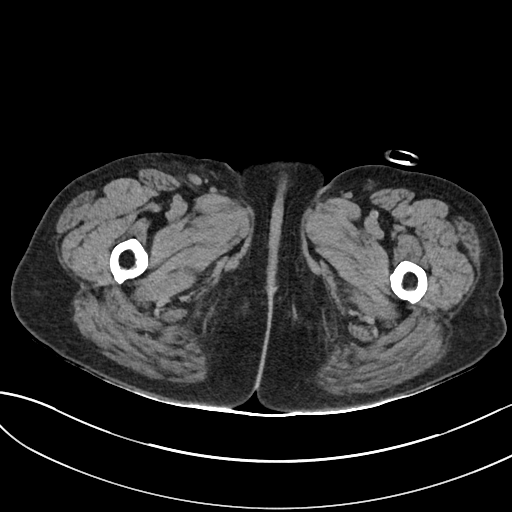
[im 4/50  bone]
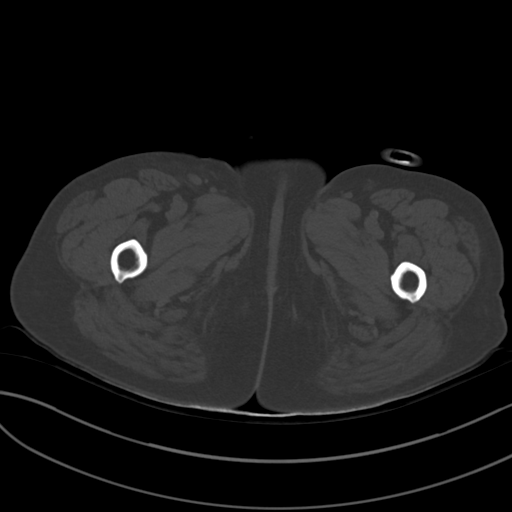
[im 7/50  soft-tissue]
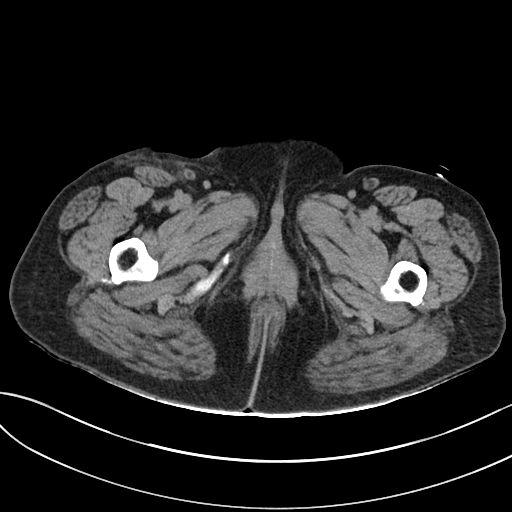
[im 12/50  soft-tissue]
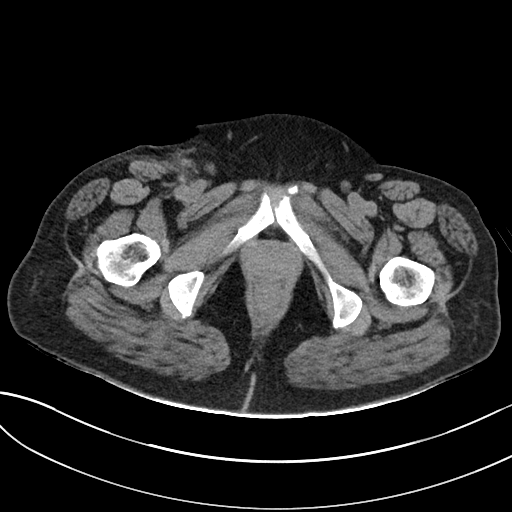
[im 15/50  soft-tissue]
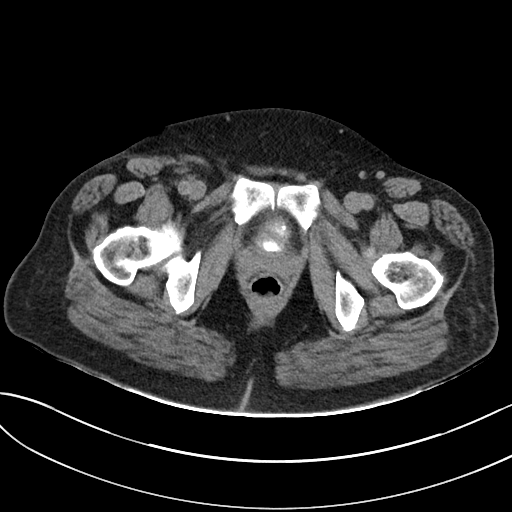
[im 19/50  soft-tissue]
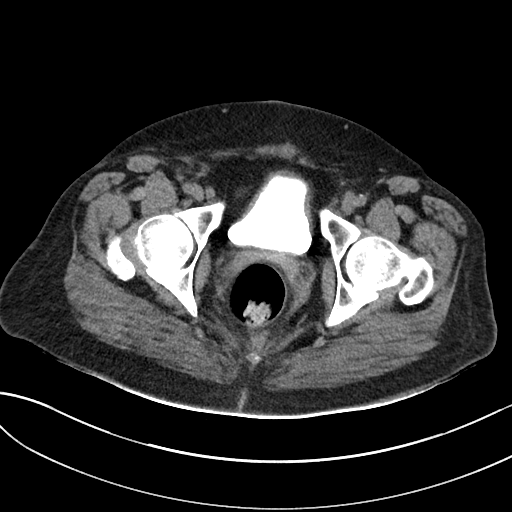
[im 23/50  soft-tissue]
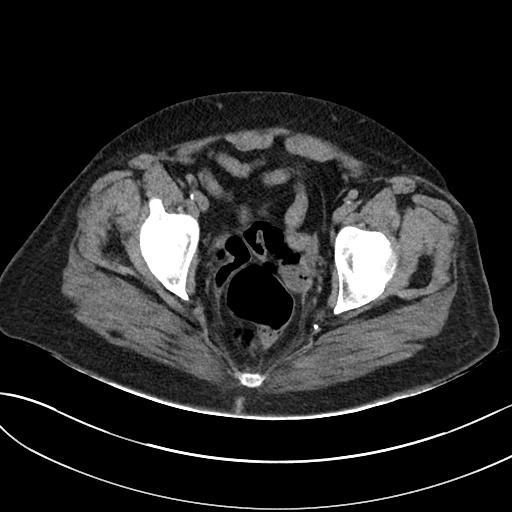
[im 27/50  soft-tissue]
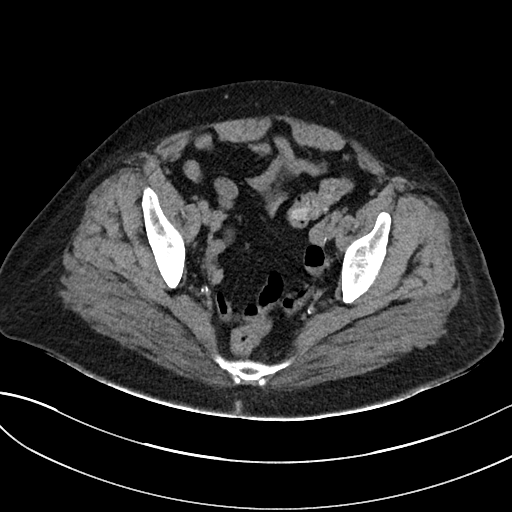
[im 31/50  soft-tissue]
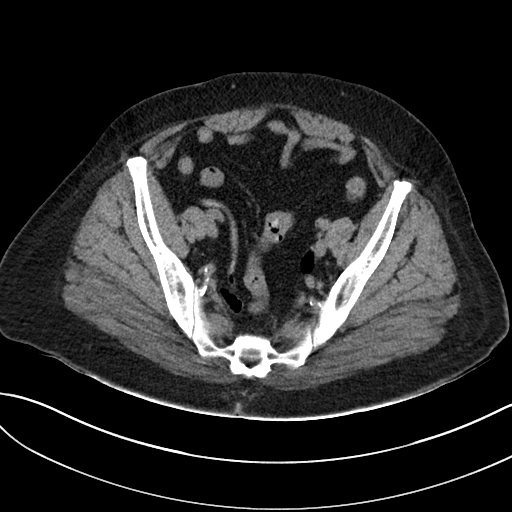
[im 35/50  soft-tissue]
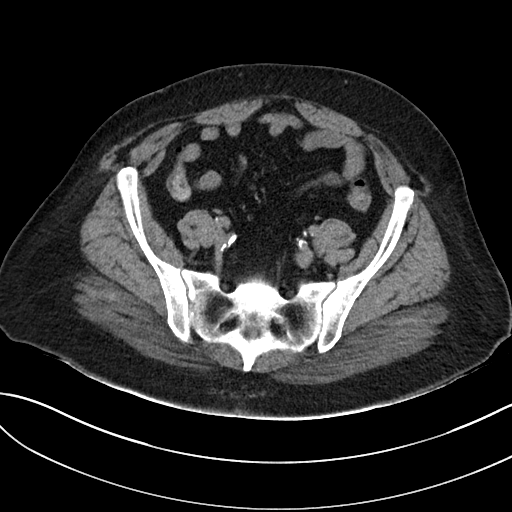
[im 35/50  bone]
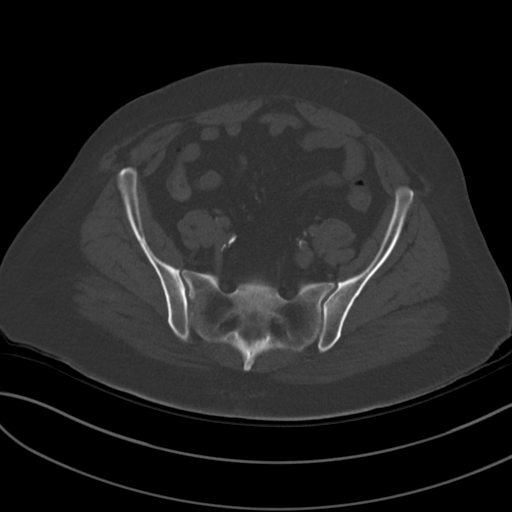
[im 38/50  soft-tissue]
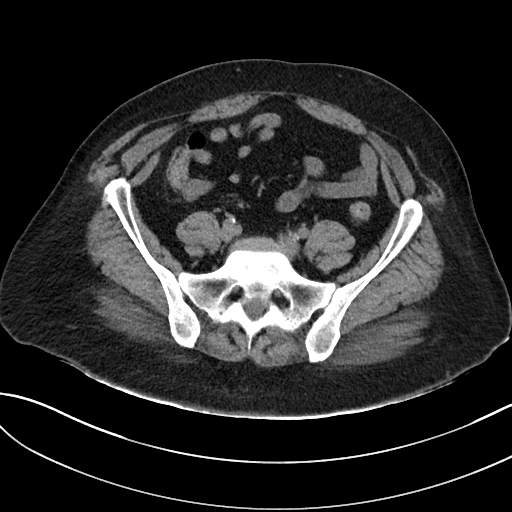
[im 43/50  soft-tissue]
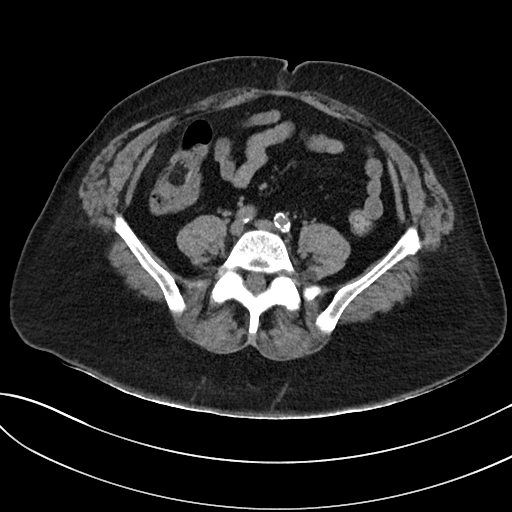
[im 46/50  soft-tissue]
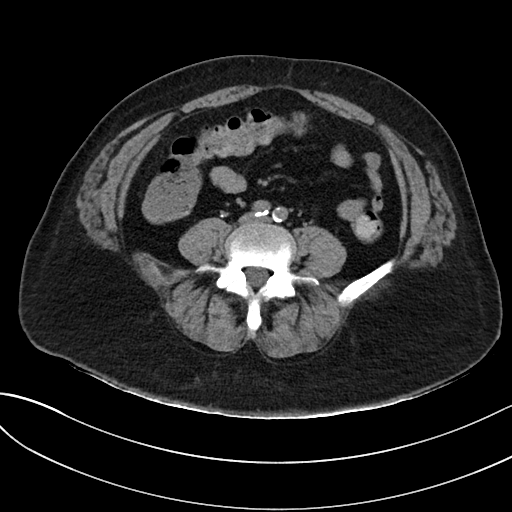

[Series 5: cor st · coronal · 0.49mm/px · 3 of 141 slices shown]
[im 36/141  soft-tissue]
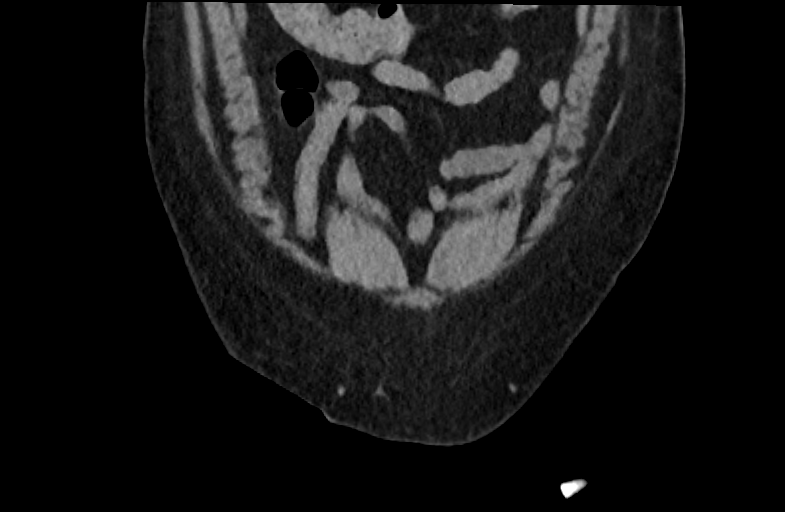
[im 71/141  soft-tissue]
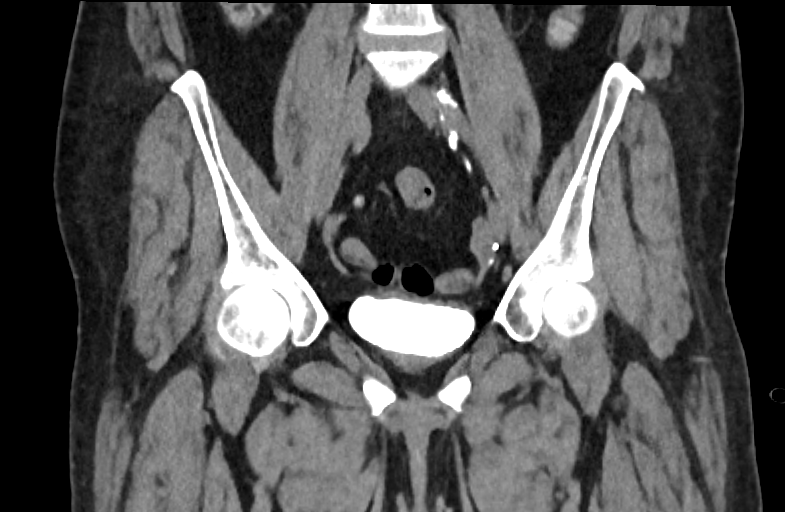
[im 106/141  soft-tissue]
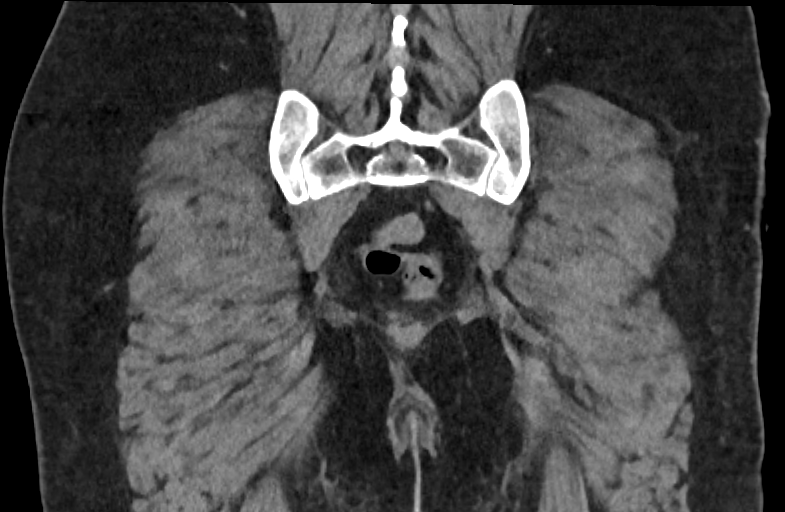

[15 of 46 positions shown; findings below may reference images not displayed]

FINDINGS: Urinary Tract: Excreted contrast is noted in the normal nondistended
bladder. Visualized distal ureters are normal caliber.

Bowel: Mild sigmoid diverticulosis. No evidence of bowel wall
thickening or pericolonic fat stranding. Normal caliber pelvic bowel
loops.

Vascular/Lymphatic: Atherosclerotic nonaneurysmal visualized lower
abdominal aorta. Iliofemoral atherosclerosis. Mild fat stranding
anterior to the right common femoral vessels without discrete fluid
collection. No pathologically enlarged pelvic lymph nodes.

Reproductive: Status post hysterectomy, with no abnormal findings at
the vaginal cuff. No adnexal mass.

Other: No evidence of retroperitoneal or extraperitoneal pelvic
hematoma.

Musculoskeletal: No suspicious bone lesions identified.
IMPRESSION: 1. No evidence of retroperitoneal or extraperitoneal pelvic
hematoma. Expected mild post procedure fat stranding anterior to the
right common femoral vessels without focal fluid collection.
2. Mild sigmoid diverticulosis.
3.  Aortic Atherosclerosis ([id number]).

## 2018-11-28 ENCOUNTER — Ambulatory Visit (INDEPENDENT_AMBULATORY_CARE_PROVIDER_SITE_OTHER): Payer: Medicaid Other | Admitting: Neurology

## 2018-11-28 ENCOUNTER — Encounter: Payer: Self-pay | Admitting: Neurology

## 2018-11-28 ENCOUNTER — Other Ambulatory Visit: Payer: Self-pay

## 2018-11-28 DIAGNOSIS — M25511 Pain in right shoulder: Secondary | ICD-10-CM

## 2018-11-28 DIAGNOSIS — M545 Low back pain: Secondary | ICD-10-CM

## 2018-11-28 DIAGNOSIS — G8929 Other chronic pain: Secondary | ICD-10-CM | POA: Diagnosis not present

## 2018-11-28 MED ORDER — PREGABALIN 50 MG PO CAPS: 50.0000 mg | ORAL_CAPSULE | Freq: Two times a day (BID) | ORAL | 2 refills | Status: DC

## 2018-11-28 NOTE — Progress Notes (Signed)
I have read the note, and I agree with the clinical assessment and plan.  Ivon Roedel K Vlasta Baskin   

## 2018-11-28 NOTE — Progress Notes (Signed)
Virtual Visit via Telephone Note  I connected with [name] on 11/28/18 at  2:45 PM EDT by telephone and verified that I am speaking with the correct person using two identifiers.   I discussed the limitations, risks, security and privacy concerns of performing an evaluation and management service by telephone and the availability of in person appointments. I also discussed with the patient that there may be a patient responsible charge related to this service. The patient expressed understanding and agreed to proceed.   History of Present Illness: 11/28/2018 SS: [name] is a 58 year old female with a history of mild diabetic peripheral neuropathy, low back pain, right shoulder pain.  She reports most bothersome is her right shoulder pain, she is not able to raise her right shoulder.  At her last visit she was sent to see Dr. Cleophas Dunker, [facility].  She had MRI of the right shoulder, showing full-thickness, partial width tear of the supra spinatus tendon.  She says she was never contacted for physical therapy for gait training.  She currently does not drive, she lives with her daughter.  She does use a cane for ambulating.  In the past gabapentin has not been beneficial.  She reports chronic low back,  will have some burning to her left leg.  She does report in the past few months she has had 2 falls related to her balance.  She denies bowel or bladder incontinence.    06/02/2018 Dr. Anne Hahn: [name] is a 58 year old right-handed black female with a history of diabetes and a mild diabetic peripheral neuropathy.  The patient mainly is concerned about her chronic neck pain, the patient has significant right shoulder pain as well likely related to intrinsic right shoulder disease.  The patient has significant pain with elevation of the arm, she is not able to get the arm up more than 80 or 90 degrees with abduction.  The patient also reports bilateral knee pain with crepitus in the knees  when she tries to walk.  She is currently bothered by significant low back pain without radiation down the legs.  The patient has had MRI of the cervical spine that does not show nerve root compression, EMG of the arm and leg did not show evidence of a radiculopathy.  The patient does have some straightening of the spine of the neck, she likely has spasm.  The patient is on baclofen taking 60 mg total daily.  She is on Cymbalta taking 30 mg twice daily.  She continues to have a lot of neck stiffness and stiffness in the low back.  She tries to walk and stretch some, she has not been through any physical therapy recently.  In the past, gabapentin has not been very effective for her pain.  She returns to this office for an evaluation.   Observations/Objective: This visit was done via telephone call  Alert, answers questions appropriately  Assessment and Plan: 1.  Peripheral neuropathy 2.  Chronic right shoulder discomfort 3.  Chronic low back pain  Her shoulder is of most concern to her today.  She had MRI of her right shoulder in December 2019.  She will follow-up with [facility] to discuss next steps.  She continues to complain of low back pain.  In the past gabapentin has not been helpful.  She will try Lyrica 50 mg twice daily.  She will continue taking Cymbalta 60 mg twice daily.  Discussed side effects of the medication.  She has complained  of some burning to her left leg, she has had 2 falls in the last few months.  This visit was done over telephone, we should see her in the office in the next 3 months for exam and to discuss getting MRI of the low back .  At that time we should discuss physical therapy for gait training as it will be difficult to get started at this time due to the coronavirus pandemic.  Follow Up Instructions: 3 months    I discussed the assessment and treatment plan with the patient. The patient was provided an opportunity to ask questions and all were answered.  The patient agreed with the plan and demonstrated an understanding of the instructions.   The patient was advised to call back or seek an in-person evaluation if the symptoms worsen or if the condition fails to improve as anticipated.  I provided 25 minutes of non-face-to-face time during this encounter.   Otila Kluver, DNP  [facility] [address] [place], [place] [address] [phone]

## 2018-12-01 ENCOUNTER — Other Ambulatory Visit: Payer: Self-pay

## 2018-12-01 ENCOUNTER — Encounter (INDEPENDENT_AMBULATORY_CARE_PROVIDER_SITE_OTHER): Payer: Self-pay | Admitting: Orthopaedic Surgery

## 2018-12-01 ENCOUNTER — Ambulatory Visit: Payer: Medicaid Other | Admitting: Adult Health

## 2018-12-01 ENCOUNTER — Ambulatory Visit: Payer: Medicaid Other | Admitting: Neurology

## 2018-12-01 ENCOUNTER — Ambulatory Visit (INDEPENDENT_AMBULATORY_CARE_PROVIDER_SITE_OTHER): Payer: Medicaid Other | Admitting: Orthopaedic Surgery

## 2018-12-01 VITALS — BP 142/81 | HR 74 | Ht 62.0 in | Wt 152.0 lb

## 2018-12-01 DIAGNOSIS — M25511 Pain in right shoulder: Secondary | ICD-10-CM | POA: Diagnosis not present

## 2018-12-01 DIAGNOSIS — G8929 Other chronic pain: Secondary | ICD-10-CM | POA: Diagnosis not present

## 2018-12-01 NOTE — Progress Notes (Signed)
Office Visit Note   Patient: [name]           Date of Birth: 07-Feb-1961           MRN: [id number] Visit Date: 12/01/2018              Requested by: Debra Real, MD [address] [place], [place] [address] PCP: Debra Real, MD   Assessment & Plan: Visit Diagnoses:  1. Chronic right shoulder pain     Plan: MRI scan demonstrates a full-thickness tear of the distal supraspinatus with one point sign of tear measures approximately 8 mm in width.  There is some bursal surface fraying of the infraspinatus.  The subscap and teres minor are unremarkable biceps long head is intact.  The [facility] joint with a type II acromium long discussion over 30 minutes regarding the above.  I would suggest an arthroscopic SCD DCR and mini open rotator cuff tear repair.  [name] would like to proceed.  Once the operating room is reopened we will schedule this for her.  I discussed the surgery detail regarding the incisions outpatient nature of the use of a sling the potential for adhesive capsulitis and the need for therapy of also discussed the possibility of incomplete pain relief and re-tear.  Does appear to have some adhesive capsulitis at present related to her chronic pain  Follow-Up Instructions: No follow-ups on file.   Orders:  No orders of the defined types were placed in this encounter.  No orders of the defined types were placed in this encounter.     Procedures: No procedures performed   Clinical Data: No additional findings.   Subjective: Chief Complaint  Patient presents with  . Right Shoulder - Follow-up  Patient presents today for a 5 month follow up on her right shoulder. She had an MRI on her right shoulder on 07/14/18, but never received the results. Patient states that her shoulder is still painful. She is taking hydrocodone 5/325 as prescribed by her PCP. She has numbness and tingling in her left hand, not the right. She has a difficult time sleeping at night.            HPI  Review of Systems  Constitutional: Positive for fatigue.  HENT: Negative for ear pain.   Eyes: Negative for pain.  Respiratory: Negative for shortness of breath.   Cardiovascular: Negative for leg swelling.  Gastrointestinal: Negative for constipation and diarrhea.  Endocrine: Positive for heat intolerance. Negative for cold intolerance.  Genitourinary: Negative for difficulty urinating.  Musculoskeletal: Positive for joint swelling.  Skin: Negative for rash.  Allergic/Immunologic: Negative for food allergies.  Neurological: Negative for weakness.  Hematological: Does not bruise/bleed easily.  Psychiatric/Behavioral: Positive for sleep disturbance.     Objective: Vital Signs: BP (!) 142/81   Pulse 74   Ht 5\' 2"  (1.575 m)   Wt 152 lb (68.9 kg)   BMI 27.80 kg/m   Physical Exam Constitutional:      Appearance: She is well-developed.  Eyes:     Pupils: Pupils are equal, round, and reactive to light.  Pulmonary:     Effort: Pulmonary effort is normal.  Skin:    General: Skin is warm and dry.  Neurological:     Mental Status: She is alert and oriented to person, place, and time.  Psychiatric:        Behavior: Behavior normal.     Ortho Exam awake alert and oriented x3.  Appears to have some loss  of overhead motion passively consistent with adhesive capsulitis.  She is diabetic.  Has positive impingement and empty can testing.  Appears to have good strength but pain with external rotation.  Good grip and good release.  Skin intact.  Does have some pain along the anterior subacromial region  Specialty Comments:  No specialty comments available.  Imaging: No results found.   PMFS History: Patient Active Problem List   Diagnosis Date Noted  . Diabetic neuropathy (HCC) 06/02/2018  . H/O: hysterectomy   . Abdominal wall pain   . Shoulder pain, right   . Callus   . Arthropathy   . Fatigue   . Arterial bruit   . Barrett's esophagus   . Ulcer of esophagus  without bleeding   . Right foot pain   . Inflammatory bowel disease   . Precordial pain   . Colon polyp   . Chest pain in adult   . Shortness of breath on exertion   . Restless leg syndrome   . Coronary artery disease, occlusive   . Eosinophilic esophagitis   . Gastroesophagitis   . Gastritis   . H. pylori infection   . Inlet patch of esophagus   . PVD (peripheral vascular disease) (HCC)   . Eosinophilic gastritis   . Chronic back pain   . Abdominal pain, right lower quadrant   . Hypersomnia   . Carotid stenosis, bilateral   . CAD (coronary artery disease) 05/27/2017  . Unstable angina (HCC)   . Coronary artery disease 05/25/2017  . Essential hypertension 05/25/2017  . Hyperlipidemia 05/25/2017   Past Medical History:  Diagnosis Date  . Abdominal pain, right lower quadrant   . Abdominal wall pain   . Arterial bruit   . Arthritis   . Arthropathy   . Barrett's esophagus   . Callus   . Carotid stenosis, bilateral   . Chest pain in adult   . Chronic back pain   . Colon polyp   . Coronary artery disease, occlusive   . Diabetic neuropathy (HCC) 06/02/2018  . Eosinophilic esophagitis   . Eosinophilic gastritis   . Fatigue   . Gastritis   . Gastroesophagitis   . H. pylori infection   . H/O: hysterectomy   . Hypersomnia   . IBS (irritable bowel syndrome)   . Inflammatory bowel disease    colitis  . Inlet patch of esophagus   . Precordial pain   . PVD (peripheral vascular disease) (HCC)   . Restless leg syndrome   . Right foot pain   . Shortness of breath on exertion   . Shoulder pain, right   . Ulcer of esophagus without bleeding     Family History  Problem Relation Age of Onset  . Hyperlipidemia Mother   . Hypertension Mother   . Diabetes Mellitus II Mother   . Cancer Father   . Hypertension Sister   . Hypertension Brother   . Hypertension Brother   . Hypertension Brother   . Hypertension Sister   . Hypertension Sister   . Heart disease Maternal Aunt      Past Surgical History:  Procedure Laterality Date  . AUGMENTATION MAMMAPLASTY Bilateral    breast lift  . BREAST EXCISIONAL BIOPSY Right   . CORONARY STENT INTERVENTION N/A 05/27/2017   Procedure: CORONARY STENT INTERVENTION;  Surgeon: Runell Gess, MD;  Location: [facility];  Service: Cardiovascular;  Laterality: N/A;  . FOOT SURGERY Left   . INTRAVASCULAR PRESSURE WIRE/FFR STUDY  N/A 05/27/2017   Procedure: INTRAVASCULAR PRESSURE WIRE/FFR STUDY;  Surgeon: Runell GessBerry, Jonathan J, MD;  Location: [facility];  Service: Cardiovascular;  Laterality: N/A;  . LEFT HEART CATH AND CORONARY ANGIOGRAPHY N/A 05/27/2017   Procedure: LEFT HEART CATH AND CORONARY ANGIOGRAPHY;  Surgeon: Runell GessBerry, Jonathan J, MD;  Location: [facility];  Service: Cardiovascular;  Laterality: N/A;  . THYROIDECTOMY     Social History   Occupational History  . Occupation: Unemployed  Tobacco Use  . Smoking status: Current Every Day Smoker    Packs/day: 0.50    Years: 25.00    Pack years: 12.50    Types: Cigarettes  . Smokeless tobacco: Never Used  . Tobacco comment: Trying to stop smoking  Substance and Sexual Activity  . Alcohol use: No    Frequency: Never  . Drug use: No  . Sexual activity: Not Currently    Birth control/protection: Surgical    Comment: Partial Hyst

## 2018-12-09 ENCOUNTER — Telehealth: Payer: Self-pay | Admitting: Neurology

## 2018-12-09 NOTE — Telephone Encounter (Signed)
Pt states that ever since she started to take pregabalin (LYRICA) 50 MG capsule she has been sleeping a lot more than usual and her hands shake when she is holding things. Pt would like a call back to see if there is something else she can take or something else she can do. Please advise.

## 2018-12-13 MED ORDER — PREGABALIN 25 MG PO CAPS: 25.0000 mg | ORAL_CAPSULE | Freq: Two times a day (BID) | ORAL | 3 refills | Status: DC

## 2018-12-13 NOTE — Telephone Encounter (Signed)
I called the patient.  She is currently taking Lyrica 50 mg twice a day.  She reports it has been helpful for her neuropathy and low back pain, but she says she has had some shaking in her hands and feeling sleepy.  We will decrease her dose taking 25 mg twice a day.  She would like to try this.  She also reports she saw the orthopedic doctor, is planning to have surgery on her shoulder.

## 2018-12-13 NOTE — Telephone Encounter (Signed)
I called the patient back and left a message regarding her concern about Lyrica. I asked her to call our office back to discuss her concerns.

## 2018-12-13 NOTE — Addendum Note (Signed)
Addended by: Glean Salvo on: 12/13/2018 12:10 PM   Modules accepted: Orders

## 2018-12-13 NOTE — Telephone Encounter (Signed)
Pt returned call. Please call back as soon as available.  °

## 2018-12-19 ENCOUNTER — Other Ambulatory Visit: Payer: Self-pay | Admitting: Neurology

## 2018-12-19 DIAGNOSIS — G8929 Other chronic pain: Secondary | ICD-10-CM

## 2019-01-23 ENCOUNTER — Telehealth: Payer: Self-pay | Admitting: Orthopaedic Surgery

## 2019-01-23 NOTE — Telephone Encounter (Signed)
Patient has not been cleared by Dr. Lawson Radar for her right shoulder arthroscopy.  I spoke with patient today and she stated that she is waiting on call from Dr. Verdon Cummins office to schedule a stress test.

## 2019-01-24 ENCOUNTER — Ambulatory Visit: Admit: 2019-01-24 | Payer: Medicaid Other | Admitting: Orthopaedic Surgery

## 2019-01-24 SURGERY — SHOULDER ARTHROSCOPY WITH DISTAL CLAVICLE RESECTION
Anesthesia: Choice | Laterality: Right

## 2019-01-24 NOTE — Telephone Encounter (Signed)
thanks

## 2019-01-24 NOTE — Telephone Encounter (Signed)
Just an FYI

## 2019-04-04 ENCOUNTER — Other Ambulatory Visit (HOSPITAL_COMMUNITY): Payer: Self-pay | Admitting: Family Medicine

## 2019-04-04 DIAGNOSIS — I252 Old myocardial infarction: Secondary | ICD-10-CM

## 2019-04-07 ENCOUNTER — Other Ambulatory Visit (HOSPITAL_COMMUNITY): Payer: Medicaid Other

## 2019-04-12 ENCOUNTER — Other Ambulatory Visit: Payer: Self-pay | Admitting: Internal Medicine

## 2019-04-12 DIAGNOSIS — Z1231 Encounter for screening mammogram for malignant neoplasm of breast: Secondary | ICD-10-CM

## 2019-04-16 DIAGNOSIS — R079 Chest pain, unspecified: Secondary | ICD-10-CM | POA: Diagnosis not present

## 2019-04-17 ENCOUNTER — Ambulatory Visit (HOSPITAL_COMMUNITY)
Admission: RE | Admit: 2019-04-17 | Discharge: 2019-04-17 | Disposition: A | Discharge status: Home / Self Care | Payer: Medicaid Other | Source: Ambulatory Visit | Attending: Family Medicine | Admitting: Family Medicine

## 2019-04-17 ENCOUNTER — Telehealth: Payer: Self-pay | Admitting: Physician Assistant

## 2019-04-17 ENCOUNTER — Other Ambulatory Visit: Payer: Self-pay

## 2019-04-17 DIAGNOSIS — I252 Old myocardial infarction: Secondary | ICD-10-CM

## 2019-04-17 LAB — NM MYOCAR MULTI W/SPECT W/WALL MOTION / EF
Estimated workload: 1 METS
MPHR: 161 {beats}/min
Peak HR: 89 {beats}/min
Percent HR: 55 %
Rest HR: 62 {beats}/min

## 2019-04-17 MED ORDER — TECHNETIUM TC 99M TETROFOSMIN IV KIT
30.0000 | PACK | Freq: Once | INTRAVENOUS | Status: AC | PRN
  Administered 2019-04-17: 30 via INTRAVENOUS

## 2019-04-17 MED ORDER — REGADENOSON 0.4 MG/5ML IV SOLN
0.4000 mg | Freq: Once | INTRAVENOUS | Status: AC
  Administered 2019-04-17: 0.4 mg via INTRAVENOUS

## 2019-04-17 MED ORDER — REGADENOSON 0.4 MG/5ML IV SOLN
INTRAVENOUS | Status: AC
  Administered 2019-04-17: 0.4 mg
  Filled 2019-04-17: qty 5

## 2019-04-17 MED ORDER — TECHNETIUM TC 99M TETROFOSMIN IV KIT
10.1000 | PACK | Freq: Once | INTRAVENOUS | Status: AC | PRN
  Administered 2019-04-17: 10.1 via INTRAVENOUS

## 2019-04-17 NOTE — Telephone Encounter (Signed)
   Pt presented for nuclear medicine stress test today, ordered by Dr. Claudie Leach as outpatient. Done in hospital setting, proctored in conjunction with APP colleague Cadence Kathlen Mody. OP note reviewed. Pt tolerated test well. [facility] is assigned to read. Further management of results per Dr. Claudie Leach. Henry Demeritt PA-C

## 2019-05-01 ENCOUNTER — Other Ambulatory Visit: Payer: Self-pay

## 2019-05-01 ENCOUNTER — Encounter (HOSPITAL_COMMUNITY): Payer: Self-pay | Admitting: Emergency Medicine

## 2019-05-01 ENCOUNTER — Emergency Department (HOSPITAL_COMMUNITY)
Admission: EM | Admit: 2019-05-01 | Discharge: 2019-05-02 | Discharge status: Against Medical Advice | Payer: Medicaid Other | Attending: Emergency Medicine | Admitting: Emergency Medicine

## 2019-05-01 ENCOUNTER — Emergency Department (HOSPITAL_COMMUNITY): Payer: Medicaid Other

## 2019-05-01 DIAGNOSIS — R05 Cough: Secondary | ICD-10-CM | POA: Diagnosis present

## 2019-05-01 DIAGNOSIS — Z5321 Procedure and treatment not carried out due to patient leaving prior to being seen by health care provider: Secondary | ICD-10-CM | POA: Diagnosis not present

## 2019-05-01 NOTE — ED Triage Notes (Signed)
Patient here from home with complaints of cough increased at night x34month. Reports that she has mold inside her home and is concerned.

## 2019-05-12 ENCOUNTER — Other Ambulatory Visit (HOSPITAL_COMMUNITY): Payer: Medicaid Other

## 2019-05-16 ENCOUNTER — Ambulatory Visit: Admit: 2019-05-16 | Payer: Medicaid Other | Admitting: Orthopaedic Surgery

## 2019-05-16 SURGERY — SHOULDER ARTHROSCOPY WITH OPEN ROTATOR CUFF REPAIR AND DISTAL CLAVICLE ACROMINECTOMY
Anesthesia: Choice | Laterality: Right

## 2019-05-25 ENCOUNTER — Inpatient Hospital Stay: Payer: Medicaid Other | Admitting: Orthopaedic Surgery

## 2019-05-26 ENCOUNTER — Other Ambulatory Visit: Payer: Self-pay

## 2019-05-26 ENCOUNTER — Ambulatory Visit
Admission: RE | Admit: 2019-05-26 | Discharge: 2019-05-26 | Disposition: A | Discharge status: Home / Self Care | Payer: Medicaid Other | Source: Ambulatory Visit | Attending: Internal Medicine | Admitting: Internal Medicine

## 2019-05-26 DIAGNOSIS — Z1231 Encounter for screening mammogram for malignant neoplasm of breast: Secondary | ICD-10-CM

## 2019-09-16 ENCOUNTER — Other Ambulatory Visit: Payer: Self-pay | Admitting: Cardiovascular Disease

## 2019-11-10 ENCOUNTER — Telehealth: Payer: Self-pay | Admitting: Orthopaedic Surgery

## 2019-11-10 NOTE — Telephone Encounter (Signed)
FYI

## 2019-11-10 NOTE — Telephone Encounter (Signed)
Patient left message on voice mail wanting to schedule shoulder surgery.  It has been quite some time since she was seen last. (April 2020).  Surgery sheet for right shoulder arthroscopy with mini open rotator cuff repair was completed on 12-01-2018 and indicated surgery should be done at [facility] as overnight observation.  Patient has medicaid coverage.  I called patient and scheduled a return office visit to discuss shoulder surgery.  Patient stated she took a fall in November of 2020, which has caused increased pain for the right shoulder since seen her last visit. Patient has held off on scheduling due to Covid. Looks like we requested clearance from  PCP Dr. Salli Real and Cardiologist Dr Lollie Marrow, however these clearance are older than 6 months.

## 2019-11-10 NOTE — Telephone Encounter (Signed)
Thanks-she does need a return to office before scheduling surgery

## 2019-11-21 ENCOUNTER — Other Ambulatory Visit: Payer: Self-pay

## 2019-11-21 ENCOUNTER — Ambulatory Visit: Payer: Self-pay

## 2019-11-21 ENCOUNTER — Encounter: Payer: Self-pay | Admitting: Orthopaedic Surgery

## 2019-11-21 ENCOUNTER — Ambulatory Visit: Payer: Medicaid Other | Admitting: Orthopaedic Surgery

## 2019-11-21 ENCOUNTER — Ambulatory Visit (INDEPENDENT_AMBULATORY_CARE_PROVIDER_SITE_OTHER): Payer: Medicaid Other

## 2019-11-21 DIAGNOSIS — G8929 Other chronic pain: Secondary | ICD-10-CM

## 2019-11-21 DIAGNOSIS — M25511 Pain in right shoulder: Secondary | ICD-10-CM

## 2019-11-21 NOTE — Progress Notes (Signed)
Office Visit Note   Patient: [name] Salas           Date of Birth: July 13, 1961           MRN: [id number] Visit Date: 11/21/2019              Requested by: Salli Real, MD [address] [place],  [place] [address] PCP: Salli Real, MD   Assessment & Plan: Visit Diagnoses:  1. Chronic right shoulder pain     Plan: [name] Salas is accompanied by her daughter and here for reevaluation the problem she has with her right shoulder.  In December 2019 she had an MRI scan of the shoulder demonstrating a full-thickness partial width tear of the mid to distal supraspinatus with one-point centimeters of retraction.  The tear measured 8 mm in width.  There was some bursal surface fraying of the distal infraspinatus.  The subscap and teres minor were unremarkable.  No muscle edema.  There was some mild fatty infiltration of the supra and infraspinatus muscles without atrophy.  Biceps long head was intact.  There was mild arthropathy of the [facility] joint with a type II acromium and no evidence of significant subacromial bursal fluid.  Mild diffuse cartilage thinning without focal defect in the glenohumeral joint.  [name] Salas was scheduled to have shoulder surgery in October but she canceled.  Her daughter is her caregiver and was working at the time.  [name] Salas also has a significant cardiac history having had an MI 4 years ago with coronary stents.  She had been cleared for surgery.  She also was diabetic.  [name] Salas would like to proceed with shoulder surgery.  She has developed mild adhesive capsulitis and still having difficulty raising her arm over her head.  From her primary care physician she has been on hydrocodone 7.5 mg up to 3 times a day for various problems including arthritis.  I had a further discussion with her regarding the difficulty will have postoperatively with pain control because she has been on the chronic pain pills.  Her daughter is a Lawyer and understands.  I think with before we proceed with  surgery she will need repeat cardiac clearance and I would like to try a course of physical therapy to see if we can regain some motion.  I think it is going to be a very difficult postoperative course for her  Follow-Up Instructions: Return We will schedule rotator cuff tear repair right shoulder.   Orders:  Orders Placed This Encounter  Procedures  . XR Shoulder Right  . Ambulatory referral to Physical Therapy   No orders of the defined types were placed in this encounter.     Procedures: No procedures performed   Clinical Data: No additional findings.   Subjective: Chief Complaint  Patient presents with  . Right Shoulder - Pain, Follow-up  Patient presents today for her right shoulder. Her last visit was a year ago and at that visit she discussed surgery with Dr.Jonan Seufert. She was scheduled, but then it was cancelled. She continues to hurt. She is taking hydrocodone 7.5-325. Her pain is located anteriorly and superiorly. She is unable to raise her arm. She slipped and fell over the winter when there ice on the ground and landed on her right shoulder. She is right hand dominant.   HPI  Review of Systems   Objective: Vital Signs: There were no vitals taken for this visit.  Physical Exam Constitutional:      Appearance: She  is well-developed.  Eyes:     Pupils: Pupils are equal, round, and reactive to light.  Pulmonary:     Effort: Pulmonary effort is normal.  Skin:    General: Skin is warm and dry.  Neurological:     Mental Status: She is alert and oriented to person, place, and time.  Psychiatric:        Behavior: Behavior normal.     Ortho Exam poor historian.  [name] Salas could actively elevate her shoulder about 110 degrees and abduct about 80 degrees at which point she was uncomfortable.  Passively I could take her little bit further may be 130 degrees.  I was not sure if she was just having pain or true adhesive capsulitis.  She does have pain with  internal/external rotation consistent with positive impingement and a positive empty can test.  Thought she had good strength with internal rotation and some pain with external rotation but was not sure if it was muscle weakness or pain.  Good grip and good release.  No grating.  Biceps appears to be intact.  No neck pain Specialty Comments:  No specialty comments available.  Imaging: XR Shoulder Right  Result Date: 11/21/2019 Films of the right shoulder obtained in several projections.  There may be slight elevation of the humeral head and the glenoid.  I think the space between the humeral head and the acromium is normal but probably upper limits of normal.  No ectopic calcification.  There is lateral curvature of the acromion.  There are some degenerative changes of the [facility] joint.  No ectopic calcification.  No obvious arthritis of the glenohumeral joint.  Patient has a history of a rotator cuff tear    PMFS History: Patient Active Problem List   Diagnosis Date Noted  . Diabetic neuropathy (HCC) 06/02/2018  . H/O: hysterectomy   . Abdominal wall pain   . Shoulder pain, right   . Callus   . Arthropathy   . Fatigue   . Arterial bruit   . Barrett's esophagus   . Ulcer of esophagus without bleeding   . Right foot pain   . Inflammatory bowel disease   . Precordial pain   . Colon polyp   . Chest pain in adult   . Shortness of breath on exertion   . Restless leg syndrome   . Coronary artery disease, occlusive   . Eosinophilic esophagitis   . Gastroesophagitis   . Gastritis   . H. pylori infection   . Inlet patch of esophagus   . PVD (peripheral vascular disease) (HCC)   . Eosinophilic gastritis   . Chronic back pain   . Abdominal pain, right lower quadrant   . Hypersomnia   . Carotid stenosis, bilateral   . CAD (coronary artery disease) 05/27/2017  . Unstable angina (HCC)   . Coronary artery disease 05/25/2017  . Essential hypertension 05/25/2017  . Hyperlipidemia 05/25/2017    Past Medical History:  Diagnosis Date  . Abdominal pain, right lower quadrant   . Abdominal wall pain   . Arterial bruit   . Arthritis   . Arthropathy   . Barrett's esophagus   . Callus   . Carotid stenosis, bilateral   . Chest pain in adult   . Chronic back pain   . Colon polyp   . Coronary artery disease, occlusive   . Diabetic neuropathy (HCC) 06/02/2018  . Eosinophilic esophagitis   . Eosinophilic gastritis   . Fatigue   . Gastritis   .  Gastroesophagitis   . H. pylori infection   . H/O: hysterectomy   . Hypersomnia   . IBS (irritable bowel syndrome)   . Inflammatory bowel disease    colitis  . Inlet patch of esophagus   . Precordial pain   . PVD (peripheral vascular disease) ([place])   . Restless leg syndrome   . Right foot pain   . Shortness of breath on exertion   . Shoulder pain, right   . Ulcer of esophagus without bleeding     Family History  Problem Relation Age of Onset  . Hyperlipidemia Mother   . Hypertension Mother   . Diabetes Mellitus II Mother   . Cancer Father   . Hypertension Sister   . Hypertension Brother   . Hypertension Brother   . Hypertension Brother   . Hypertension Sister   . Hypertension Sister   . Heart disease Maternal Aunt     Past Surgical History:  Procedure Laterality Date  . AUGMENTATION MAMMAPLASTY Bilateral    breast lift  . BREAST EXCISIONAL BIOPSY Right   . CORONARY STENT INTERVENTION N/A 05/27/2017   Procedure: CORONARY STENT INTERVENTION;  Surgeon: Lorretta Harp, MD;  Location: [place] [facility];  Service: Cardiovascular;  Laterality: N/A;  . FOOT SURGERY Left   . INTRAVASCULAR PRESSURE WIRE/FFR STUDY N/A 05/27/2017   Procedure: INTRAVASCULAR PRESSURE WIRE/FFR STUDY;  Surgeon: Lorretta Harp, MD;  Location: [place] [facility];  Service: Cardiovascular;  Laterality: N/A;  . LEFT HEART CATH AND CORONARY ANGIOGRAPHY N/A 05/27/2017   Procedure: LEFT HEART CATH AND CORONARY ANGIOGRAPHY;  Surgeon: Lorretta Harp, MD;  Location: [place] [facility];  Service: Cardiovascular;  Laterality: N/A;  . THYROIDECTOMY     Social History   Occupational History  . Occupation: Unemployed  Tobacco Use  . Smoking status: Current Every Day Smoker    Packs/day: 0.50    Years: 25.00    Pack years: 12.50    Types: Cigarettes  . Smokeless tobacco: Never Used  . Tobacco comment: Trying to stop smoking  Substance and Sexual Activity  . Alcohol use: No  . Drug use: No  . Sexual activity: Not Currently    Birth control/protection: Surgical    Comment: Partial Hyst

## 2019-11-24 ENCOUNTER — Telehealth: Payer: Self-pay | Admitting: Orthopaedic Surgery

## 2019-11-24 NOTE — Telephone Encounter (Signed)
Please read and let Debbie know.

## 2019-11-24 NOTE — Telephone Encounter (Signed)
Long discussion with [name] and her daughter re the surgery-never mentioned spending the night. This is an OP procedure. Probably should obtain medical clearance as well.

## 2019-11-24 NOTE — Telephone Encounter (Signed)
Patient has been cleared by her cardiologist. Patient wants to know why the surgery isn't being scheduled at the hospital.  Also she is asking if you have changed your mind about her staying overnight.  Please advise. She was under the impression she would stay overnight at [facility].  Do we need medical clearance too, or is cardiac sufficient?

## 2019-11-27 NOTE — Telephone Encounter (Signed)
Please see

## 2019-11-29 NOTE — Patient Instructions (Addendum)
DUE TO COVID-19 ONLY ONE VISITOR IS ALLOWED TO COME WITH YOU AND STAY IN THE WAITING ROOM ONLY DURING PRE OP AND PROCEDURE DAY OF SURGERY. TWO  VISITOR MAY VISIT WITH YOU AFTER SURGERY IN YOUR PRIVATE ROOM DURING VISITING HOURS ONLY!   10a-8p  YOU NEED TO HAVE A COVID 19 TEST ON__4-30-21_____ @__2 :15_____, THIS TEST MUST BE DONE BEFORE SURGERY, COME  [place], [place] , [id number].  ([place]) ONCE YOUR COVID TEST IS COMPLETED, PLEASE BEGIN THE QUARANTINE INSTRUCTIONS AS OUTLINED IN YOUR HANDOUT.                Tabatha Razzano  11/29/2019   Your procedure is scheduled on:   12-05-19   Report to [facility] Main  Entrance   Report to   Short stay  at      0530 AM     Call this number if you have problems the morning of surgery [phone]    Remember:    [place], NO [place].     Take these medicines the morning of surgery with A SIP OF WATER: levothyroxine, nexium, carvedilol, baclofen, atorvastatin, amlodipine, inhaler bring with you  DO NOT TAKE ANY  ORAL  DIABETIC MEDICATIONS DAY OF YOUR SURGERY   Check blood sugar before   [place] take 50% of normal dose day of surgery                             You may not have any metal on your body including hair pins and              piercings  Do not wear jewelry, make-up, lotions, powders or perfumes, deodorant             Do not wear nail polish on your fingernails.  Do not shave  48 hours prior to surgery.     Do not bring valuables to the hospital. [place].  Contacts, dentures or bridgework may not be worn into surgery.      Patients discharged the day of surgery will not be allowed to drive home. IF YOU ARE HAVING SURGERY AND GOING HOME THE SAME DAY, YOU MUST HAVE AN ADULT TO DRIVE YOU HOME AND BE WITH YOU FOR 24 HOURS. YOU MAY GO HOME BY TAXI OR UBER OR ORTHERWISE, BUT AN  ADULT MUST ACCOMPANY YOU HOME AND STAY WITH YOU FOR 24 HOURS.  Name and phone number of your driver:  Special Instructions: N/A              Please read over the following fact sheets you were given: _____________________________________________________________________             [facility] - Preparing for Surgery Before surgery, you can play an important role.  Because skin is not sterile, your skin needs to be as free of germs as possible.  You can reduce the number of germs on your skin by washing with CHG (chlorahexidine gluconate) soap before surgery.  CHG is an antiseptic cleaner which kills germs and bonds with the skin to continue killing germs even after washing. Please DO NOT use if you have an allergy to CHG or antibacterial soaps.  If your skin becomes reddened/irritated stop using the  CHG and inform your nurse when you arrive at Short Stay. Do not shave (including legs and underarms) for at least 48 hours prior to the first CHG shower.  You may shave your face/neck. Please follow these instructions carefully:  1.  Shower with CHG Soap the night before surgery and the  morning of Surgery.  2.  If you choose to wash your hair, wash your hair first as usual with your  normal  shampoo.  3.  After you shampoo, rinse your hair and body thoroughly to remove the  shampoo.                           4.  Use CHG as you would any other liquid soap.  You can apply chg directly  to the skin and wash                       Gently with a scrungie or clean washcloth.  5.  Apply the CHG Soap to your body ONLY FROM THE NECK DOWN.   Do not use on face/ open                           Wound or open sores. Avoid contact with eyes, ears mouth and genitals (private parts).                       Wash face,  Genitals (private parts) with your normal soap.             6.  Wash thoroughly, paying special attention to the area where your surgery  will be performed.  7.  Thoroughly rinse your body with warm  water from the neck down.  8.  DO NOT shower/wash with your normal soap after using and rinsing off  the CHG Soap.                9.  Pat yourself dry with a clean towel.            10.  Wear clean pajamas.            11.  Place clean sheets on your bed the night of your first shower and do not  sleep with pets. Day of Surgery : Do not apply any lotions/deodorants the morning of surgery.  Please wear clean clothes to the hospital/surgery center.  FAILURE TO FOLLOW THESE INSTRUCTIONS MAY RESULT IN THE CANCELLATION OF YOUR SURGERY PATIENT SIGNATURE_________________________________  NURSE SIGNATURE__________________________________  ________________________________________________________________________

## 2019-11-30 ENCOUNTER — Encounter (HOSPITAL_COMMUNITY)
Admission: RE | Admit: 2019-11-30 | Discharge: 2019-11-30 | Disposition: A | Discharge status: Home / Self Care | Payer: Medicaid Other | Source: Ambulatory Visit | Attending: Orthopaedic Surgery | Admitting: Orthopaedic Surgery

## 2019-11-30 ENCOUNTER — Encounter (HOSPITAL_COMMUNITY): Payer: Self-pay

## 2019-11-30 ENCOUNTER — Telehealth: Payer: Self-pay | Admitting: Cardiovascular Disease

## 2019-11-30 ENCOUNTER — Other Ambulatory Visit: Payer: Self-pay

## 2019-11-30 DIAGNOSIS — Z01818 Encounter for other preprocedural examination: Secondary | ICD-10-CM | POA: Insufficient documentation

## 2019-11-30 HISTORY — DX: Pneumonia, unspecified organism: J18.9

## 2019-11-30 HISTORY — DX: Gastro-esophageal reflux disease without esophagitis: K21.9

## 2019-11-30 HISTORY — DX: Depression, unspecified: F32.A

## 2019-11-30 HISTORY — DX: Acute myocardial infarction, unspecified: I21.9

## 2019-11-30 NOTE — Telephone Encounter (Signed)
   [facility] Pre-operative Risk Assessment    Request for surgical clearance:  1. What type of surgery is being performed? R Rotator Cuff Repair  2. When is this surgery scheduled? 12-05-19  3. What type of clearance is required (medical clearance vs. Pharmacy clearance to hold med vs. Both)? Pharmacy  Are there any medications that need to be held prior to surgery and how long? Plavix and Aspirin TBD by Cardiology 4. Practice name and name of physician performing surgery?  Dr. Joni Fears [facility]  5. What is your office phone number: [phone]   7.   What is your office fax number: [phone]  8.   Anesthesia type (None, local, MAC, general) ? General    Johnna Acosta 11/30/2019, 4:41 PM  _________________________________________________________________   (provider comments below)

## 2019-11-30 NOTE — H&P (Signed)
[name] is an 59 y.o. female.    Chief Complaint: Painful right shoulder  HPI: [name] is a 59 year old female who has been seen recently in our office for evaluation of her right shoulder pain.  She apparently had problems with her right shoulder about a year ago or so when at that time surgery was discussed.  She was scheduled but then that was canceled.  She returned to the office for reevaluation.  Is still felt that she has a rotator cuff involvement and the surgery is indicated.  She is now unable to raise her arm.  She did slip and fall over the winter on some ice landing onto her right shoulder.  She continues to hurt.  Now would like to proceed with shoulder surgery on the right.   Past Medical History:  Diagnosis Date  . Abdominal pain, right lower quadrant   . Abdominal wall pain   . Arterial bruit   . Arthritis   . Arthropathy   . Barrett's esophagus   . Callus   . Carotid stenosis, bilateral   . Chest pain in adult   . Chronic back pain   . Colon polyp   . Coronary artery disease, occlusive    stent x 2  . Depression   . Diabetic neuropathy ([place]) 06/02/2018   type 2  . Eosinophilic esophagitis   . Eosinophilic gastritis   . Fatigue   . Gastritis   . Gastroesophagitis   . GERD (gastroesophageal reflux disease)   . H. pylori infection   . H/O: hysterectomy   . Hypersomnia   . IBS (irritable bowel syndrome)   . Inflammatory bowel disease    colitis  . Inlet patch of esophagus   . Myocardial infarction ([place])   . Pneumonia   . Precordial pain   . PVD (peripheral vascular disease) ([place])    pt. denies  . Restless leg syndrome   . Right foot pain   . Shortness of breath on exertion   . Shoulder pain, right   . Ulcer of esophagus without bleeding     Past Surgical History:  Procedure Laterality Date  . AUGMENTATION MAMMAPLASTY Bilateral    breast lift  . BREAST EXCISIONAL BIOPSY Right   . CORONARY STENT INTERVENTION N/A 05/27/2017   Procedure: CORONARY  STENT INTERVENTION;  Surgeon: Lorretta Harp, MD;  Location: [place] [facility];  Service: Cardiovascular;  Laterality: N/A;  . EYE SURGERY     laser surgery for hole in eye  . FOOT SURGERY Left   . INTRAVASCULAR PRESSURE WIRE/FFR STUDY N/A 05/27/2017   Procedure: INTRAVASCULAR PRESSURE WIRE/FFR STUDY;  Surgeon: Lorretta Harp, MD;  Location: [place] [facility];  Service: Cardiovascular;  Laterality: N/A;  . LEFT HEART CATH AND CORONARY ANGIOGRAPHY N/A 05/27/2017   Procedure: LEFT HEART CATH AND CORONARY ANGIOGRAPHY;  Surgeon: Lorretta Harp, MD;  Location: [place] [facility];  Service: Cardiovascular;  Laterality: N/A;  . THYROIDECTOMY      Family History  Problem Relation Age of Onset  . Hyperlipidemia Mother   . Hypertension Mother   . Diabetes Mellitus II Mother   . Cancer Father   . Hypertension Sister   . Hypertension Brother   . Hypertension Brother   . Hypertension Brother   . Hypertension Sister   . Hypertension Sister   . Heart disease Maternal Aunt    Social History:  reports that she has been smoking cigarettes. She has a 12.50 pack-year smoking history.  She has never used smokeless tobacco. She reports that she does not drink alcohol or use drugs.  Allergies:  Allergies  Allergen Reactions  . Motrin [Ibuprofen] Hives  . Bactrim [Sulfamethoxazole-Trimethoprim] Rash  . Sulfa Antibiotics Hives and Rash    No medications prior to admission.   No current facility-administered medications for this encounter.   Current Outpatient Medications  Medication Sig Dispense Refill  . albuterol (PROAIR HFA) 108 (90 Base) MCG/ACT inhaler Inhale 2 puffs into the lungs every 6 (six) hours as needed for wheezing or shortness of breath.    Marland Kitchen amLODipine (NORVASC) 10 MG tablet Take 10 mg by mouth daily.    Marland Kitchen aspirin EC 81 MG tablet Take 81 mg by mouth every 3 (three) days.     Marland Kitchen atorvastatin (LIPITOR) 20 MG tablet Take 20 mg by mouth daily.  2  . baclofen (LIORESAL) 20 MG  tablet Take 20 mg by mouth 3 (three) times daily.  2  . carvedilol (COREG) 6.25 MG tablet Take 6.25 mg by mouth 2 (two) times daily.  0  . clopidogrel (PLAVIX) 75 MG tablet Take 1 tablet (75 mg total) by mouth daily. Please schedule annual appt with Dr. Allyson Sabal for refills. [phone]. 1st attempt. 30 tablet 0  . esomeprazole (NEXIUM) 40 MG capsule Take 40 mg by mouth daily.   12  . HYDROcodone-acetaminophen (NORCO) 7.5-325 MG tablet Take 1 tablet by mouth 4 (four) times daily as needed for moderate pain.   0  . levothyroxine (SYNTHROID) 112 MCG tablet Take 112 mcg by mouth daily.     Marland Kitchen lisinopril (PRINIVIL,ZESTRIL) 20 MG tablet Take 20 mg by mouth daily.     . metFORMIN (GLUCOPHAGE) 1000 MG tablet Take 1,000 mg by mouth 2 (two) times daily.  3  . Multiple Vitamin (MULTIVITAMIN WITH MINERALS) TABS tablet Take 1 tablet by mouth daily.    . nitroGLYCERIN (NITROSTAT) 0.4 MG SL tablet Place 1 tablet (0.4 mg total) under the tongue every 5 (five) minutes as needed for chest pain. 25 tablet 12  . traZODone (DESYREL) 100 MG tablet Take 100 mg by mouth at bedtime.   2  . TRESIBA FLEXTOUCH 200 UNIT/ML SOPN Inject 50 Units as directed daily.   2  . pregabalin (LYRICA) 25 MG capsule Take 1 capsule (25 mg total) by mouth 2 (two) times daily. (Patient not taking: Reported on 11/29/2019) 60 capsule 3     EXAM: MRI OF THE RIGHT SHOULDER WITHOUT CONTRAST  TECHNIQUE: Multiplanar, multisequence MR imaging of the shoulder was performed. No intravenous contrast was administered.  COMPARISON:  Right shoulder x-rays dated July 04, 2018.  FINDINGS: Rotator cuff: Full-thickness, partial width tear of the mid to distal supraspinatus tendon with 1.7 cm retraction. The tear measures approximately 8 mm in width. There is some bursal surface fraying of the distal infraspinatus tendon. The subscapularis and teres minor tendons are unremarkable.  Muscles: No muscle edema. Mild fatty infiltration of  the supraspinatus and infraspinatus muscles without significant atrophy.  Biceps long head:  Intact and normally positioned.  Acromioclavicular Joint: Mild arthropathy of the acromioclavicular joint. Type II acromion. No significant subacromial/subdeltoid bursal fluid.  Glenohumeral Joint: Mild diffuse cartilage thinning without focal defect. No joint effusion.  Labrum: Grossly intact, but evaluation is limited by lack of intraarticular fluid.  Bones: Reactive changes in the greater tuberosity. No acute fracture or dislocation. No focal bone lesion.  Other: None.  IMPRESSION: 1. Full-thickness, partial width tear of the mid to distal supraspinatus tendon  with 1.7 cm retraction. 2. Tendinosis and bursal surface fraying of the distal infraspinatus tendon. 3. Mild glenohumeral and acromioclavicular osteoarthritis.  No results found for this or any previous visit (from the past 48 hour(s)). No results found.  Review of Systems  There were no vitals taken for this visit. Physical Exam  Constitutional: She is oriented to person, place, and time. She appears well-developed and well-nourished.  HENT:  Head: Normocephalic and atraumatic.  Eyes: Pupils are equal, round, and reactive to light. Conjunctivae are normal.  Cardiovascular: Normal rate and regular rhythm.  Respiratory: Effort normal.  GI: Bowel sounds are normal.  Musculoskeletal:     Cervical back: Neck supple.  Neurological: She is alert and oriented to person, place, and time.  Skin: Skin is warm and dry.  Psychiatric: She has a normal mood and affect.  MS:  Appears to have some loss of overhead motion passively consistent with adhesive capsulitis.    Has positive impingement and empty can testing.  Appears to have good strength but pain with external rotation.   Assessment/Plan Impression:  Full-thickness, partial width tear of the mid to distal supraspinatus tendon with 1.7 cm retraction. Tendinosis and  bursal surface fraying of the distal infraspinatus tendon. Mild glenohumeral and acromioclavicular osteoarthritis.  Plan: At this time we feel that a arthroscopic subacromial decompression with distal clavicle excision as well as mini open rotator cuff repair is indicated.  Probably will also need to do a dermaspan band patch.  We will do this as a outpatient at [facility] because of her cardiac nature.   [name], PA-C 11/30/2019, 2:52 PM

## 2019-11-30 NOTE — Telephone Encounter (Signed)
Please call [name], PA to Dr. [name] with response to clearance. Number provided [phone])  is the PA's Personal cell phone

## 2019-11-30 NOTE — Progress Notes (Signed)
Spoke with Eunice Blase at Dr. Dellia Cloud office to inform her pt. Had no instructions on stopping plavix or 81 mg asa  at preop call. Eunice Blase stated she would call pt. Regarding Plavix instructions

## 2019-12-01 ENCOUNTER — Other Ambulatory Visit (HOSPITAL_COMMUNITY)
Admission: RE | Admit: 2019-12-01 | Discharge: 2019-12-01 | Disposition: A | Discharge status: Home / Self Care | Payer: Medicaid Other | Source: Ambulatory Visit | Attending: Orthopaedic Surgery | Admitting: Orthopaedic Surgery

## 2019-12-01 ENCOUNTER — Encounter (HOSPITAL_COMMUNITY)
Admission: RE | Admit: 2019-12-01 | Discharge: 2019-12-01 | Disposition: A | Discharge status: Home / Self Care | Payer: Medicaid Other | Source: Ambulatory Visit | Attending: Orthopaedic Surgery | Admitting: Orthopaedic Surgery

## 2019-12-01 DIAGNOSIS — Z01812 Encounter for preprocedural laboratory examination: Secondary | ICD-10-CM | POA: Diagnosis not present

## 2019-12-01 DIAGNOSIS — Z20822 Contact with and (suspected) exposure to covid-19: Secondary | ICD-10-CM | POA: Diagnosis not present

## 2019-12-01 LAB — URINALYSIS, ROUTINE W REFLEX MICROSCOPIC
Bilirubin Urine: NEGATIVE
Glucose, UA: NEGATIVE mg/dL
Hgb urine dipstick: NEGATIVE
Ketones, ur: NEGATIVE mg/dL
Leukocytes,Ua: NEGATIVE
Nitrite: NEGATIVE
Protein, ur: NEGATIVE mg/dL
Specific Gravity, Urine: 1.018 (ref 1.005–1.030)
pH: 6 (ref 5.0–8.0)

## 2019-12-01 LAB — COMPREHENSIVE METABOLIC PANEL
ALT: 15 U/L (ref 0–44)
AST: 15 U/L (ref 15–41)
Albumin: 3.6 g/dL (ref 3.5–5.0)
Alkaline Phosphatase: 123 U/L (ref 38–126)
Anion gap: 5 (ref 5–15)
BUN: 16 mg/dL (ref 6–20)
CO2: 33 mmol/L — ABNORMAL HIGH (ref 22–32)
Calcium: 9.2 mg/dL (ref 8.9–10.3)
Chloride: 104 mmol/L (ref 98–111)
Creatinine, Ser: 0.76 mg/dL (ref 0.44–1.00)
GFR calc Af Amer: >60 mL/min (ref >60)
GFR calc non Af Amer: >60 mL/min (ref >60)
Glucose, Bld: 87 mg/dL (ref 70–99)
Potassium: 3.9 mmol/L (ref 3.5–5.1)
Sodium: 142 mmol/L (ref 135–145)
Total Bilirubin: 0.5 mg/dL (ref 0.3–1.2)
Total Protein: 7.7 g/dL (ref 6.5–8.1)

## 2019-12-01 LAB — HEMOGLOBIN A1C
Hgb A1c MFr Bld: 6.9 % — ABNORMAL HIGH (ref 4.8–5.6)
Mean Plasma Glucose: 151.33 mg/dL

## 2019-12-01 LAB — CBC WITH DIFFERENTIAL/PLATELET
Abs Immature Granulocytes: 0.03 10*3/uL (ref 0.00–0.07)
Basophils Absolute: 0 10*3/uL (ref 0.0–0.1)
Basophils Relative: 1 %
Eosinophils Absolute: 0.1 10*3/uL (ref 0.0–0.5)
Eosinophils Relative: 1 %
HCT: 41.9 % (ref 36.0–46.0)
Hemoglobin: 13.4 g/dL (ref 12.0–15.0)
Immature Granulocytes: 0 %
Lymphocytes Relative: 25 %
Lymphs Abs: 1.9 10*3/uL (ref 0.7–4.0)
MCH: 28.3 pg (ref 26.0–34.0)
MCHC: 32 g/dL (ref 30.0–36.0)
MCV: 88.6 fL (ref 80.0–100.0)
Monocytes Absolute: 0.6 10*3/uL (ref 0.1–1.0)
Monocytes Relative: 7 %
Neutro Abs: 5.2 10*3/uL (ref 1.7–7.7)
Neutrophils Relative %: 66 %
Platelets: 236 10*3/uL (ref 150–400)
RBC: 4.73 MIL/uL (ref 3.87–5.11)
RDW: 15.2 % (ref 11.5–15.5)
WBC: 7.8 10*3/uL (ref 4.0–10.5)
nRBC: 0 % (ref 0.0–0.2)

## 2019-12-01 LAB — PROTIME-INR
INR: 1 (ref 0.8–1.2)
Prothrombin Time: 12.7 seconds (ref 11.4–15.2)

## 2019-12-01 LAB — GLUCOSE, CAPILLARY: Glucose-Capillary: 80 mg/dL (ref 70–99)

## 2019-12-01 LAB — APTT: aPTT: 27 seconds (ref 24–36)

## 2019-12-01 NOTE — Telephone Encounter (Signed)
   Primary Cardiologist: Dr. Allyson Sabal - as seen in 2018   Spoke with [name], PA with Dr. Cleophas Dunker. They received cardia clearance from Dr. Hanley Hays for upcoming surgery but wanted input on holding DAPT. Informed Arlys John that we are not affiliated with Dr. Hanley Hays and cannot comment on recommendations for holding DAPT.   I will remove from pre-op pool at this time.  Beatriz Stallion, PA-C 12/01/2019, 10:41 AM

## 2019-12-01 NOTE — Telephone Encounter (Signed)
   Primary Cardiologist: Dr. Allyson Sabal - last seen in 2018.  Chart reviewed as part of pre-operative protocol coverage.   Attempted to contact Arlys John to discuss clearance. Left a voicemail for him to call back.  On review of patient's chart, she has not been seen in our office since 2018. More recently appears to have followed with Dr. Hanley Hays.   Will await callback to determine if further preop assessment is needed.   Beatriz Stallion, PA-C 12/01/2019, 10:04 AM

## 2019-12-02 LAB — SARS CORONAVIRUS 2 (TAT 6-24 HRS): SARS Coronavirus 2: NEGATIVE

## 2019-12-04 ENCOUNTER — Other Ambulatory Visit: Payer: Self-pay

## 2019-12-04 ENCOUNTER — Encounter: Payer: Self-pay | Admitting: Physical Therapy

## 2019-12-04 ENCOUNTER — Ambulatory Visit: Payer: Medicaid Other | Attending: Orthopaedic Surgery | Admitting: Physical Therapy

## 2019-12-04 DIAGNOSIS — G8929 Other chronic pain: Secondary | ICD-10-CM | POA: Diagnosis present

## 2019-12-04 DIAGNOSIS — M6281 Muscle weakness (generalized): Secondary | ICD-10-CM | POA: Insufficient documentation

## 2019-12-04 DIAGNOSIS — M25611 Stiffness of right shoulder, not elsewhere classified: Secondary | ICD-10-CM | POA: Insufficient documentation

## 2019-12-04 DIAGNOSIS — M25511 Pain in right shoulder: Secondary | ICD-10-CM | POA: Insufficient documentation

## 2019-12-04 DIAGNOSIS — Z9889 Other specified postprocedural states: Secondary | ICD-10-CM | POA: Diagnosis present

## 2019-12-04 NOTE — Progress Notes (Signed)
Anesthesia Chart Review   Case: [id number] Date/Time: 12/05/19 0715   Procedure: SHOULDER ARTHROSCOPY, SUBACROMIAL DECOMPRESSION AND DISTAL CLAVICLE RESECTION, MINI OPEN ROTATOR CUFF REPAIR (POSSIBLE DERMASPAN PATCH) (Right Shoulder)   Anesthesia type: Choice   Pre-op diagnosis: right shoulder impingement syndrome, acromioclavicular arthritis   Location: [facility]OM 07 / [facility]   Surgeons: Valeria Batman, MD      DISCUSSION:59 y.o. current every day smoker (12.5 pack years) with h/o GERD, DM II, CAD (DES to RCA 04/05/2017, DES 05/27/2017), right shoulder impingement scheduled for above procedure 12/05/2019 with Dr. [name].    Low risk stress test 04/17/2019.  Pt last seen by cardiology 05/04/2019.  Stable at this visit.  Per OV note no further cardiac testing needed prior to shoulder surgery.  Clearance received from Dr. Hanley Hays which states pt is cleared from surgery from cardiac standpoint, on chart.    Per cardiology note pt only on 81mg  ASA.  Per med rec pt on DAPT, Plavix and ASA.  Spoke with pt who reports last dose of Plavix 11/30/2019.    Anticipate pt can proceed with planned procedure barring acute status change.   VS: Ht 5\' 2"  (1.575 m)   Wt 69.9 kg   BMI 28.17 kg/m   PROVIDERS: 12/02/2019, MD is PCP   , MD is Cardiologist  LABS: Labs reviewed: Acceptable for surgery. (all labs ordered are listed, but only abnormal results are displayed)  Labs Reviewed - No data to display   IMAGES:   EKG: 04/17/2019 Rate 68 bpm  Sinus rhythm with fusion complexes Cannot rule out anterior infarct, age undetermined   CV: Echo 05/01/2019 Conclusion 1. Normal study: normal cardiac chamber sizes and function; normal valve anatomy and function; no pericardial effusion or intracardiac mass.  No intarcardiac shunt(s) by 2-Dimensional and color flow imaging.  Normal thoracic aorta and aortic arch.   Myocardial Perfusion 04/17/2019 IMPRESSION: 1. No definite reversible  ischemia or infarction. Probable breast attenuation of the anterior wall and apical thinning without associated wall motion abnormalities.  2. Normal left ventricular wall motion.  3. Left ventricular ejection fraction 58 %.  Previously 56%  4. Non invasive risk stratification*: Low  Cardiac Cath 05/27/2017  Prox RCA to Mid RCA lesion, 0 %stenosed.  Prox RCA lesion, 60 %stenosed.  Mid LAD lesion, 50 %stenosed.  Ost 1st Mrg to 1st Mrg lesion, 90 %stenosed.  Post intervention, there is a 0% residual stenosis.  A stent was successfully placed.  There is mild to moderate left ventricular systolic dysfunction.  LV end diastolic pressure is normal.  The left ventricular ejection fraction is 45-50% by visual estimate.  Echo 04/06/2017 Conclusions Ejection fraction is visually estimated at 60.  Ejection fraction is measured at 63.  Normal appearance and function of the mitral valve with trace physiologic regurgitation Right ventricular systolic pressure is consistent with mild pulmonary hypertension.  Estimated peak RVSP is 37 mmHg.  There is trivial tricuspid regurgitation Past Medical History:  Diagnosis Date  . Abdominal pain, right lower quadrant   . Abdominal wall pain   . Arterial bruit   . Arthritis   . Arthropathy   . Barrett's esophagus   . Callus   . Carotid stenosis, bilateral   . Chest pain in adult   . Chronic back pain   . Colon polyp   . Coronary artery disease, occlusive    stent x 2  . Depression   . Diabetic neuropathy (HCC) 06/02/2018   type 2  .  Eosinophilic esophagitis   . Eosinophilic gastritis   . Fatigue   . Gastritis   . Gastroesophagitis   . GERD (gastroesophageal reflux disease)   . H. pylori infection   . H/O: hysterectomy   . Hypersomnia   . IBS (irritable bowel syndrome)   . Inflammatory bowel disease    colitis  . Inlet patch of esophagus   . Myocardial infarction ([place])   . Pneumonia   . Precordial pain   . PVD (peripheral  vascular disease) ([place])    pt. denies  . Restless leg syndrome   . Right foot pain   . Shortness of breath on exertion   . Shoulder pain, right   . Ulcer of esophagus without bleeding     Past Surgical History:  Procedure Laterality Date  . AUGMENTATION MAMMAPLASTY Bilateral    breast lift  . BREAST EXCISIONAL BIOPSY Right   . CORONARY STENT INTERVENTION N/A 05/27/2017   Procedure: CORONARY STENT INTERVENTION;  Surgeon: Lorretta Harp, MD;  Location: [place] [facility];  Service: Cardiovascular;  Laterality: N/A;  . EYE SURGERY     laser surgery for hole in eye  . FOOT SURGERY Left   . INTRAVASCULAR PRESSURE WIRE/FFR STUDY N/A 05/27/2017   Procedure: INTRAVASCULAR PRESSURE WIRE/FFR STUDY;  Surgeon: Lorretta Harp, MD;  Location: [place] [facility];  Service: Cardiovascular;  Laterality: N/A;  . LEFT HEART CATH AND CORONARY ANGIOGRAPHY N/A 05/27/2017   Procedure: LEFT HEART CATH AND CORONARY ANGIOGRAPHY;  Surgeon: Lorretta Harp, MD;  Location: [place] [facility];  Service: Cardiovascular;  Laterality: N/A;  . THYROIDECTOMY      MEDICATIONS: . albuterol (PROAIR HFA) 108 (90 Base) MCG/ACT inhaler  . amLODipine (NORVASC) 10 MG tablet  . aspirin EC 81 MG tablet  . atorvastatin (LIPITOR) 20 MG tablet  . baclofen (LIORESAL) 20 MG tablet  . carvedilol (COREG) 6.25 MG tablet  . clopidogrel (PLAVIX) 75 MG tablet  . esomeprazole (NEXIUM) 40 MG capsule  . HYDROcodone-acetaminophen (NORCO) 7.5-325 MG tablet  . levothyroxine (SYNTHROID) 112 MCG tablet  . lisinopril (PRINIVIL,ZESTRIL) 20 MG tablet  . metFORMIN (GLUCOPHAGE) 1000 MG tablet  . Multiple Vitamin (MULTIVITAMIN WITH MINERALS) TABS tablet  . nitroGLYCERIN (NITROSTAT) 0.4 MG SL tablet  . pregabalin (LYRICA) 25 MG capsule  . traZODone (DESYREL) 100 MG tablet  . TRESIBA FLEXTOUCH 200 UNIT/ML SOPN   No current facility-administered medications for this encounter.    Maia Plan [facility] Pre-Surgical Testing [phone] 12/04/19  10:55 AM

## 2019-12-04 NOTE — Anesthesia Preprocedure Evaluation (Addendum)
Anesthesia Evaluation  Patient identified by MRN, date of birth, ID band Patient awake    Reviewed: Allergy & Precautions, NPO status , Patient's Chart, lab work & pertinent test results  History of Anesthesia Complications Negative for: history of anesthetic complications  Airway Mallampati: II  TM Distance: >3 FB Neck ROM: Full    Dental  (+) Edentulous Upper, Edentulous Lower   Pulmonary sleep apnea (noncompliant) , Current Smoker and Patient abstained from smoking.,    Pulmonary exam normal        Cardiovascular hypertension, Pt. on medications and Pt. on home beta blockers (-) angina+ CAD, + Past MI, + Cardiac Stents and + Peripheral Vascular Disease  Normal cardiovascular exam   '18 Cath - Prox RCA to Mid RCA lesion, 0 %stenosed. Prox RCA lesion, 60 %stenosed. Mid LAD lesion, 50 %stenosed. Ost 1st Mrg to 1st Mrg lesion, 90 %stenosed. Post intervention, there is a 0% residual stenosis. A stent was successfully placed. There is mild to moderate left ventricular systolic dysfunction. LV end diastolic pressure is normal. The left ventricular ejection fraction is 45-50% by visual estimate.   Neuro/Psych PSYCHIATRIC DISORDERS Depression  Neuromuscular disease (RLS)    GI/Hepatic Neg liver ROS, PUD, GERD  Medicated and Controlled, IBS    Endo/Other  diabetes, Type 2, Oral Hypoglycemic Agents, Insulin Dependent  Renal/GU negative Renal ROS     Musculoskeletal  (+) Arthritis ,  Chronic back pain    Abdominal   Peds  Hematology  On plavix    Anesthesia Other Findings Covid neg 4/30   Reproductive/Obstetrics                            Anesthesia Physical Anesthesia Plan  ASA: III  Anesthesia Plan: General   Post-op Pain Management:  Regional for Post-op pain   Induction: Intravenous  PONV Risk Score and Plan: 3 and Treatment may vary due to age or medical condition,  Ondansetron, Dexamethasone and Midazolam  Airway Management Planned: Oral ETT  Additional Equipment: None  Intra-op Plan:   Post-operative Plan: Extubation in OR  Informed Consent: I have reviewed the patients History and Physical, chart, labs and discussed the procedure including the risks, benefits and alternatives for the proposed anesthesia with the patient or authorized representative who has indicated his/her understanding and acceptance.     Dental advisory given  Plan Discussed with: CRNA and Anesthesiologist  Anesthesia Plan Comments:        Anesthesia Quick Evaluation

## 2019-12-04 NOTE — Therapy (Signed)
[facility] [address] [place], [place], [address] Phone: [phone]   Fax:  [phone]  Physical Therapy Evaluation  Patient Details  Name: [name] MRN: [id number] Date of Birth: 1961/05/26 Referring Provider (PT): Valeria Batman, MD   Encounter Date: 12/04/2019  PT End of Session - 12/04/19 1012    Visit Number  1    Number of Visits  1   patient is having surgery tomorrow and will need new referral and re-evaluation   Date for PT Re-Evaluation  --   Patient will need re-evaluation and establish new POC at next visit   Authorization Type  MCD    PT Start Time  0845   patient arrived late   PT Stop Time  0915    PT Time Calculation (min)  30 min    Activity Tolerance  Patient limited by pain    Behavior During Therapy  [facility] for tasks assessed/performed       Past Medical History:  Diagnosis Date  . Abdominal pain, right lower quadrant   . Abdominal wall pain   . Arterial bruit   . Arthritis   . Arthropathy   . Barrett's esophagus   . Callus   . Carotid stenosis, bilateral   . Chest pain in adult   . Chronic back pain   . Colon polyp   . Coronary artery disease, occlusive    stent x 2  . Depression   . Diabetic neuropathy (HCC) 06/02/2018   type 2  . Eosinophilic esophagitis   . Eosinophilic gastritis   . Fatigue   . Gastritis   . Gastroesophagitis   . GERD (gastroesophageal reflux disease)   . H. pylori infection   . H/O: hysterectomy   . Hypersomnia   . IBS (irritable bowel syndrome)   . Inflammatory bowel disease    colitis  . Inlet patch of esophagus   . Myocardial infarction (HCC)   . Pneumonia   . Precordial pain   . PVD (peripheral vascular disease) (HCC)    pt. denies  . Restless leg syndrome   . Right foot pain   . Shortness of breath on exertion   . Shoulder pain, right   . Ulcer of esophagus without bleeding     Past Surgical History:  Procedure Laterality Date  .  AUGMENTATION MAMMAPLASTY Bilateral    breast lift  . BREAST EXCISIONAL BIOPSY Right   . CORONARY STENT INTERVENTION N/A 05/27/2017   Procedure: CORONARY STENT INTERVENTION;  Surgeon: Runell Gess, MD;  [facility];  Service: Cardiovascular;  Laterality: N/A;  . EYE SURGERY     laser surgery for hole in eye  . FOOT SURGERY Left   . INTRAVASCULAR PRESSURE WIRE/FFR STUDY N/A 05/27/2017   Procedure: INTRAVASCULAR PRESSURE WIRE/FFR STUDY;  Surgeon: Runell Gess, MD;  Location: [facility];  Service: Cardiovascular;  Laterality: N/A;  . LEFT HEART CATH AND CORONARY ANGIOGRAPHY N/A 05/27/2017   Procedure: LEFT HEART CATH AND CORONARY ANGIOGRAPHY;  Surgeon: Runell Gess, MD;  Location: [facility];  Service: Cardiovascular;  Laterality: N/A;  . THYROIDECTOMY      There were no vitals filed for this visit.   Subjective Assessment - 12/04/19 0849    Subjective  Patient reports she has a torn rotator cuff and can't hardly lift her arm and it aches constantly. She unable to sleep on her right side due to shoulder pain. She is scheduled  for shoulder surgery tomorrow but the surgeon wanted her to be seen to work on the shoulder.    Limitations  Lifting;House hold activities    Diagnostic tests  X-ray, MRI    Patient Stated Goals  Get rid of pain and move shoulder better    Currently in Pain?  Yes    Pain Score  10-Worst pain ever    Pain Location  Shoulder    Pain Orientation  Right    Pain Descriptors / Indicators  Aching;Sharp    Pain Type  Chronic pain    Pain Onset  More than a month ago    Pain Frequency  Constant    Aggravating Factors   Raising the shoulder, lying on the right shoulder, pain is constant    Pain Relieving Factors  Medication    Effect of Pain on Daily Activities  Patient is unable to use right arm with activities         [facility] PT Assessment - 12/04/19 0001      Assessment   Medical Diagnosis  Chronic right shoulder pain     Referring Provider (PT)  Garald Balding, MD    Hand Dominance  Right    Next MD Visit  12/12/2019    Prior Therapy  Yes - Cardiac rehab      Precautions   Precautions  None    Precaution Comments  Patient scheduled for surgery tomorrow (12/05/2019) so will likely have rotator cuff repair precautions      Restrictions   Weight Bearing Restrictions  No      Balance Screen   Has the patient fallen in the past 6 months  Yes    How many times?  1 - December 2020    Has the patient had a decrease in activity level because of a fear of falling?   No    Is the patient reluctant to leave their home because of a fear of falling?   No      Home Film/video editor residence    Living Arrangements  Children   daughter   Available Help at Discharge  Family    Type of [place] to enter    Entrance Stairs-Number of Steps  1 flight of stairs    Home Layout  Two level    Alternate Level Stairs-Number of Steps  1 flight up to room      Prior Function   Level of Independence  Independent with basic ADLs    Vocation  On disability    Leisure  Watching TV      Cognition   Overall Cognitive Status  Within Functional Limits for tasks assessed      Observation/Other Assessments   Observations  Patient appears in no apparent distress    Focus on Therapeutic Outcomes (FOTO)   NA - MCD      Sensation   Light Touch  Appears Intact      Posture/Postural Control   Posture Comments  Patient exhibits rounded shoulder and forward head posture      ROM / Strength   AROM / PROM / Strength  AROM;PROM;Strength      AROM   Overall AROM Comments  Patient reports increased pain with all right shoulder movement    AROM Assessment Site  Shoulder    Right/Left Shoulder  Right;Left    Right Shoulder Flexion  105 Degrees  Right Shoulder ABduction  70 Degrees    Right Shoulder Internal Rotation  --   PSIS   Right Shoulder External Rotation  --    Occiput   Left Shoulder Flexion  160 Degrees    Left Shoulder ABduction  170 Degrees    Left Shoulder Internal Rotation  --   T10   Left Shoulder External Rotation  --   T2     PROM   Overall PROM Comments  Patient reports increased pain with all right shoulder movement    PROM Assessment Site  Shoulder    Right/Left Shoulder  Right    Right Shoulder Flexion  110 Degrees    Right Shoulder ABduction  90 Degrees    Right Shoulder Internal Rotation  25 Degrees    Right Shoulder External Rotation  40 Degrees      Strength   Overall Strength Comments  Patient reports increased pain with all right shoulder testing    Strength Assessment Site  Shoulder    Right/Left Shoulder  Right;Left    Right Shoulder Flexion  3-/5    Right Shoulder Extension  4-/5    Right Shoulder ABduction  3-/5    Right Shoulder Internal Rotation  4/5    Right Shoulder External Rotation  3-/5    Right Shoulder Horizontal ABduction  4-/5    Left Shoulder Flexion  4+/5    Left Shoulder Extension  4/5    Left Shoulder ABduction  4+/5    Left Shoulder Internal Rotation  4+/5    Left Shoulder External Rotation  4/5    Left Shoulder Horizontal ABduction  4/5      Palpation   Palpation comment  TTP globally over right shoulder region, non-specific to anatomical structures      Special Tests   Other special tests  Not tested      Transfers   Transfers  Independent with all Transfers      Ambulation/Gait   Ambulation/Gait  No                Objective measurements completed on examination: See above findings.      [facility] Adult PT Treatment/Exercise - 12/04/19 0001      Self-Care   Self-Care  Heat/Ice Application;Other Self-Care Comments    Heat/Ice Application  Use of ice consistently following her surgery    Other Self-Care Comments   Post-op precautions and expectations following rotator cuff repair surgery             PT Education - 12/04/19 0852    Education Details  Exam  findings, POC, re-evaluation following shoulder surgery    Person(s) Educated  Patient    Methods  Explanation    Comprehension  Verbalized understanding       PT Short Term Goals - 12/04/19 1023      PT SHORT TERM GOAL #1   Title  Patient will understand post-op precautions for rotator cuff repair surgery.    Status  Achieved                Plan - 12/04/19 1014    Clinical Impression Statement  Patient presents to PT with report of chronic right shoulder pain and is scheduled for rotator cuff repair surgery tomorrow (12/05/2019). She does exhibit significant range of motion and strength limitations with high levels of pain that limit her ability to functionally use the right arm. Most of today's session was spend explaining to patient post-op expectations and using  modalities for pain control. She was instructed that she will need to schedule a new re-evaluation following surgery due to that being a change of status and will be following a new protocol. She would benefit from continued skilled PT following her scheduled surgery to progress her motion and strength so she can use her right arm for overhead tasks and improve grooming and dressing ability.    Personal Factors and Comorbidities  Fitness;Past/Current Experience;Social Background;Time since onset of injury/illness/exacerbation;Finances;Transportation    Examination-Activity Limitations  Bathing;Carry;Reach Overhead;Sleep;Lift;Hygiene/Grooming;Dressing    Examination-Participation Restrictions  Meal Prep;Cleaning;Community Activity;Driving;Shop;Laundry;Yard Work    Stability/Clinical Decision Making  Stable/Uncomplicated    Clinical Decision Making  Low    Rehab Potential  Good    PT Frequency  One time visit   patient will need a new referral and re-evaluation following her surgery scheduled for tomorrow (12/05/2019)   PT Duration  Other (comment)   NA   PT Treatment/Interventions  ADLs/Self Care Home  Management;Cryotherapy;Electrical Stimulation;Moist Heat;Neuromuscular re-education;Therapeutic exercise;Therapeutic activities;Functional mobility training;Patient/family education;Manual techniques;Dry needling;Passive range of motion;Scar mobilization;Taping;Vasopneumatic Device;Joint Manipulations;Spinal Manipulations    PT Next Visit Plan  Re-evaluation will be needed following surgery    PT Home Exercise Plan  None provided this visit, patient educated on post-op precautions for rotator cuff repair surgery, using modalities for pain control    Consulted and Agree with Plan of Care  Patient       Patient will benefit from skilled therapeutic intervention in order to improve the following deficits and impairments:  Decreased range of motion, Impaired UE functional use, Decreased activity tolerance, Pain, Impaired flexibility, Hypomobility, Decreased strength, Postural dysfunction  Visit Diagnosis: Chronic right shoulder pain  Muscle weakness (generalized)  Stiffness of right shoulder, not elsewhere classified     Problem List Patient Active Problem List   Diagnosis Date Noted  . Diabetic neuropathy (HCC) 06/02/2018  . H/O: hysterectomy   . Abdominal wall pain   . Shoulder pain, right   . Callus   . Arthropathy   . Fatigue   . Arterial bruit   . Barrett's esophagus   . Ulcer of esophagus without bleeding   . Right foot pain   . Inflammatory bowel disease   . Precordial pain   . Colon polyp   . Chest pain in adult   . Shortness of breath on exertion   . Restless leg syndrome   . Coronary artery disease, occlusive   . Eosinophilic esophagitis   . Gastroesophagitis   . Gastritis   . H. pylori infection   . Inlet patch of esophagus   . PVD (peripheral vascular disease) (HCC)   . Eosinophilic gastritis   . Chronic back pain   . Abdominal pain, right lower quadrant   . Hypersomnia   . Carotid stenosis, bilateral   . CAD (coronary artery disease) 05/27/2017  . Unstable  angina (HCC)   . Coronary artery disease 05/25/2017  . Essential hypertension 05/25/2017  . Hyperlipidemia 05/25/2017    Rosana Hoes, PT, DPT, LAT, ATC 12/04/19  10:30 AM Phone: [phone] Fax: [phone]   [facility] [address] [place], [place], [address] Phone: [phone]   Fax:  [phone]  Name: [name] MRN: [id number] Date of Birth: 1961/04/16

## 2019-12-05 ENCOUNTER — Other Ambulatory Visit: Payer: Self-pay

## 2019-12-05 ENCOUNTER — Encounter (HOSPITAL_COMMUNITY)
Admission: RE | Disposition: A | Discharge status: Home / Self Care | Payer: Self-pay | Source: Home / Self Care | Attending: Orthopaedic Surgery

## 2019-12-05 ENCOUNTER — Encounter (HOSPITAL_COMMUNITY): Payer: Self-pay | Admitting: Orthopaedic Surgery

## 2019-12-05 ENCOUNTER — Ambulatory Visit (HOSPITAL_COMMUNITY): Payer: Medicaid Other | Admitting: Anesthesiology

## 2019-12-05 ENCOUNTER — Observation Stay (HOSPITAL_COMMUNITY)
Admission: RE | Admit: 2019-12-05 | Discharge: 2019-12-06 | Disposition: A | Discharge status: Home / Self Care | Payer: Medicaid Other | Attending: Orthopaedic Surgery | Admitting: Orthopaedic Surgery

## 2019-12-05 ENCOUNTER — Ambulatory Visit (HOSPITAL_COMMUNITY): Payer: Medicaid Other | Admitting: Physician Assistant

## 2019-12-05 DIAGNOSIS — G4700 Insomnia, unspecified: Secondary | ICD-10-CM | POA: Diagnosis not present

## 2019-12-05 DIAGNOSIS — I2510 Atherosclerotic heart disease of native coronary artery without angina pectoris: Secondary | ICD-10-CM | POA: Diagnosis not present

## 2019-12-05 DIAGNOSIS — Z7902 Long term (current) use of antithrombotics/antiplatelets: Secondary | ICD-10-CM | POA: Diagnosis not present

## 2019-12-05 DIAGNOSIS — I10 Essential (primary) hypertension: Secondary | ICD-10-CM | POA: Insufficient documentation

## 2019-12-05 DIAGNOSIS — K219 Gastro-esophageal reflux disease without esophagitis: Secondary | ICD-10-CM | POA: Insufficient documentation

## 2019-12-05 DIAGNOSIS — Z7982 Long term (current) use of aspirin: Secondary | ICD-10-CM | POA: Diagnosis not present

## 2019-12-05 DIAGNOSIS — M7541 Impingement syndrome of right shoulder: Principal | ICD-10-CM | POA: Insufficient documentation

## 2019-12-05 DIAGNOSIS — E669 Obesity, unspecified: Secondary | ICD-10-CM

## 2019-12-05 DIAGNOSIS — F329 Major depressive disorder, single episode, unspecified: Secondary | ICD-10-CM | POA: Diagnosis not present

## 2019-12-05 DIAGNOSIS — Z955 Presence of coronary angioplasty implant and graft: Secondary | ICD-10-CM | POA: Diagnosis not present

## 2019-12-05 DIAGNOSIS — R0602 Shortness of breath: Secondary | ICD-10-CM

## 2019-12-05 DIAGNOSIS — E1169 Type 2 diabetes mellitus with other specified complication: Secondary | ICD-10-CM

## 2019-12-05 DIAGNOSIS — I252 Old myocardial infarction: Secondary | ICD-10-CM | POA: Insufficient documentation

## 2019-12-05 DIAGNOSIS — Z79890 Hormone replacement therapy (postmenopausal): Secondary | ICD-10-CM | POA: Insufficient documentation

## 2019-12-05 DIAGNOSIS — G4730 Sleep apnea, unspecified: Secondary | ICD-10-CM | POA: Diagnosis not present

## 2019-12-05 DIAGNOSIS — F17210 Nicotine dependence, cigarettes, uncomplicated: Secondary | ICD-10-CM | POA: Insufficient documentation

## 2019-12-05 DIAGNOSIS — I739 Peripheral vascular disease, unspecified: Secondary | ICD-10-CM | POA: Diagnosis present

## 2019-12-05 DIAGNOSIS — M75121 Complete rotator cuff tear or rupture of right shoulder, not specified as traumatic: Secondary | ICD-10-CM | POA: Insufficient documentation

## 2019-12-05 DIAGNOSIS — M75122 Complete rotator cuff tear or rupture of left shoulder, not specified as traumatic: Secondary | ICD-10-CM | POA: Diagnosis not present

## 2019-12-05 DIAGNOSIS — M67813 Other specified disorders of tendon, right shoulder: Secondary | ICD-10-CM | POA: Diagnosis not present

## 2019-12-05 DIAGNOSIS — G2581 Restless legs syndrome: Secondary | ICD-10-CM | POA: Diagnosis not present

## 2019-12-05 DIAGNOSIS — M19011 Primary osteoarthritis, right shoulder: Secondary | ICD-10-CM | POA: Insufficient documentation

## 2019-12-05 DIAGNOSIS — I6523 Occlusion and stenosis of bilateral carotid arteries: Secondary | ICD-10-CM | POA: Diagnosis not present

## 2019-12-05 DIAGNOSIS — E785 Hyperlipidemia, unspecified: Secondary | ICD-10-CM | POA: Insufficient documentation

## 2019-12-05 DIAGNOSIS — Z79899 Other long term (current) drug therapy: Secondary | ICD-10-CM | POA: Diagnosis not present

## 2019-12-05 DIAGNOSIS — E1140 Type 2 diabetes mellitus with diabetic neuropathy, unspecified: Secondary | ICD-10-CM | POA: Diagnosis not present

## 2019-12-05 DIAGNOSIS — E7800 Pure hypercholesterolemia, unspecified: Secondary | ICD-10-CM | POA: Diagnosis not present

## 2019-12-05 DIAGNOSIS — E039 Hypothyroidism, unspecified: Secondary | ICD-10-CM | POA: Insufficient documentation

## 2019-12-05 DIAGNOSIS — E1151 Type 2 diabetes mellitus with diabetic peripheral angiopathy without gangrene: Secondary | ICD-10-CM | POA: Insufficient documentation

## 2019-12-05 DIAGNOSIS — R0902 Hypoxemia: Secondary | ICD-10-CM | POA: Diagnosis not present

## 2019-12-05 DIAGNOSIS — Z794 Long term (current) use of insulin: Secondary | ICD-10-CM | POA: Insufficient documentation

## 2019-12-05 HISTORY — PX: SHOULDER ARTHROSCOPY WITH DISTAL CLAVICLE RESECTION: SHX5675

## 2019-12-05 LAB — COMPREHENSIVE METABOLIC PANEL
ALT: 22 U/L (ref 0–44)
AST: 24 U/L (ref 15–41)
Albumin: 4 g/dL (ref 3.5–5.0)
Alkaline Phosphatase: 135 U/L — ABNORMAL HIGH (ref 38–126)
Anion gap: 8 (ref 5–15)
BUN: 15 mg/dL (ref 6–20)
CO2: 29 mmol/L (ref 22–32)
Calcium: 9.4 mg/dL (ref 8.9–10.3)
Chloride: 102 mmol/L (ref 98–111)
Creatinine, Ser: 0.76 mg/dL (ref 0.44–1.00)
GFR calc Af Amer: >60 mL/min (ref >60)
GFR calc non Af Amer: >60 mL/min (ref >60)
Glucose, Bld: 302 mg/dL — ABNORMAL HIGH (ref 70–99)
Potassium: 4.3 mmol/L (ref 3.5–5.1)
Sodium: 139 mmol/L (ref 135–145)
Total Bilirubin: 0.6 mg/dL (ref 0.3–1.2)
Total Protein: 8.5 g/dL — ABNORMAL HIGH (ref 6.5–8.1)

## 2019-12-05 LAB — GLUCOSE, CAPILLARY
Glucose-Capillary: 86 mg/dL (ref 70–99)
Glucose-Capillary: 106 mg/dL — ABNORMAL HIGH (ref 70–99)
Glucose-Capillary: 278 mg/dL — ABNORMAL HIGH (ref 70–99)
Glucose-Capillary: 366 mg/dL — ABNORMAL HIGH (ref 70–99)

## 2019-12-05 LAB — CBC
HCT: 44.1 % (ref 36.0–46.0)
Hemoglobin: 14.2 g/dL (ref 12.0–15.0)
MCH: 29.1 pg (ref 26.0–34.0)
MCHC: 32.2 g/dL (ref 30.0–36.0)
MCV: 90.4 fL (ref 80.0–100.0)
Platelets: 219 10*3/uL (ref 150–400)
RBC: 4.88 MIL/uL (ref 3.87–5.11)
RDW: 15.3 % (ref 11.5–15.5)
WBC: 12.9 10*3/uL — ABNORMAL HIGH (ref 4.0–10.5)
nRBC: 0 % (ref 0.0–0.2)

## 2019-12-05 LAB — TSH: TSH: 0.284 u[IU]/mL — ABNORMAL LOW (ref 0.350–4.500)

## 2019-12-05 LAB — BRAIN NATRIURETIC PEPTIDE: B Natriuretic Peptide: 138.3 pg/mL — ABNORMAL HIGH (ref 0.0–100.0)

## 2019-12-05 LAB — HIV ANTIBODY (ROUTINE TESTING W REFLEX): HIV Screen 4th Generation wRfx: NONREACTIVE

## 2019-12-05 SURGERY — SHOULDER ARTHROSCOPY WITH DISTAL CLAVICLE RESECTION
Anesthesia: General | Site: Shoulder | Laterality: Right

## 2019-12-05 MED ORDER — SUGAMMADEX SODIUM 200 MG/2ML IV SOLN
INTRAVENOUS | Status: DC | PRN
Start: 2019-12-05 — End: 2019-12-05
  Administered 2019-12-05: 200 mg via INTRAVENOUS

## 2019-12-05 MED ORDER — DEXAMETHASONE SODIUM PHOSPHATE 10 MG/ML IJ SOLN
INTRAMUSCULAR | Status: AC
  Filled 2019-12-05: qty 1

## 2019-12-05 MED ORDER — PROPOFOL 10 MG/ML IV BOLUS
INTRAVENOUS | Status: AC
  Filled 2019-12-05: qty 20

## 2019-12-05 MED ORDER — FENTANYL CITRATE (PF) 100 MCG/2ML IJ SOLN
INTRAMUSCULAR | Status: AC
  Filled 2019-12-05: qty 2

## 2019-12-05 MED ORDER — OXYCODONE HCL 5 MG PO TABS
5.0000 mg | ORAL_TABLET | ORAL | Status: DC | PRN
  Administered 2019-12-06: 5 mg via ORAL
  Administered 2019-12-06 (×3): 10 mg via ORAL
  Filled 2019-12-05 (×3): qty 2
  Filled 2019-12-05: qty 1

## 2019-12-05 MED ORDER — BUPIVACAINE HCL 0.5 % IJ SOLN
INTRAMUSCULAR | Status: DC | PRN
  Administered 2019-12-05: 15 mL

## 2019-12-05 MED ORDER — PHENOL 1.4 % MT LIQD
1.0000 | OROMUCOSAL | Status: DC | PRN
  Filled 2019-12-05: qty 177

## 2019-12-05 MED ORDER — LISINOPRIL 20 MG PO TABS
20.0000 mg | ORAL_TABLET | Freq: Every day | ORAL | Status: DC
  Administered 2019-12-05 – 2019-12-06 (×2): 20 mg via ORAL
  Filled 2019-12-05 (×2): qty 1

## 2019-12-05 MED ORDER — PANTOPRAZOLE SODIUM 40 MG PO TBEC
40.0000 mg | DELAYED_RELEASE_TABLET | Freq: Every day | ORAL | Status: DC
  Administered 2019-12-05 – 2019-12-06 (×2): 40 mg via ORAL
  Filled 2019-12-05 (×2): qty 1

## 2019-12-05 MED ORDER — SODIUM CHLORIDE 0.9 % IV SOLN: 75.0000 mL/h | INTRAVENOUS | Status: DC

## 2019-12-05 MED ORDER — PHENYLEPHRINE HCL-NACL 10-0.9 MG/250ML-% IV SOLN
INTRAVENOUS | Status: DC | PRN
  Administered 2019-12-05: 50 ug/min via INTRAVENOUS

## 2019-12-05 MED ORDER — CARVEDILOL 6.25 MG PO TABS
6.2500 mg | ORAL_TABLET | Freq: Once | ORAL | Status: AC
  Administered 2019-12-05: 6.25 mg via ORAL
  Filled 2019-12-05: qty 1

## 2019-12-05 MED ORDER — LACTATED RINGERS IV SOLN: INTRAVENOUS | Status: DC | PRN

## 2019-12-05 MED ORDER — ALBUTEROL SULFATE HFA 108 (90 BASE) MCG/ACT IN AERS
INHALATION_SPRAY | RESPIRATORY_TRACT | Status: AC
  Filled 2019-12-05: qty 6.7

## 2019-12-05 MED ORDER — ONDANSETRON HCL 4 MG/2ML IJ SOLN
INTRAMUSCULAR | Status: DC | PRN
Start: 2019-12-05 — End: 2019-12-05
  Administered 2019-12-05: 4 mg via INTRAVENOUS

## 2019-12-05 MED ORDER — ALUM & MAG HYDROXIDE-SIMETH 200-200-20 MG/5ML PO SUSP: 30.0000 mL | ORAL | Status: DC | PRN

## 2019-12-05 MED ORDER — PROPOFOL 10 MG/ML IV BOLUS
INTRAVENOUS | Status: DC | PRN
Start: 2019-12-05 — End: 2019-12-05
  Administered 2019-12-05: 140 mg via INTRAVENOUS

## 2019-12-05 MED ORDER — ENOXAPARIN SODIUM 40 MG/0.4ML ~~LOC~~ SOLN
40.0000 mg | SUBCUTANEOUS | Status: DC
  Administered 2019-12-05 – 2019-12-06 (×2): 40 mg via SUBCUTANEOUS
  Filled 2019-12-05 (×2): qty 0.4

## 2019-12-05 MED ORDER — MIDAZOLAM HCL 2 MG/2ML IJ SOLN
INTRAMUSCULAR | Status: DC | PRN
Start: 2019-12-05 — End: 2019-12-05
  Administered 2019-12-05: 2 mg via INTRAVENOUS

## 2019-12-05 MED ORDER — ONDANSETRON HCL 4 MG/2ML IJ SOLN
INTRAMUSCULAR | Status: AC
  Filled 2019-12-05: qty 2

## 2019-12-05 MED ORDER — OXYCODONE HCL 5 MG PO TABS: 5.0000 mg | ORAL_TABLET | Freq: Once | ORAL | Status: DC | PRN

## 2019-12-05 MED ORDER — LIDOCAINE 2% (20 MG/ML) 5 ML SYRINGE
INTRAMUSCULAR | Status: DC | PRN
  Administered 2019-12-05: 60 mg via INTRAVENOUS

## 2019-12-05 MED ORDER — OXYCODONE HCL 5 MG/5ML PO SOLN: 5.0000 mg | Freq: Once | ORAL | Status: DC | PRN

## 2019-12-05 MED ORDER — FENTANYL CITRATE (PF) 100 MCG/2ML IJ SOLN: 25.0000 ug | INTRAMUSCULAR | Status: DC | PRN

## 2019-12-05 MED ORDER — DOCUSATE SODIUM 100 MG PO CAPS
100.0000 mg | ORAL_CAPSULE | Freq: Two times a day (BID) | ORAL | Status: DC
  Administered 2019-12-05 – 2019-12-06 (×3): 100 mg via ORAL
  Filled 2019-12-05 (×3): qty 1

## 2019-12-05 MED ORDER — INSULIN ASPART 100 UNIT/ML ~~LOC~~ SOLN
0.0000 [IU] | Freq: Three times a day (TID) | SUBCUTANEOUS | Status: DC
  Administered 2019-12-06 (×3): 5 [IU] via SUBCUTANEOUS

## 2019-12-05 MED ORDER — ACETAMINOPHEN 325 MG PO TABS: 325.0000 mg | ORAL_TABLET | Freq: Four times a day (QID) | ORAL | Status: DC | PRN

## 2019-12-05 MED ORDER — SENNOSIDES-DOCUSATE SODIUM 8.6-50 MG PO TABS: 1.0000 | ORAL_TABLET | Freq: Every evening | ORAL | Status: DC | PRN

## 2019-12-05 MED ORDER — TRAZODONE HCL 100 MG PO TABS
100.0000 mg | ORAL_TABLET | Freq: Every day | ORAL | Status: DC
  Administered 2019-12-05: 100 mg via ORAL
  Filled 2019-12-05: qty 1

## 2019-12-05 MED ORDER — LACTATED RINGERS IR SOLN
Status: DC | PRN
  Administered 2019-12-05: 6000 mL

## 2019-12-05 MED ORDER — BUPIVACAINE LIPOSOME 1.3 % IJ SUSP
INTRAMUSCULAR | Status: DC | PRN
  Administered 2019-12-05: 10 mL via PERINEURAL

## 2019-12-05 MED ORDER — PROMETHAZINE HCL 25 MG/ML IJ SOLN: 6.2500 mg | INTRAMUSCULAR | Status: DC | PRN

## 2019-12-05 MED ORDER — DEXAMETHASONE SODIUM PHOSPHATE 4 MG/ML IJ SOLN
INTRAMUSCULAR | Status: DC | PRN
Start: 2019-12-05 — End: 2019-12-05
  Administered 2019-12-05: 8 mg via INTRAVENOUS

## 2019-12-05 MED ORDER — FENTANYL CITRATE (PF) 100 MCG/2ML IJ SOLN
INTRAMUSCULAR | Status: DC | PRN
  Administered 2019-12-05 (×2): 50 ug via INTRAVENOUS

## 2019-12-05 MED ORDER — LIDOCAINE 2% (20 MG/ML) 5 ML SYRINGE
INTRAMUSCULAR | Status: AC
  Filled 2019-12-05: qty 5

## 2019-12-05 MED ORDER — ROCURONIUM BROMIDE 10 MG/ML (PF) SYRINGE
PREFILLED_SYRINGE | INTRAVENOUS | Status: DC | PRN
Start: 2019-12-05 — End: 2019-12-05
  Administered 2019-12-05: 50 mg via INTRAVENOUS

## 2019-12-05 MED ORDER — BUPIVACAINE-EPINEPHRINE 0.5% -1:200000 IJ SOLN
INTRAMUSCULAR | Status: AC
  Filled 2019-12-05: qty 1

## 2019-12-05 MED ORDER — SODIUM CHLORIDE 0.9 % IV SOLN: INTRAVENOUS | Status: DC

## 2019-12-05 MED ORDER — INSULIN ASPART 100 UNIT/ML ~~LOC~~ SOLN
4.0000 [IU] | Freq: Three times a day (TID) | SUBCUTANEOUS | Status: DC
  Administered 2019-12-06 (×3): 4 [IU] via SUBCUTANEOUS

## 2019-12-05 MED ORDER — AMLODIPINE BESYLATE 10 MG PO TABS
10.0000 mg | ORAL_TABLET | Freq: Every day | ORAL | Status: DC
  Administered 2019-12-05 – 2019-12-06 (×2): 10 mg via ORAL
  Filled 2019-12-05 (×2): qty 1

## 2019-12-05 MED ORDER — ONDANSETRON HCL 4 MG PO TABS: 4.0000 mg | ORAL_TABLET | Freq: Four times a day (QID) | ORAL | Status: DC | PRN

## 2019-12-05 MED ORDER — ONDANSETRON HCL 4 MG/2ML IJ SOLN: 4.0000 mg | Freq: Four times a day (QID) | INTRAMUSCULAR | Status: DC | PRN

## 2019-12-05 MED ORDER — BUPIVACAINE-EPINEPHRINE (PF) 0.5% -1:200000 IJ SOLN
INTRAMUSCULAR | Status: DC | PRN
  Administered 2019-12-05: 13 mL

## 2019-12-05 MED ORDER — INSULIN ASPART 100 UNIT/ML ~~LOC~~ SOLN
0.0000 [IU] | Freq: Every day | SUBCUTANEOUS | Status: DC
  Administered 2019-12-05: 5 [IU] via SUBCUTANEOUS

## 2019-12-05 MED ORDER — MIDAZOLAM HCL 2 MG/2ML IJ SOLN
INTRAMUSCULAR | Status: AC
  Filled 2019-12-05: qty 2

## 2019-12-05 MED ORDER — ROCURONIUM BROMIDE 10 MG/ML (PF) SYRINGE
PREFILLED_SYRINGE | INTRAVENOUS | Status: AC
  Filled 2019-12-05: qty 10

## 2019-12-05 MED ORDER — CARVEDILOL 6.25 MG PO TABS
6.2500 mg | ORAL_TABLET | Freq: Two times a day (BID) | ORAL | Status: DC
  Administered 2019-12-05 – 2019-12-06 (×3): 6.25 mg via ORAL
  Filled 2019-12-05 (×3): qty 1

## 2019-12-05 MED ORDER — HYDROMORPHONE HCL 1 MG/ML IJ SOLN
0.5000 mg | INTRAMUSCULAR | Status: DC | PRN
  Administered 2019-12-06: 1 mg via INTRAVENOUS
  Filled 2019-12-05: qty 1

## 2019-12-05 MED ORDER — LEVOTHYROXINE SODIUM 112 MCG PO TABS
112.0000 ug | ORAL_TABLET | Freq: Every day | ORAL | Status: DC
  Administered 2019-12-06: 112 ug via ORAL
  Filled 2019-12-05: qty 1

## 2019-12-05 MED ORDER — OXYCODONE HCL 5 MG PO TABS: 5.0000 mg | ORAL_TABLET | Freq: Four times a day (QID) | ORAL | 0 refills | Status: DC | PRN

## 2019-12-05 MED ORDER — ALBUTEROL SULFATE (2.5 MG/3ML) 0.083% IN NEBU: 2.5000 mg | INHALATION_SOLUTION | Freq: Four times a day (QID) | RESPIRATORY_TRACT | Status: DC | PRN

## 2019-12-05 MED ORDER — ADULT MULTIVITAMIN W/MINERALS CH
1.0000 | ORAL_TABLET | Freq: Every day | ORAL | Status: DC
  Administered 2019-12-05 – 2019-12-06 (×2): 1 via ORAL
  Filled 2019-12-05 (×2): qty 1

## 2019-12-05 MED ORDER — ALBUTEROL SULFATE HFA 108 (90 BASE) MCG/ACT IN AERS
INHALATION_SPRAY | RESPIRATORY_TRACT | Status: DC | PRN
  Administered 2019-12-05: 2 via RESPIRATORY_TRACT
  Administered 2019-12-05: 1 via RESPIRATORY_TRACT

## 2019-12-05 MED ORDER — CEFAZOLIN SODIUM-DEXTROSE 2-4 GM/100ML-% IV SOLN
2.0000 g | INTRAVENOUS | Status: AC
  Administered 2019-12-05: 2 g via INTRAVENOUS
  Filled 2019-12-05: qty 100

## 2019-12-05 MED ORDER — MENTHOL 3 MG MT LOZG: 1.0000 | LOZENGE | OROMUCOSAL | Status: DC | PRN

## 2019-12-05 MED ORDER — ATORVASTATIN CALCIUM 20 MG PO TABS
20.0000 mg | ORAL_TABLET | Freq: Every day | ORAL | Status: DC
  Administered 2019-12-05 – 2019-12-06 (×2): 20 mg via ORAL
  Filled 2019-12-05 (×2): qty 1

## 2019-12-05 SURGICAL SUPPLY — 61 items
ANCHOR SUT 5.5 PEEK (Miscellaneous) ×2 IMPLANT
BAG DECANTER FOR FLEXI CONT (MISCELLANEOUS) IMPLANT
BENZOIN TINCTURE PRP APPL 2/3 (GAUZE/BANDAGES/DRESSINGS) ×2 IMPLANT
BLADE AVERAGE 25X9 (BLADE) IMPLANT
BLADE EXCALIBUR 4.0X13 (MISCELLANEOUS) IMPLANT
BLADE SURG SZ11 CARB STEEL (BLADE) ×2 IMPLANT
BNDG COHESIVE 4X5 TAN STRL (GAUZE/BANDAGES/DRESSINGS) ×2 IMPLANT
BURR OVAL 8 FLU 5.0X13 (MISCELLANEOUS) ×2 IMPLANT
CANNULA 5.75X71 LONG (CANNULA) IMPLANT
CANNULA ACUFLEX KIT 5X76 (CANNULA) ×2 IMPLANT
CLSR STERI-STRIP ANTIMIC 1/2X4 (GAUZE/BANDAGES/DRESSINGS) ×2 IMPLANT
COVER WAND RF STERILE (DRAPES) IMPLANT
DECANTER SPIKE VIAL GLASS SM (MISCELLANEOUS) IMPLANT
DISSECTOR 4.0MM X 13CM (MISCELLANEOUS) ×2 IMPLANT
DRAPE IMP U-DRAPE 54X76 (DRAPES) ×2 IMPLANT
DRAPE SHOULDER BEACH CHAIR (DRAPES) ×2 IMPLANT
DRAPE SURG 17X23 STRL (DRAPES) ×2 IMPLANT
DRAPE U-SHAPE 47X51 STRL (DRAPES) ×2 IMPLANT
DRSG EMULSION OIL 3X3 NADH (GAUZE/BANDAGES/DRESSINGS) ×4 IMPLANT
DRSG PAD ABDOMINAL 8X10 ST (GAUZE/BANDAGES/DRESSINGS) ×2 IMPLANT
DURAPREP 26ML APPLICATOR (WOUND CARE) ×2 IMPLANT
ELECT NEEDLE TIP 2.8 STRL (NEEDLE) IMPLANT
ELECT REM PT RETURN 15FT ADLT (MISCELLANEOUS) ×2 IMPLANT
GAUZE 4X4 16PLY RFD (DISPOSABLE) IMPLANT
GAUZE SPONGE 4X4 12PLY STRL (GAUZE/BANDAGES/DRESSINGS) ×2 IMPLANT
GLOVE BIO SURGEON STRL SZ8.5 (GLOVE) ×2 IMPLANT
GLOVE BIOGEL PI IND STRL 8 (GLOVE) ×1 IMPLANT
GLOVE BIOGEL PI IND STRL 8.5 (GLOVE) ×1 IMPLANT
GLOVE BIOGEL PI INDICATOR 8 (GLOVE) ×1
GLOVE BIOGEL PI INDICATOR 8.5 (GLOVE) ×1
GLOVE ECLIPSE 8.0 STRL XLNG CF (GLOVE) ×4 IMPLANT
GOWN STRL REUS W/ TWL XL LVL3 (GOWN DISPOSABLE) IMPLANT
GOWN STRL REUS W/TWL 2XL LVL3 (GOWN DISPOSABLE) ×2 IMPLANT
GOWN STRL REUS W/TWL XL LVL3 (GOWN DISPOSABLE)
KIT BASIN (CUSTOM PROCEDURE TRAY) ×2 IMPLANT
MANIFOLD NEPTUNE II (INSTRUMENTS) IMPLANT
NEEDLE 1/2 CIR CATGUT .05X1.09 (NEEDLE) ×2 IMPLANT
NEEDLE SCORPION MULTI FIRE (NEEDLE) IMPLANT
NEEDLE SPNL 18GX3.5 QUINCKE PK (NEEDLE) ×2 IMPLANT
NS IRRIG 1000ML POUR BTL (IV SOLUTION) IMPLANT
PACK SHOULDER (CUSTOM PROCEDURE TRAY) ×2 IMPLANT
PEEK ANCHOR ALLTHREAD 5.5M SZ2 (Anchor) ×2 IMPLANT
PENCIL SMOKE EVACUATOR (MISCELLANEOUS) IMPLANT
PORT APPOLLO RF 90DEGREE MULTI (SURGICAL WAND) ×2 IMPLANT
RESTRAINT HEAD UNIVERSAL NS (MISCELLANEOUS) ×2 IMPLANT
SLING ARM FOAM STRAP LRG (SOFTGOODS) IMPLANT
SLING ARM FOAM STRAP MED (SOFTGOODS) IMPLANT
SPONGE LAP 4X18 RFD (DISPOSABLE) ×4 IMPLANT
STAPLER VISISTAT 35W (STAPLE) IMPLANT
STRIP CLOSURE SKIN 1/2X4 (GAUZE/BANDAGES/DRESSINGS) IMPLANT
SUCTION FRAZIER HANDLE 10FR (MISCELLANEOUS)
SUCTION TUBE FRAZIER 10FR DISP (MISCELLANEOUS) IMPLANT
SUT ETHIBOND 0 (SUTURE) ×2 IMPLANT
SUT ETHILON 3 0 PS 1 (SUTURE) IMPLANT
SUT MNCRL AB 3-0 PS2 18 (SUTURE) ×2 IMPLANT
SUT VIC AB 2-0 SH 27 (SUTURE) ×1
SUT VIC AB 2-0 SH 27XBRD (SUTURE) ×1 IMPLANT
TOWEL SURG RFD BLUE STRL DISP (DISPOSABLE) ×2 IMPLANT
TUBING ARTHROSCOPY IRRIG 16FT (MISCELLANEOUS) ×2 IMPLANT
WATER STERILE IRR 1000ML POUR (IV SOLUTION) ×2 IMPLANT
YANKAUER SUCT BULB TIP NO VENT (SUCTIONS) ×2 IMPLANT

## 2019-12-05 NOTE — Op Note (Signed)
The recent History & Physical has been reviewed. I have personally examined the patient today. There is no interval change to the documented History & Physical. The patient would like to proceed with the procedure.  [name] 12/05/2019,  7:36 AM

## 2019-12-05 NOTE — Progress Notes (Signed)
Spoke with D. Alexander regarding tele order. [facility] does not offer tele. She stated to dc the order. Clarifying again with night on call provider due to having a transfer order.

## 2019-12-05 NOTE — Op Note (Signed)
PATIENT ID:      [name]  MRN:     [id number] DOB/AGE:    12/28/1960 / 59 y.o.       OPERATIVE REPORT    DATE OF PROCEDURE:  12/05/2019       PREOPERATIVE DIAGNOSIS:   right shoulder impingement syndrome, acromioclavicular arthritis,rotator cuff tear                                                       Estimated body mass index is 28.38 kg/m as calculated from the following:   Height as of this encounter: 5\' 2"  (1.575 m).   Weight as of this encounter: 70.4 kg.     POSTOPERATIVE DIAGNOSIS:   right shoulder impingement syndrome, acromioclavicular,rotator cuff tear with biceps tendonopathy                                                                      Estimated body mass index is 28.38 kg/m as calculated from the following:   Height as of this encounter: 5\' 2"  (1.575 m).   Weight as of this encounter: 70.4 kg.     PROCEDURE:  Procedure(s): SHOULDER ARTHROSCOPY, SUBACROMIAL DECOMPRESSION AND DISTAL CLAVICLE RESECTION, MINI OPEN ROTATOR CUFF REPAIR AND BICEPS TENODESIS     SURGEON:  , MD    ASSISTANT:   , PA-C   (Present and scrubbed throughout the case, critical for assistance with exposure, retraction, instrumentation, and closure.)          ANESTHESIA: regional and general     DRAINS: none :      TOURNIQUET TIME: * No tourniquets in log *    COMPLICATIONS:  None   CONDITION:  stable  PROCEDURE IN DETAIL: [name]   [name] 12/05/2019, 9:26 AM

## 2019-12-05 NOTE — Progress Notes (Signed)
Patient wants to include password in chart for access for information

## 2019-12-05 NOTE — Anesthesia Procedure Notes (Signed)
Anesthesia Regional Block: Interscalene brachial plexus block   Pre-Anesthetic Checklist: ,, timeout performed, Correct Patient, Correct Site, Correct Laterality, Correct Procedure, Correct Position, site marked, Risks and benefits discussed,  Surgical consent,  Pre-op evaluation,  At surgeon's request and post-op pain management  Laterality: Right  Prep: chloraprep       Needles:  Injection technique: Single-shot  Needle Type: Echogenic Needle     Needle Length: 5cm  Needle Gauge: 21     Additional Needles:   Narrative:  Start time: 12/05/2019 7:00 AM End time: 12/05/2019 7:04 AM Injection made incrementally with aspirations every 5 mL.  Performed by: Personally  Anesthesiologist: Beryle Lathe, MD  Additional Notes: No pain on injection. No increased resistance to injection. Injection made in 5cc increments. Good needle visualization. Patient tolerated the procedure well.

## 2019-12-05 NOTE — Anesthesia Postprocedure Evaluation (Signed)
Anesthesia Post Note  Patient: [name]  Procedure(s) Performed: SHOULDER ARTHROSCOPY, SUBACROMIAL DECOMPRESSION AND DISTAL CLAVICLE RESECTION, MINI OPEN ROTATOR CUFF REPAIR (POSSIBLE DERMASPAN PATCH) (Right Shoulder)     Patient location during evaluation: PACU Anesthesia Type: General Level of consciousness: awake and alert Pain management: pain level controlled Vital Signs Assessment: post-procedure vital signs reviewed and stable Respiratory status: spontaneous breathing, nonlabored ventilation and respiratory function stable Cardiovascular status: blood pressure returned to baseline and stable Postop Assessment: no apparent nausea or vomiting Anesthetic complications: no    Last Vitals:  Vitals:   12/05/19 1100 12/05/19 1115  BP: 124/60 135/72  Pulse: 65 (!) 58  Resp: 15 (!) 21  Temp:    SpO2: 92% 91%    Last Pain:  Vitals:   12/05/19 1115  TempSrc:   PainSc: 0-No pain                 Beryle Lathe

## 2019-12-05 NOTE — Anesthesia Procedure Notes (Signed)
Procedure Name: Intubation Date/Time: 12/05/2019 7:39 AM Performed by: [place] Kymoni Monday C, CRNA Pre-anesthesia Checklist: Patient identified, Emergency Drugs available, Suction available and Patient being monitored Patient Re-evaluated:Patient Re-evaluated prior to induction Oxygen Delivery Method: Circle system utilized Preoxygenation: Pre-oxygenation with 100% oxygen Induction Type: IV induction Ventilation: Mask ventilation without difficulty Laryngoscope Size: Mac and 3 Grade View: Grade I Tube type: Oral Tube size: 7.0 mm Number of attempts: 1 Airway Equipment and Method: Stylet and Oral airway Placement Confirmation: ETT inserted through vocal cords under direct vision,  positive ETCO2 and breath sounds checked- equal and bilateral Secured at: 20 cm Tube secured with: Tape Dental Injury: Teeth and Oropharynx as per pre-operative assessment

## 2019-12-05 NOTE — Progress Notes (Signed)
Spoke with on call APP. Patient will not transfer at this time.

## 2019-12-05 NOTE — Op Note (Signed)
[name], [name] MEDICAL RECORD ZO:[id number] ACCOUNT [id number] DATE OF BIRTH:15-Sep-1960 FACILITY: [facility] LOCATION: [facility] PHYSICIAN:Nereida Schepp Sharlotte Alamo, MD  OPERATIVE REPORT  DATE OF PROCEDURE:  12/05/2019  PREOPERATIVE DIAGNOSIS:  Rotator cuff tear, right shoulder with impingement and degenerative arthritis, acromioclavicular joint.  POSTOPERATIVE DIAGNOSIS:  Rotator cuff tear, right shoulder with impingement and degenerative arthritis, acromioclavicular joint with biceps tendinopathy.  PROCEDURE: 1.  Diagnostic arthroscopy, right shoulder. 2.  Arthroscopic subacromial decompression. 3.  Arthroscopic distal clavicle resection. 4.  Mini open rotator cuff tear repair and biceps tenodesis.  SURGEON:  Joni Fears, MD  ASSISTANT:  Biagio Borg, PA-C  ANESTHESIA:  General with supplemental interscalene nerve block.  COMPLICATIONS:  None.  HISTORY:  This 59 year old female has been experiencing right shoulder pain for several years.  She was initially evaluated in late 2019 with an MRI scan demonstrating a full-thickness tear of the supraspinatus.  She was scheduled to have a rotator cuff  tear repair last year, but canceled and now with improved health she is continuing to have trouble with her shoulder to the point of compromise.  She is now to have an arthroscopic evaluation of mini open rotator cuff tear repair.  DESCRIPTION OF PROCEDURE:  The patient was met in the holding area and identified the right shoulder as appropriate operative site and marked it accordingly.  Anesthesia performed an interscalene nerve block without complication.  The patient was then transported to room #7.  She was carefully and comfortably placed on the operating table.  General anesthesia was induced without difficulty.  She was then placed in a semi-sitting position with the shoulder frame.  Timeout was  called.  Examination of the right shoulder under anesthesia revealed no evidence of  adhesive capsulitis or instability.  Right shoulder was then prepped with DuraPrep from the base of the neck circumferentially below the elbow.  Sterile draping was performed.  Timeout was called again.  Right shoulder was then marked for the arthroscopic portals, the [name] joint and the acromion.  At a point a fingerbreadth posterior and medial to the posterior angle of the acromion, a small stab wound was made and the arthroscope easily placed into the  shoulder joint.  Diagnostic arthroscopy revealed some fraying of the labrum.  I saw minimal degenerative change of the glenoid and humeral head.  There was an obvious full-thickness tear of the supraspinatus as I could easily visualize the subacromial  space.  I thought the labrum was intact.  The biceps tendon was quite thin and frayed near its attachment to the glenoid.  Accordingly, I proceeded with a biceps tenodesis and released the tendon from its superior attachment using the Bovie and a 2nd  arthroscopic portal placed anteriorly.  I also debrided the frayed labrum.  There were no loose bodies.  Subscapularis was intact.  Arthroscope was then placed in subacromial space posteriorly, the cannula in the subacromial space anteriorly, and a 3rd portal was established in the lateral subacromial space.  Now arthroscopic subacromial decompression was performed.  There was  anterior and lateral overhang of the acromion.  A 6 mm bur was used to perform an acromioplasty with a nice flat resection.  There were obvious degenerative and bulky changes about the [facility] joint.  Distal clavicle resection was performed with the same 6 mm bur.  I then applied a 2nd pair of gloves.  We cleaned the shoulder with saline and then performed a mini open rotator cuff tear repair and biceps tenodesis.  Just over an  inch incision was made along the anterior aspect of the shoulder via sharp dissection  carried down to subcutaneous tissue.  A raphae in the deltoid fascia was  identified and incised.  Subacromial space was entered and a self-retaining retractor was inserted.  Biceps tendon was retrieved and then tenodesed at the inferior groove with a 5.5  mm PEEK anchor with attached #2 FiberWire.  The extraneous portion of the tendon was amputated with a knife.  The incision within the retinaculum was closed with a running 0 Vicryl.  I released any adhesions of the rotator cuff so that I could easily approximate the edges.  It was torn in a V fashion with the apex of the V of superiorly.  I was able to approximate the edges of the supraspinatus without difficulty and starting at the  apex suture with a running #1 Ethibond suture to the base.  I inserted a single 5.5 mm PEEK anchor to supplement the repair at the base.  Had very nice coverage with no tension.  There was some atrophy of the supraspinatus and minimally of the  infraspinatus.  The wound was then copiously irrigated with saline solution.  Deltoid fascia was closed with a running 0 Vicryl, subcutaneous with 3-0 Monocryl, skin closed with Steri-Strips over benzoin.  Sterile bulky dressing was applied followed by a sling.  The patient tolerated the procedure without complications.  She will be assessed in the recovery room for possible overnight observation.  Percocet for pain.  CN/NUANCE  D:12/05/2019 T:12/05/2019 JOB:[id number]

## 2019-12-05 NOTE — Transfer of Care (Signed)
Immediate Anesthesia Transfer of Care Note  Patient: [name]  Procedure(s) Performed: SHOULDER ARTHROSCOPY, SUBACROMIAL DECOMPRESSION AND DISTAL CLAVICLE RESECTION, MINI OPEN ROTATOR CUFF REPAIR (POSSIBLE DERMASPAN PATCH) (Right Shoulder)  Patient Location: PACU  Anesthesia Type:GA combined with regional for post-op pain  Level of Consciousness: awake, alert  and oriented  Airway & Oxygen Therapy: Patient Spontanous Breathing and Patient connected to face mask oxygen  Post-op Assessment: Report given to RN and Post -op Vital signs reviewed and stable  Post vital signs: Reviewed and stable  Last Vitals:  Vitals Value Taken Time  BP 124/77 12/05/19 0949  Temp 36.7 C 12/05/19 0949  Pulse 66 12/05/19 0955  Resp 19 12/05/19 0955  SpO2 100 % 12/05/19 0955  Vitals shown include unvalidated device data.  Last Pain:  Vitals:   12/05/19 0949  TempSrc:   PainSc: 0-No pain      Patients Stated Pain Goal: 4 (12/05/19 0551)  Complications: No apparent anesthesia complications

## 2019-12-05 NOTE — Consult Note (Signed)
Reason for Consult: decreased SaO2 Referring Physician:  Valeria Batman, MD orthopedic service    Pascuala Klutts is an 59 y.o. female.  HPI: per report from ortho PA, pt was coming out of surgery from rotator cuff repair this morning and experienced drop in SaO2, ortho is keeping her for obs and has asked medicine to consult. Pt reports feeling SOB and would feel more comfortable staying overnight for monitoring given her cardiac history. She has not yet been up to ambulate. No coughing. No chest tightness.     Past Medical History:  Diagnosis Date  . Abdominal pain, right lower quadrant   . Abdominal wall pain   . Arterial bruit   . Arthritis   . Arthropathy   . Barrett's esophagus   . Callus   . Carotid stenosis, bilateral   . Chest pain in adult   . Chronic back pain   . Colon polyp   . Coronary artery disease, occlusive    stent x 2  . Depression   . Diabetic neuropathy (HCC) 06/02/2018   type 2  . Eosinophilic esophagitis   . Eosinophilic gastritis   . Fatigue   . Gastritis   . Gastroesophagitis   . GERD (gastroesophageal reflux disease)   . H. pylori infection   . H/O: hysterectomy   . Hypersomnia   . IBS (irritable bowel syndrome)   . Inflammatory bowel disease    colitis  . Inlet patch of esophagus   . Myocardial infarction (HCC)   . Pneumonia   . Precordial pain   . PVD (peripheral vascular disease) (HCC)    pt. denies  . Restless leg syndrome   . Right foot pain   . Shortness of breath on exertion   . Shoulder pain, right   . Ulcer of esophagus without bleeding    Social History   Tobacco Use  Smoking Status Current Every Day Smoker  . Packs/day: 0.50  . Years: 25.00  . Pack years: 12.50  . Types: Cigarettes  Smokeless Tobacco Never Used  Tobacco Comment   Trying to stop smoking    Past Surgical History:  Procedure Laterality Date  . AUGMENTATION MAMMAPLASTY Bilateral    breast lift  . BREAST EXCISIONAL BIOPSY Right   . CORONARY STENT  INTERVENTION N/A 05/27/2017   Procedure: CORONARY STENT INTERVENTION;  Surgeon: Runell Gess, MD;  Location: [facility];  Service: Cardiovascular;  Laterality: N/A;  . EYE SURGERY     laser surgery for hole in eye  . FOOT SURGERY Left   . INTRAVASCULAR PRESSURE WIRE/FFR STUDY N/A 05/27/2017   Procedure: INTRAVASCULAR PRESSURE WIRE/FFR STUDY;  Surgeon: Runell Gess, MD;  Location: [facility];  Service: Cardiovascular;  Laterality: N/A;  . LEFT HEART CATH AND CORONARY ANGIOGRAPHY N/A 05/27/2017   Procedure: LEFT HEART CATH AND CORONARY ANGIOGRAPHY;  Surgeon: Runell Gess, MD;  Location: [facility];  Service: Cardiovascular;  Laterality: N/A;  . THYROIDECTOMY      Family History  Problem Relation Age of Onset  . Hyperlipidemia Mother   . Hypertension Mother   . Diabetes Mellitus II Mother   . Cancer Father   . Hypertension Sister   . Hypertension Brother   . Hypertension Brother   . Hypertension Brother   . Hypertension Sister   . Hypertension Sister   . Heart disease Maternal Aunt     Social History:  reports that she has been smoking cigarettes. She has a  12.50 pack-year smoking history. She has never used smokeless tobacco. She reports that she does not drink alcohol or use drugs.  Allergies:  Allergies  Allergen Reactions  . Motrin [Ibuprofen] Hives  . Bactrim [Sulfamethoxazole-Trimethoprim] Rash  . Sulfa Antibiotics Hives and Rash    Medications: I have reviewed the patient's current medications.  Results for orders placed or performed during the hospital encounter of 12/05/19 (from the past 48 hour(s))  Glucose, capillary     Status: None   Collection Time: 12/05/19  5:47 AM  Result Value Ref Range   Glucose-Capillary 86 70 - 99 mg/dL    Comment: Glucose reference range applies only to samples taken after fasting for at least 8 hours.  Glucose, capillary     Status: Abnormal   Collection Time: 12/05/19  9:57 AM  Result Value Ref  Range   Glucose-Capillary 106 (H) 70 - 99 mg/dL    Comment: Glucose reference range applies only to samples taken after fasting for at least 8 hours.    No results found.  Review of Systems  Constitutional: Negative for activity change and fatigue.  HENT: Negative for congestion.   Respiratory: Positive for shortness of breath. Negative for apnea, cough, choking, chest tightness and wheezing.   Cardiovascular: Negative for chest pain, palpitations and leg swelling.  Gastrointestinal: Negative for abdominal distention.  Musculoskeletal: Negative for gait problem.  Skin: Negative for color change and pallor.  Neurological: Negative for dizziness, syncope and light-headedness.  Psychiatric/Behavioral: Negative for confusion.   Blood pressure 138/85, pulse 70, temperature 97.8 F (36.6 C), resp. rate 18, height 5\' 2"  (1.575 m), weight 70.4 kg, SpO2 97 %. Physical Exam  Constitutional: She is oriented to person, place, and time. She appears well-developed and well-nourished. No distress.  HENT:  Head: Normocephalic and atraumatic.  Eyes: Conjunctivae are normal.  Cardiovascular: Normal rate and regular rhythm.  Murmur heard. Respiratory: No respiratory distress. She has no wheezes. She has no rales.  GI: She exhibits no distension.  Musculoskeletal:        General: No edema.     Cervical back: Normal range of motion and neck supple.  Neurological: She is alert and oriented to person, place, and time.  Skin: Skin is warm and dry.  Psychiatric: Her behavior is normal. Thought content normal.       Assessment/Plan:  Hypoxia, transient SOB, mild Smoker Pt concerned given her hx MI/stents Feels SOB, possible anxiety component to her symptoms? I took O2 off during exam, after few minutes minimum on RA 94% --> given surgery, Wells is 1.5 and cannot r/o PE w/ PERC given age, probability of high D-dimer will go ahead and get CTA  --> monitor SaO2 on tele --> ambulate on and off  O2, may be able to f/u outpatient if hypoxic but otherwise stable and PE r/o --> CBC, CMP, TSH  CAD, HTN No records from her cardiologist Dr Claudie Leach, pt is on DAPT but is >1 year out from PCI. Would defer to outpatient cardiologist unless bleeding develops  --> continue ASA, statin, coreg, lisinopril, norvasc  --> f/u w/ cardiology oitpatient re: ASA/plavix long-term   DM2 --> hold metformin for now, last A1C ok, can skip a few days  Hypothyroid --> TSH  Insomnia  --> continue trazodone   GERD --> continue PPI   Emeterio Reeve 12/05/2019, 4:25 PM

## 2019-12-06 DIAGNOSIS — R0902 Hypoxemia: Secondary | ICD-10-CM | POA: Diagnosis not present

## 2019-12-06 DIAGNOSIS — I10 Essential (primary) hypertension: Secondary | ICD-10-CM | POA: Diagnosis not present

## 2019-12-06 DIAGNOSIS — E7800 Pure hypercholesterolemia, unspecified: Secondary | ICD-10-CM

## 2019-12-06 DIAGNOSIS — M7541 Impingement syndrome of right shoulder: Secondary | ICD-10-CM | POA: Diagnosis not present

## 2019-12-06 LAB — GLUCOSE, CAPILLARY
Glucose-Capillary: 244 mg/dL — ABNORMAL HIGH (ref 70–99)
Glucose-Capillary: 205 mg/dL — ABNORMAL HIGH (ref 70–99)
Glucose-Capillary: 213 mg/dL — ABNORMAL HIGH (ref 70–99)

## 2019-12-06 MED ORDER — ACETAMINOPHEN 325 MG PO TABS: 325.0000 mg | ORAL_TABLET | Freq: Four times a day (QID) | ORAL | Status: DC | PRN

## 2019-12-06 MED ORDER — IPRATROPIUM-ALBUTEROL 0.5-2.5 (3) MG/3ML IN SOLN
3.0000 mL | RESPIRATORY_TRACT | Status: AC | PRN
  Administered 2019-12-06: 3 mL via RESPIRATORY_TRACT
  Filled 2019-12-06: qty 3

## 2019-12-06 MED ORDER — INSULIN GLARGINE 100 UNIT/ML ~~LOC~~ SOLN
25.0000 [IU] | Freq: Once | SUBCUTANEOUS | Status: AC
  Administered 2019-12-06: 25 [IU] via SUBCUTANEOUS
  Filled 2019-12-06: qty 0.25

## 2019-12-06 NOTE — Progress Notes (Signed)
PROGRESS NOTE  Parish Augustine CLE:[id number] DOB: 1961/06/03 DOA: 12/05/2019 PCP: Sandi Mariscal, MD   LOS: 0 days   Brief narrative: As per HPI,  Patient is a 59 years old female with history of diabetes mellitus, hypertension, hyperlipidemia, peripheral vascular disease, right shoulder impingement syndrome, acromioclavicular arthritis and rotator cuff tear was admitted to the hospital and underwent subacromial decompression with distal clavicle resection and rotator cuff repair on 12/05/2019.  Postoperatively patient did have some hypoxia so was observed in the hospital.  Assessment/Plan:  Active Problems:   Coronary artery disease   Essential hypertension   Hyperlipidemia   Restless leg syndrome   PVD (peripheral vascular disease) (HCC)   Hypoxia   Status post right rotator cuff surgery.  As per orthopedics.  Transient hypoxia after surgery.  Smoker.  No history of COPD.  Continue incentive spirometry deep breathing.  Counseled on smoking cessation  Diabetes mellitus type 2.  Will give 1 dose of Lantus today.  Continue Tresiba  Metformin on discharge.  History of coronary artery disease, hypertension.  On aspirin statin, Plavix Coreg lisinopril Norvasc as outpatient.  No acute issues during hospitalization.  Follow-up with cardiology as outpatient.  Hypothyroidism.  On Synthroid  Code Status: Full code  Family Communication: None  Status is: Observation  Dispo:  Patient is medically stable for disposition home.  Would benefit from Flonase spray in her nose for sinus issues.  Will need to follow-up with her primary care physician and cardiologist as outpatient   Procedures: subacromial decompression with distal clavicle resection and rotator cuff repair on 12/05/2019.   Antibiotics:  . none  Anti-infectives (From admission, onward)   Start     Dose/Rate Route Frequency Ordered Stop   12/05/19 0600  ceFAZolin (ANCEF) IVPB 2g/100 mL premix     2 g 200 mL/hr over 30 Minutes  Intravenous On call to O.R. 12/05/19 0533 12/05/19 0739       Subjective: Today, patient was seen and examined at bedside.  Planes of mild right shoulder pain.  Denies dyspnea or shortness of breath.  Off oxygen at this time.  Complains of some sinus issues  Objective: Vitals:   12/06/19 0558 12/06/19 0920  BP: (!) 154/75 115/72  Pulse: 63 (!) 54  Resp: 18   Temp: 97.6 F (36.4 C) 98.4 F (36.9 C)  SpO2: 98% 93%    Intake/Output Summary (Last 24 hours) at 12/06/2019 1218 Last data filed at 12/06/2019 1101 Gross per 24 hour  Intake 1918 ml  Output 3100 ml  Net -1182 ml   Filed Weights   12/05/19 0551  Weight: 70.4 kg   Body mass index is 28.38 kg/m.   Physical Exam: GENERAL: Patient is alert awake and oriented. Not in obvious distress. HENT: No scleral pallor or icterus. Pupils equally reactive to light. Oral mucosa is moist NECK: is supple, no gross swelling noted. CHEST: No wheezes noted..  Diminished breath sounds bilaterally. CVS: S1 and S2 heard, no murmur. Regular rate and rhythm.  ABDOMEN: Soft, non-tender, bowel sounds are present. EXTREMITIES: No edema. CNS: Cranial nerves are intact. No focal motor deficits. SKIN: warm and dry without rashes.  Data Review: I have personally reviewed the following laboratory data and studies,  CBC: Recent Labs  Lab 12/01/19 1143 12/05/19 1826  WBC 7.8 12.9*  NEUTROABS 5.2  --   HGB 13.4 14.2  HCT 41.9 44.1  MCV 88.6 90.4  PLT 236 944   Basic Metabolic Panel: Recent Labs  Lab 12/01/19 1143  12/05/19 1826  NA 142 139  K 3.9 4.3  CL 104 102  CO2 33* 29  GLUCOSE 87 302*  BUN 16 15  CREATININE 0.76 0.76  CALCIUM 9.2 9.4   Liver Function Tests: Recent Labs  Lab 12/01/19 1143 12/05/19 1826  AST 15 24  ALT 15 22  ALKPHOS 123 135*  BILITOT 0.5 0.6  PROT 7.7 8.5*  ALBUMIN 3.6 4.0   No results for input(s): LIPASE, AMYLASE in the last 168 hours. No results for input(s): AMMONIA in the last 168  hours. Cardiac Enzymes: No results for input(s): CKTOTAL, CKMB, CKMBINDEX, TROPONINI in the last 168 hours. BNP (last 3 results) Recent Labs    12/05/19 1826  BNP 138.3*    ProBNP (last 3 results) No results for input(s): PROBNP in the last 8760 hours.  CBG: Recent Labs  Lab 12/05/19 0957 12/05/19 1840 12/05/19 2241 12/06/19 0729 12/06/19 1136  GLUCAP 106* 278* 366* 244* 205*   Recent Results (from the past 240 hour(s))  SARS CORONAVIRUS 2 (TAT 6-24 HRS) Nasopharyngeal Nasopharyngeal Swab     Status: None   Collection Time: 12/01/19 12:34 PM   Specimen: Nasopharyngeal Swab  Result Value Ref Range Status   SARS Coronavirus 2 NEGATIVE NEGATIVE Final    Comment: (NOTE) SARS-CoV-2 target nucleic acids are NOT DETECTED. The SARS-CoV-2 RNA is generally detectable in upper and lower respiratory specimens during the acute phase of infection. Negative results do not preclude SARS-CoV-2 infection, do not rule out co-infections with other pathogens, and should not be used as the sole basis for treatment or other patient management decisions. Negative results must be combined with clinical observations, patient history, and epidemiological information. The expected result is Negative. Fact Sheet for Patients: HairSlick.no Fact Sheet for Healthcare Providers: quierodirigir.com This test is not yet approved or cleared by the [place] FDA and  has been authorized for detection and/or diagnosis of SARS-CoV-2 by FDA under an Emergency Use Authorization (EUA). This EUA will remain  in effect (meaning this test can be used) for the duration of the COVID-19 declaration under Section 56 4(b)(1) of the Act, 21 U.S.C. section 360bbb-3(b)(1), unless the authorization is terminated or revoked sooner. Performed at [facility], [address] [place], [place] [address]      Studies: No results found.    Joycelyn Das,  MD  [facility] 12/06/2019

## 2019-12-06 NOTE — Discharge Summary (Signed)
[name], MD   [name], PA-C [address] [place], [place]  [address]                             [phone]  PATIENT ID: [name]        MRN:  [id number]          DOB/AGE: [birth year] / 59 y.o.    DISCHARGE SUMMARY  ADMISSION DATE:    12/05/2019 DISCHARGE DATE:   12/06/2019   ADMISSION DIAGNOSIS: Hypoxia [R09.02]    DISCHARGE DIAGNOSIS:  right shoulder impingement syndrome, acromioclavicular arthritis    ADDITIONAL DIAGNOSIS: Active Problems:   Coronary artery disease   Essential hypertension   Hyperlipidemia   Restless leg syndrome   PVD (peripheral vascular disease) (HCC)   Hypoxia  Past Medical History:  Diagnosis Date  . Abdominal pain, right lower quadrant   . Abdominal wall pain   . Arterial bruit   . Arthritis   . Arthropathy   . Barrett's esophagus   . Callus   . Carotid stenosis, bilateral   . Chest pain in adult   . Chronic back pain   . Colon polyp   . Coronary artery disease, occlusive    stent x 2  . Depression   . Diabetic neuropathy (HCC) 06/02/2018   type 2  . Eosinophilic esophagitis   . Eosinophilic gastritis   . Fatigue   . Gastritis   . Gastroesophagitis   . GERD (gastroesophageal reflux disease)   . H. pylori infection   . H/O: hysterectomy   . Hypersomnia   . IBS (irritable bowel syndrome)   . Inflammatory bowel disease    colitis  . Inlet patch of esophagus   . Myocardial infarction (HCC)   . Pneumonia   . Precordial pain   . PVD (peripheral vascular disease) (HCC)    pt. denies  . Restless leg syndrome   . Right foot pain   . Shortness of breath on exertion   . Shoulder pain, right   . Ulcer of esophagus without bleeding     PROCEDURE: Procedure(s): SHOULDER ARTHROSCOPY, SUBACROMIAL DECOMPRESSION AND DISTAL CLAVICLE RESECTION, MINI OPEN ROTATOR CUFF REPAIR (POSSIBLE DERMASPAN PATCH) Right on 12/05/2019  CONSULTS: Hospitalist Service    HISTORY: [name] is a 59 year old female who has been seen  recently in our office for evaluation of her right shoulder pain.  She apparently had problems with her right shoulder about a year ago or so when at that time surgery was discussed.  She was scheduled but then that was canceled.  She returned to the office for reevaluation.  Is still felt that she has a rotator cuff involvement and the surgery is indicated.  She is now unable to raise her arm.  She did slip and fall over the winter on some ice landing onto her right shoulder.  She continues to hurt.    HOSPITAL COURSE:  [name] is a 59 y.o. admitted on 12/05/2019 and found to have a diagnosis of right shoulder impingement syndrome, acromioclavicular arthritis and rotator cuff tear.  After appropriate laboratory studies were obtained  they were taken to the operating room on 12/05/2019 and underwent  Procedure(s): SHOULDER ARTHROSCOPY, SUBACROMIAL DECOMPRESSION AND DISTAL CLAVICLE RESECTION, MINI OPEN ROTATOR CUFF REPAIR (POSSIBLE DERMASPAN PATCH)  Right.   They were given perioperative antibiotics:  Anti-infectives (From admission, onward)   Start     Dose/Rate Route Frequency Ordered Stop  12/05/19 0600  ceFAZolin (ANCEF) IVPB 2g/100 mL premix     2 g 200 mL/hr over 30 Minutes Intravenous On call to O.R. 12/05/19 4854 12/05/19 0739    .  Tolerated the procedure well.  Apparently had hypoxia post extubation and a consult for evaluation by the Hospitalist service was ordered for their help in treatment.  POD #1, allowed out of bed to a chair. Apparently improved and approval for discharge was obtained.   The remainder of the hospital course was dedicated to ambulation and strengthening.   The patient was discharged on 1 Day Post-Op in  Stable condition.  Blood products given:none  DIAGNOSTIC STUDIES: Recent vital signs:  Patient Vitals for the past 24 hrs:  BP Temp Temp src Pulse Resp SpO2  12/06/19 0920 115/72 98.4 F (36.9 C) Oral (!) 54 -- 93 %  12/06/19 0558 (!) 154/75 97.6 F (36.4  C) Oral 63 18 98 %  12/06/19 0202 136/72 98 F (36.7 C) Oral 79 20 94 %  12/05/19 2101 (!) 157/85 98.1 F (36.7 C) Oral 86 18 99 %  12/05/19 2030 (!) 157/90 97.7 F (36.5 C) Oral 86 18 --  12/05/19 1927 (!) 150/78 97.8 F (36.6 C) Oral 86 18 98 %  12/05/19 1833 (!) 157/77 98 F (36.7 C) Oral 68 16 94 %  12/05/19 1728 (!) 147/80 98.1 F (36.7 C) -- 67 15 95 %  12/05/19 1700 (!) 167/92 98.1 F (36.7 C) -- 71 14 97 %  12/05/19 1600 138/85 -- -- 70 18 97 %  12/05/19 1500 (!) 146/72 -- -- 66 16 95 %  12/05/19 1400 (!) 154/74 -- -- (!) 58 20 100 %  12/05/19 1300 138/71 -- -- 60 18 96 %  12/05/19 1230 128/73 97.8 F (36.6 C) -- 66 (!) 23 97 %  12/05/19 1215 124/78 -- -- 67 (!) 25 93 %       Recent laboratory studies: Recent Labs    12/01/19 1143 12/05/19 1826  WBC 7.8 12.9*  HGB 13.4 14.2  HCT 41.9 44.1  PLT 236 219   Recent Labs    12/01/19 1143 12/05/19 1826  NA 142 139  K 3.9 4.3  CL 104 102  CO2 33* 29  BUN 16 15  CREATININE 0.76 0.76  GLUCOSE 87 302*  CALCIUM 9.2 9.4   Lab Results  Component Value Date   INR 1.0 12/01/2019   INR 0.93 05/27/2017     Recent Radiographic Studies :  XR Shoulder Right  Result Date: 11/21/2019 Films of the right shoulder obtained in several projections.  There may be slight elevation of the humeral head and the glenoid.  I think the space between the humeral head and the acromium is normal but probably upper limits of normal.  No ectopic calcification.  There is lateral curvature of the acromion.  There are some degenerative changes of the [facility] joint.  No ectopic calcification.  No obvious arthritis of the glenohumeral joint.  Patient has a history of a rotator cuff tear   DISCHARGE INSTRUCTIONS: Discharge Instructions    Call MD / Call 911   Complete by: As directed    If you experience chest pain or shortness of breath, CALL 911 and be transported to the hospital emergency room.  If you develope a fever above 101 F, pus (white  drainage) or increased drainage or redness at the wound, or calf pain, call your surgeon's office.   Call MD / Call 911  Complete by: As directed    If you experience chest pain or shortness of breath, CALL 911 and be transported to the hospital emergency room.  If you develope a fever above 101 F, pus (white drainage) or increased drainage or redness at the wound, or calf pain, call your surgeon's office.   Constipation Prevention   Complete by: As directed    Drink plenty of fluids.  Prune juice may be helpful.  You may use a stool softener, such as Colace (over the counter) 100 mg twice a day.  Use MiraLax (over the counter) for constipation as needed.   Constipation Prevention   Complete by: As directed    Drink plenty of fluids.  Prune juice may be helpful.  You may use a stool softener, such as Colace (over the counter) 100 mg twice a day.  Use MiraLax (over the counter) for constipation as needed.   Diet Carb Modified   Complete by: As directed    Discharge instructions   Complete by: As directed    Arthroscopic Shoulder Surgery Post-OP Instructions:  You have just had an arthroscopic operation. Your incisions (puncture sites) are small and should heal quickly. The structures inside your shoulder  may take 8-12 weeks to heal and settle down.  Pain Medication: You have pain medicine already prescribed from another physician.  The hydrocodone/acetaminophen is what you have and may continue to use one tab every 6 hours as needed for pain,   Please take the medication as needed.    Swelling: You can expect some swelling around  your  shoulder. Applying  ice to your shoulder will help keep the swelling to a minimum. Ice can be applied by placing ice cubes(preferably crushed) or frozen peas or corn in a plastic bag and putting the bag on your shoulder with a towel between the ice bag and your skin.   The ice should be used periodically during the first 48 hours. The swelling should subside  over the next several days.  Dressing: Fluid leakage is common the first night. Precautions to prevent staining of your clothes, bed sheets, etc., should be taken. This fluid was used to inflate the joint during surgery and is commonly tinged red from a small amount of blood. DO NOT REMOVE your dressing till seen in the office. Some bleeding or leakage from the puncture sites may occur for a few days.  Activity: You will go home with a sling/immobilizer.  Your rotator cuff was repaired and you need to wear the sling  keeping your arm against your side until you come in for follow-up.    Office Check-Up: If no major problems arise and you are progressing well, we will need to see you in the office as noted in your instructions. Please call the office to make an appointment. We will remove your sutures if present and discuss your surgery and rehabilitation at that time. IF PROBLEMS, please call sooner.   Discharge instructions   Complete by: As directed    Arthroscopic Shoulder Surgery Post-OP Instructions:  You have just had an arthroscopic operation. Your incisions (puncture sites) are small and should heal quickly. The structures inside your shoulder  may take 6-8 weeks to heal and settle down.  Pain Medication: You will be given a prescription for pain medication. Please take the medication as needed. Most patients require pain medication for only a few days up to a week.   Swelling: You can expect some swelling around  your  shoulder. Applying  ice to your shoulder will help keep the swelling to a minimum. Ice can be applied by placing ice cubes(preferably crushed) or frozen peas or corn in a plastic bag and putting the bag on your shoulder with a towel between the ice bag and your skin.   The ice should be used periodically during the first 48 hours. The swelling should subside over the next several days.  Dressing: Fluid leakage is common the first night. Precautions to prevent staining  of your clothes, bed sheets, etc., should be taken. This fluid was used to inflate the joint during surgery and is commonly tinged red from a small amount of blood. DO NOT REMOVE your dressing till seen in the office. Some bleeding or leakage from the puncture sites may occur for a few days.  Activity: You will go home with a sling/immobilizer.  Your rotator cuff was repaired and you need to wear the sling  keeping your arm against your side until you come in for follow-up.    Office Check-Up: If no major problems arise and you are progressing well, we will need to see you in the office as noted in your instructions. Please call the office to make an appointment. We will remove your sutures and discuss your surgery and rehabilitation at that time. IF PROBLEMS, please call sooner.   Driving restrictions   Complete by: As directed    No driving for 8 weeks   Driving restrictions   Complete by: As directed    No driving for 6 weeks   Increase activity slowly as tolerated   Complete by: As directed    Increase activity slowly as tolerated   Complete by: As directed    Lifting restrictions   Complete by: As directed    No lifting for 12 weeks   Lifting restrictions   Complete by: As directed    No lifting for 12 weeks   Non weight bearing   Complete by: As directed    Extremity: Upper   Non weight bearing   Complete by: As directed    Extremity: Upper      DISCHARGE MEDICATIONS:   Allergies as of 12/06/2019      Reactions   Motrin [ibuprofen] Hives   Bactrim [sulfamethoxazole-trimethoprim] Rash   Sulfa Antibiotics Hives, Rash      Medication List    STOP taking these medications   HYDROcodone-acetaminophen 7.5-325 MG tablet Commonly known as: NORCO   pregabalin 25 MG capsule Commonly known as: Lyrica     TAKE these medications   acetaminophen 325 MG tablet Commonly known as: TYLENOL Take 1-2 tablets (325-650 mg total) by mouth every 6 (six) hours as needed for mild pain  (pain score 1-3 or temp > 100.5).   amLODipine 10 MG tablet Commonly known as: NORVASC Take 10 mg by mouth daily.   aspirin EC 81 MG tablet Take 81 mg by mouth every 3 (three) days.   atorvastatin 20 MG tablet Commonly known as: LIPITOR Take 20 mg by mouth daily.   baclofen 20 MG tablet Commonly known as: LIORESAL Take 20 mg by mouth 3 (three) times daily.   carvedilol 6.25 MG tablet Commonly known as: COREG Take 6.25 mg by mouth 2 (two) times daily.   clopidogrel 75 MG tablet Commonly known as: PLAVIX Take 1 tablet (75 mg total) by mouth daily. Please schedule annual appt with Dr. Allyson SabalBerry for refills. [phone]. 1st attempt.   esomeprazole 40 MG capsule Commonly known as:  NEXIUM Take 40 mg by mouth daily.   levothyroxine 112 MCG tablet Commonly known as: SYNTHROID Take 112 mcg by mouth daily.   lisinopril 20 MG tablet Commonly known as: ZESTRIL Take 20 mg by mouth daily.   metFORMIN 1000 MG tablet Commonly known as: GLUCOPHAGE Take 1,000 mg by mouth 2 (two) times daily.   multivitamin with minerals Tabs tablet Take 1 tablet by mouth daily.   nitroGLYCERIN 0.4 MG SL tablet Commonly known as: Nitrostat Place 1 tablet (0.4 mg total) under the tongue every 5 (five) minutes as needed for chest pain.   oxyCODONE 5 MG immediate release tablet Commonly known as: Roxicodone Take 1 tablet (5 mg total) by mouth every 6 (six) hours as needed for moderate pain.   ProAir HFA 108 (90 Base) MCG/ACT inhaler Generic drug: albuterol Inhale 2 puffs into the lungs every 6 (six) hours as needed for wheezing or shortness of breath.   traZODone 100 MG tablet Commonly known as: DESYREL Take 100 mg by mouth at bedtime.   [name] FlexTouch 200 UNIT/ML FlexTouch Pen Generic drug: insulin degludec Inject 50 Units as directed daily.            Discharge Care Instructions  (From admission, onward)         Start     Ordered   12/06/19 0000  Non weight bearing     Question:  Extremity  Answer:  Upper   12/06/19 1205   12/05/19 0000  Non weight bearing    Question:  Extremity  Answer:  Upper   12/05/19 1010          FOLLOW UP VISIT:  Next week  DISPOSITION:   Home  CONDITION:  Stable   Arlys John D. Aleda Grana [facility] [phone]  12/06/2019 12:07 PM

## 2019-12-06 NOTE — Progress Notes (Signed)
Patient for D/C, daughter here to provide transportation, concerned about wheezing. O2 sat on the continuous pulse ox reading 94%, encouraged pt to do incentive spirometer, pt complied but is not breathing in very deeply, lungs are diminished at the bases but air movement heard throughout, expiratory wheezes noted in upper lung fields, patient belly breathing but not working to breathe, denies shortness of breath, rest of vitals stable, told daughter to try and limit narcotic use unless necessary and encourage the deep breathing with the incentive spirometer and also coughing. On-call provider paged and in agreement with everything above, also ordered a stat breathing treatment to be given before patient is discharged, respiratory paged and on their way, daughter informed of the order.

## 2019-12-06 NOTE — Progress Notes (Signed)
Inpatient Diabetes Program Recommendations  AACE/ADA: New Consensus Statement on Inpatient Glycemic Control (2015)  Target Ranges:  Prepandial:   less than 140 mg/dL      Peak postprandial:   less than 180 mg/dL (1-2 hours)      Critically ill patients:  140 - 180 mg/dL   Lab Results  Component Value Date   GLUCAP 244 (H) 12/06/2019   HGBA1C 6.9 (H) 12/01/2019    Review of Glycemic Control  Diabetes history: DM2 Outpatient Diabetes medications: Tresiba 50 units QD, metformin 1000 mg bid Current orders for Inpatient glycemic control: Novolog 0-15 units tidwc and hs + 4 units tidwc  HgbA1C - 6.9% - good glycemic control  Inpatient Diabetes Program Recommendations:     Add Lantus 25 units QD (1/2 home dose)  Secure text to RN.  Thank you. Ailene Ards, RD, LDN, CDE Inpatient Diabetes Coordinator [phone]

## 2019-12-06 NOTE — Op Note (Signed)
PATIENT ID: [name]        MRN:  [id number]          DOB/AGE: 10-03-1960 / 59 y.o.    [name], MD   [name], PA-C [address] [place], [place]  [address]                             [phone]   PROGRESS NOTE  Subjective:  negative for Chest Pain  negative for Shortness of Breath  negative for Nausea/Vomiting   negative for Calf Pain    Tolerating Diet: yes         Patient reports pain as mild.     comfortable Objective: Vital signs in last 24 hours:    Patient Vitals for the past 24 hrs:  BP Temp Temp src Pulse Resp SpO2  12/06/19 0558 (!) 154/75 97.6 F (36.4 C) Oral 63 18 98 %  12/06/19 0202 136/72 98 F (36.7 C) Oral 79 20 94 %  12/05/19 2101 (!) 157/85 98.1 F (36.7 C) Oral 86 18 99 %  12/05/19 2030 (!) 157/90 97.7 F (36.5 C) Oral 86 18 --  12/05/19 1927 (!) 150/78 97.8 F (36.6 C) Oral 86 18 98 %  12/05/19 1833 (!) 157/77 98 F (36.7 C) Oral 68 16 94 %  12/05/19 1728 (!) 147/80 98.1 F (36.7 C) -- 67 15 95 %  12/05/19 1700 (!) 167/92 98.1 F (36.7 C) -- 71 14 97 %  12/05/19 1600 138/85 -- -- 70 18 97 %  12/05/19 1500 (!) 146/72 -- -- 66 16 95 %  12/05/19 1400 (!) 154/74 -- -- (!) 58 20 100 %  12/05/19 1300 138/71 -- -- 60 18 96 %  12/05/19 1230 128/73 97.8 F (36.6 C) -- 66 (!) 23 97 %  12/05/19 1215 124/78 -- -- 67 (!) 25 93 %  12/05/19 1200 (!) 143/78 -- -- 68 (!) 21 (!) 89 %  12/05/19 1145 125/69 -- -- (!) 58 (!) 22 92 %  12/05/19 1130 137/73 -- -- 65 (!) 26 92 %  12/05/19 1115 135/72 -- -- (!) 58 (!) 21 91 %  12/05/19 1100 124/60 -- -- 65 15 92 %  12/05/19 1045 119/73 -- -- (!) 59 19 91 %  12/05/19 1030 125/65 -- -- (!) 59 19 93 %  12/05/19 1015 118/85 -- -- 60 18 94 %  12/05/19 1000 120/88 -- -- (!) 57 16 100 %  12/05/19 0949 124/77 98 F (36.7 C) -- 70 20 99 %      Intake/Output from previous day:   05/04 0701 - 05/05 0700 In: 2696 [P.O.:1796; I.V.:800] Out: 2975 [Urine:2900]   Intake/Output this shift:   No  intake/output data recorded.   Intake/Output      05/04 0701 - 05/05 0700 05/05 0701 - 05/06 0700   P.O. 1796    I.V. (mL/kg) 800 (11.4)    IV Piggyback 100    Total Intake(mL/kg) 2696 (38.3)    Urine (mL/kg/hr) 2900 (1.7)    Blood 75    Total Output 2975    Net -279         Urine Occurrence 3 x       LABORATORY DATA: Recent Labs    12/01/19 1143 12/05/19 1826  WBC 7.8 12.9*  HGB 13.4 14.2  HCT 41.9 44.1  PLT 236 219   Recent Labs    12/01/19 1143 12/05/19 1826  NA 142 139  K 3.9 4.3  CL 104 102  CO2 33* 29  BUN 16 15  CREATININE 0.76 0.76  GLUCOSE 87 302*  CALCIUM 9.2 9.4   Lab Results  Component Value Date   INR 1.0 12/01/2019   INR 0.93 05/27/2017    Recent Radiographic Studies :  XR Shoulder Right  Result Date: 11/21/2019 Films of the right shoulder obtained in several projections.  There may be slight elevation of the humeral head and the glenoid.  I think the space between the humeral head and the acromium is normal but probably upper limits of normal.  No ectopic calcification.  There is lateral curvature of the acromion.  There are some degenerative changes of the [employer] joint.  No ectopic calcification.  No obvious arthritis of the glenohumeral joint.  Patient has a history of a rotator cuff tear    Examination:  General appearance: alert, cooperative and no distress  Wound Exam: clean, dry, intact   Drainage:  None: wound tissue dry  Motor Exam: Pinch and Wrist Dorsiflexion Intact  Sensory Exam: Radial, Ulnar and Median normal  Vascular Exam: Normal  Assessment:    1 Day Post-Op  Procedure(s) (LRB): SHOULDER ARTHROSCOPY, SUBACROMIAL DECOMPRESSION AND DISTAL CLAVICLE RESECTION, MINI OPEN ROTATOR CUFF REPAIR (POSSIBLE DERMASPAN PATCH) (Right)  ADDITIONAL DIAGNOSIS:  Active Problems:   Coronary artery disease   Essential hypertension   Hyperlipidemia   Restless leg syndrome   PVD (peripheral vascular disease) (HCC)    Hypoxia     Pl   DISCHARGE PLAN: Home  DISCHARGE NEEDS: sling, ice pack pain meds sent to pharmacy Comfortable this am. Breathing on room air with good sats. Dressing right shoulder clean and dry. Will discharge. Daughter unable to pick up until after work. Office 1 week as scheduled.       Biagio Borg, PA-C [facility]  12/06/2019 7:41 AM

## 2019-12-06 NOTE — Progress Notes (Signed)
Reviewed d/c instructions w/ patient. Verbalized understanding of all instructions. Awaiting ride. Will cont to monitor.

## 2019-12-07 ENCOUNTER — Encounter: Payer: Self-pay | Admitting: *Deleted

## 2019-12-12 ENCOUNTER — Encounter: Payer: Self-pay | Admitting: Orthopaedic Surgery

## 2019-12-12 ENCOUNTER — Other Ambulatory Visit: Payer: Self-pay

## 2019-12-12 ENCOUNTER — Ambulatory Visit (INDEPENDENT_AMBULATORY_CARE_PROVIDER_SITE_OTHER): Payer: Medicaid Other | Admitting: Orthopaedic Surgery

## 2019-12-12 VITALS — Ht 62.0 in | Wt 155.0 lb

## 2019-12-12 DIAGNOSIS — M25511 Pain in right shoulder: Secondary | ICD-10-CM

## 2019-12-12 DIAGNOSIS — Z9889 Other specified postprocedural states: Secondary | ICD-10-CM

## 2019-12-12 DIAGNOSIS — G8929 Other chronic pain: Secondary | ICD-10-CM

## 2019-12-12 MED ORDER — OXYCODONE HCL 5 MG PO CAPS: 5.0000 mg | ORAL_CAPSULE | Freq: Four times a day (QID) | ORAL | 0 refills | Status: DC | PRN

## 2019-12-12 NOTE — Progress Notes (Signed)
Office Visit Note   Patient: [name]           Date of Birth: 02/09/1961           MRN: [id number] Visit Date: 12/12/2019              Requested by: Salli Real, MD [address] [place],  [place] [address] PCP: Salli Real, MD   Assessment & Plan: Visit Diagnoses:  1. S/P right rotator cuff repair   2. Chronic right shoulder pain     Plan: 1 week that is post rotator cuff tear repair of right shoulder with an SCD and DCR.  Also had a biceps tenodesis.  Doing well.  Reapplied new Steri-Strips.  Will start physical therapy for passive motion for 6 weeks.  Office 2 weeks  Follow-Up Instructions: Return in about 2 weeks (around 12/26/2019).   Orders:  Orders Placed This Encounter  Procedures  . Ambulatory referral to Physical Therapy   Meds ordered this encounter  Medications  . oxycodone (OXY-IR) 5 MG capsule    Sig: Take 1 capsule (5 mg total) by mouth every 6 (six) hours as needed.    Dispense:  30 capsule    Refill:  0    Order Specific Question:   Supervising Provider    Answer:   Valeria Batman [[id number]]      Procedures: No procedures performed   Clinical Data: No additional findings.   Subjective: Chief Complaint  Patient presents with  . Right Shoulder - Routine Post Op    Right shoulder scope DOS 12/05/2019  Patient presents today for follow up on her right shoulder. She had a right shoulder arthroscopy on 12/05/2019. She is now one week out from surgery. She states that she is in pain. She is wearing her sling. She is taking oxycodone for pain.   HPI  Review of Systems   Objective: Vital Signs: Ht 5\' 2"  (1.575 m)   Wt 155 lb (70.3 kg)   BMI 28.35 kg/m   Physical Exam  Ortho Exam awake alert and oriented x3.  Comfortable sitting.  Right shoulder incisions healing without problem.  New Steri-Strips applied.  Good grip and good release.  No obvious complications.  Specialty Comments:  No specialty comments available.  Imaging: No results  found.   PMFS History: Patient Active Problem List   Diagnosis Date Noted  . Hypoxia 12/05/2019  . Diabetic neuropathy (HCC) 06/02/2018  . H/O: hysterectomy   . Abdominal wall pain   . Shoulder pain, right   . Callus   . Arthropathy   . Fatigue   . Arterial bruit   . Barrett's esophagus   . Ulcer of esophagus without bleeding   . Right foot pain   . Inflammatory bowel disease   . Precordial pain   . Colon polyp   . Chest pain in adult   . Shortness of breath on exertion   . Restless leg syndrome   . Coronary artery disease, occlusive   . Eosinophilic esophagitis   . Gastroesophagitis   . Gastritis   . H. pylori infection   . Inlet patch of esophagus   . PVD (peripheral vascular disease) (HCC)   . Eosinophilic gastritis   . Chronic back pain   . Abdominal pain, right lower quadrant   . Hypersomnia   . Carotid stenosis, bilateral   . CAD (coronary artery disease) 05/27/2017  . Unstable angina (HCC)   . Coronary artery disease 05/25/2017  .  Essential hypertension 05/25/2017  . Hyperlipidemia 05/25/2017   Past Medical History:  Diagnosis Date  . Abdominal pain, right lower quadrant   . Abdominal wall pain   . Arterial bruit   . Arthritis   . Arthropathy   . Barrett's esophagus   . Callus   . Carotid stenosis, bilateral   . Chest pain in adult   . Chronic back pain   . Colon polyp   . Coronary artery disease, occlusive    stent x 2  . Depression   . Diabetic neuropathy ([place]) 06/02/2018   type 2  . Eosinophilic esophagitis   . Eosinophilic gastritis   . Fatigue   . Gastritis   . Gastroesophagitis   . GERD (gastroesophageal reflux disease)   . H. pylori infection   . H/O: hysterectomy   . Hypersomnia   . IBS (irritable bowel syndrome)   . Inflammatory bowel disease    colitis  . Inlet patch of esophagus   . Myocardial infarction ([place])   . Pneumonia   . Precordial pain   . PVD (peripheral vascular disease) ([place])    pt. denies  . Restless leg  syndrome   . Right foot pain   . Shortness of breath on exertion   . Shoulder pain, right   . Ulcer of esophagus without bleeding     Family History  Problem Relation Age of Onset  . Hyperlipidemia Mother   . Hypertension Mother   . Diabetes Mellitus II Mother   . Cancer Father   . Hypertension Sister   . Hypertension Brother   . Hypertension Brother   . Hypertension Brother   . Hypertension Sister   . Hypertension Sister   . Heart disease Maternal Aunt     Past Surgical History:  Procedure Laterality Date  . AUGMENTATION MAMMAPLASTY Bilateral    breast lift  . BREAST EXCISIONAL BIOPSY Right   . CORONARY STENT INTERVENTION N/A 05/27/2017   Procedure: CORONARY STENT INTERVENTION;  Surgeon: Lorretta Harp, MD;  Location: [facility];  Service: Cardiovascular;  Laterality: N/A;  . EYE SURGERY     laser surgery for hole in eye  . FOOT SURGERY Left   . INTRAVASCULAR PRESSURE WIRE/FFR STUDY N/A 05/27/2017   Procedure: INTRAVASCULAR PRESSURE WIRE/FFR STUDY;  Surgeon: Lorretta Harp, MD;  Location: [facility];  Service: Cardiovascular;  Laterality: N/A;  . LEFT HEART CATH AND CORONARY ANGIOGRAPHY N/A 05/27/2017   Procedure: LEFT HEART CATH AND CORONARY ANGIOGRAPHY;  Surgeon: Lorretta Harp, MD;  Location: [facility];  Service: Cardiovascular;  Laterality: N/A;  . SHOULDER ARTHROSCOPY WITH DISTAL CLAVICLE RESECTION Right 12/05/2019   Procedure: SHOULDER ARTHROSCOPY, SUBACROMIAL DECOMPRESSION AND DISTAL CLAVICLE RESECTION, MINI OPEN ROTATOR CUFF REPAIR (POSSIBLE DERMASPAN PATCH);  Surgeon: Garald Balding, MD;  Location: [facility];  Service: Orthopedics;  Laterality: Right;  . THYROIDECTOMY     Social History   Occupational History  . Occupation: Unemployed  Tobacco Use  . Smoking status: Current Every Day Smoker    Packs/day: 0.50    Years: 25.00    Pack years: 12.50    Types: Cigarettes  . Smokeless tobacco: Never Used  . Tobacco comment: Trying  to stop smoking  Substance and Sexual Activity  . Alcohol use: No  . Drug use: No  . Sexual activity: Not Currently    Birth control/protection: Surgical    Comment: Partial Hyst

## 2019-12-13 ENCOUNTER — Other Ambulatory Visit: Payer: Self-pay | Admitting: Orthopaedic Surgery

## 2019-12-13 ENCOUNTER — Telehealth: Payer: Self-pay | Admitting: Orthopaedic Surgery

## 2019-12-13 MED ORDER — OXYCODONE HCL 5 MG PO TABS: 5.0000 mg | ORAL_TABLET | Freq: Four times a day (QID) | ORAL | 0 refills | Status: DC | PRN

## 2019-12-13 NOTE — Telephone Encounter (Signed)
Please send in script for Oxycodone TABLETS to [facility] on [address]

## 2019-12-13 NOTE — Telephone Encounter (Signed)
sent 

## 2019-12-13 NOTE — Telephone Encounter (Signed)
Patient called requesting Dr. Cleophas Dunker nurse call [facility] on [address] in [place]. Patient states pharmacy is unclear of prescription. Patient states they also said they need authorization. Patient phone number is [phone].

## 2019-12-21 ENCOUNTER — Ambulatory Visit: Payer: Medicaid Other

## 2019-12-27 ENCOUNTER — Ambulatory Visit (INDEPENDENT_AMBULATORY_CARE_PROVIDER_SITE_OTHER): Payer: Medicaid Other | Admitting: Orthopaedic Surgery

## 2019-12-27 ENCOUNTER — Other Ambulatory Visit: Payer: Self-pay

## 2019-12-27 ENCOUNTER — Encounter: Payer: Self-pay | Admitting: Orthopaedic Surgery

## 2019-12-27 VITALS — Ht 62.0 in | Wt 155.0 lb

## 2019-12-27 DIAGNOSIS — M25511 Pain in right shoulder: Secondary | ICD-10-CM

## 2019-12-27 DIAGNOSIS — G8929 Other chronic pain: Secondary | ICD-10-CM

## 2019-12-27 MED ORDER — HYDROCODONE-ACETAMINOPHEN 7.5-325 MG PO TABS: 1.0000 | ORAL_TABLET | Freq: Three times a day (TID) | ORAL | 0 refills | Status: AC | PRN

## 2019-12-27 NOTE — Progress Notes (Signed)
Office Visit Note   Patient: [name]           Date of Birth: 02-02-1961           MRN: [id number] Visit Date: 12/27/2019              Requested by: Sandi Mariscal, [place],  [place] [address] PCP: Sandi Mariscal, MD   Assessment & Plan: Visit Diagnoses:  1. Chronic right shoulder pain     Plan: [name] is 3-week status post right shoulder rotator cuff tear repair, biceps tenodesis SCD and DCR.  Doing well.  Still wearing a sling.  Has not started physical therapy yet because of transportation issues.  Her first session is tomorrow.  She did have adhesive capsulitis noted at the time of surgery so that may be an issue postoperatively.  The other issue is her pain medicine.  She.  She was on hydrocodone 7.5 mg 3 times a day per primary care physician.  I will renew that same medicine and have her stop the oxycodone.  See her back in 3 weeks and discontinue the sling.  Discussed range of motion exercises with her  Follow-Up Instructions: Return in about 3 weeks (around 01/17/2020).   Orders:  No orders of the defined types were placed in this encounter.  No orders of the defined types were placed in this encounter.     Procedures: No procedures performed   Clinical Data: No additional findings.   Subjective: Chief Complaint  Patient presents with  . Right Shoulder - Pain  Patient presents today for follow up on her right shoulder. She had a right shoulder arthroscopy on 12/05/2019. She is now 3 weeks out from surgery. She states that she is starting therapy tomorrow. She states that she is sore today. She is taking oxycodone every 4 hours for pain.   HPI  Review of Systems   Objective: Vital Signs: Ht 5\' 2"  (1.575 m)   Wt 155 lb (70.3 kg)   BMI 28.35 kg/m   Physical Exam  Ortho Exam right shoulder incision healing without any problem.  Steri-Strips are removed.  I did not attempt range of motion as I know that she is stiff and and sore.  Good grip  and good release.  She has been compliant using her sling.  Specialty Comments:  No specialty comments available.  Imaging: No results found.   PMFS History: Patient Active Problem List   Diagnosis Date Noted  . Hypoxia 12/05/2019  . Diabetic neuropathy ([place]) 06/02/2018  . H/O: hysterectomy   . Abdominal wall pain   . Shoulder pain, right   . Callus   . Arthropathy   . Fatigue   . Arterial bruit   . Barrett's esophagus   . Ulcer of esophagus without bleeding   . Right foot pain   . Inflammatory bowel disease   . Precordial pain   . Colon polyp   . Chest pain in adult   . Shortness of breath on exertion   . Restless leg syndrome   . Coronary artery disease, occlusive   . Eosinophilic esophagitis   . Gastroesophagitis   . Gastritis   . H. pylori infection   . Inlet patch of esophagus   . PVD (peripheral vascular disease) ([place])   . Eosinophilic gastritis   . Chronic back pain   . Abdominal pain, right lower quadrant   . Hypersomnia   . Carotid stenosis, bilateral   . CAD (  coronary artery disease) 05/27/2017  . Unstable angina (HCC)   . Coronary artery disease 05/25/2017  . Essential hypertension 05/25/2017  . Hyperlipidemia 05/25/2017   Past Medical History:  Diagnosis Date  . Abdominal pain, right lower quadrant   . Abdominal wall pain   . Arterial bruit   . Arthritis   . Arthropathy   . Barrett's esophagus   . Callus   . Carotid stenosis, bilateral   . Chest pain in adult   . Chronic back pain   . Colon polyp   . Coronary artery disease, occlusive    stent x 2  . Depression   . Diabetic neuropathy (HCC) 06/02/2018   type 2  . Eosinophilic esophagitis   . Eosinophilic gastritis   . Fatigue   . Gastritis   . Gastroesophagitis   . GERD (gastroesophageal reflux disease)   . H. pylori infection   . H/O: hysterectomy   . Hypersomnia   . IBS (irritable bowel syndrome)   . Inflammatory bowel disease    colitis  . Inlet patch of esophagus   .  Myocardial infarction (HCC)   . Pneumonia   . Precordial pain   . PVD (peripheral vascular disease) (HCC)    pt. denies  . Restless leg syndrome   . Right foot pain   . Shortness of breath on exertion   . Shoulder pain, right   . Ulcer of esophagus without bleeding     Family History  Problem Relation Age of Onset  . Hyperlipidemia Mother   . Hypertension Mother   . Diabetes Mellitus II Mother   . Cancer Father   . Hypertension Sister   . Hypertension Brother   . Hypertension Brother   . Hypertension Brother   . Hypertension Sister   . Hypertension Sister   . Heart disease Maternal Aunt     Past Surgical History:  Procedure Laterality Date  . AUGMENTATION MAMMAPLASTY Bilateral    breast lift  . BREAST EXCISIONAL BIOPSY Right   . CORONARY STENT INTERVENTION N/A 05/27/2017   Procedure: CORONARY STENT INTERVENTION;  Surgeon: Runell Gess, MD;  [facility];  Service: Cardiovascular;  Laterality: N/A;  . EYE SURGERY     laser surgery for hole in eye  . FOOT SURGERY Left   . INTRAVASCULAR PRESSURE WIRE/FFR STUDY N/A 05/27/2017   Procedure: INTRAVASCULAR PRESSURE WIRE/FFR STUDY;  Surgeon: Runell Gess, MD;  Location: [facility];  Service: Cardiovascular;  Laterality: N/A;  . LEFT HEART CATH AND CORONARY ANGIOGRAPHY N/A 05/27/2017   Procedure: LEFT HEART CATH AND CORONARY ANGIOGRAPHY;  Surgeon: Runell Gess, MD;  [facility];  Service: Cardiovascular;  Laterality: N/A;  . SHOULDER ARTHROSCOPY WITH DISTAL CLAVICLE RESECTION Right 12/05/2019   Procedure: SHOULDER ARTHROSCOPY, SUBACROMIAL DECOMPRESSION AND DISTAL CLAVICLE RESECTION, MINI OPEN ROTATOR CUFF REPAIR (POSSIBLE DERMASPAN PATCH);  Surgeon: Valeria Batman, MD;  Location: [facility];  Service: Orthopedics;  Laterality: Right;  . THYROIDECTOMY     Social History   Occupational History  . Occupation: Unemployed  Tobacco Use  . Smoking status: Current Every Day Smoker     Packs/day: 0.50    Years: 25.00    Pack years: 12.50    Types: Cigarettes  . Smokeless tobacco: Never Used  . Tobacco comment: Trying to stop smoking  Substance and Sexual Activity  . Alcohol use: No  . Drug use: No  . Sexual activity: Not Currently    Birth  control/protection: Surgical    Comment: Partial Hyst

## 2019-12-28 ENCOUNTER — Encounter: Payer: Self-pay | Admitting: Physical Therapy

## 2019-12-28 ENCOUNTER — Ambulatory Visit: Payer: Medicaid Other | Admitting: Physical Therapy

## 2019-12-28 DIAGNOSIS — Z9889 Other specified postprocedural states: Secondary | ICD-10-CM

## 2019-12-28 DIAGNOSIS — M25511 Pain in right shoulder: Secondary | ICD-10-CM

## 2019-12-28 DIAGNOSIS — G8929 Other chronic pain: Secondary | ICD-10-CM

## 2019-12-28 DIAGNOSIS — M25611 Stiffness of right shoulder, not elsewhere classified: Secondary | ICD-10-CM

## 2019-12-28 DIAGNOSIS — M6281 Muscle weakness (generalized): Secondary | ICD-10-CM

## 2019-12-29 ENCOUNTER — Encounter: Payer: Self-pay | Admitting: Physical Therapy

## 2019-12-29 NOTE — Patient Instructions (Signed)
Reviewed pendulums

## 2019-12-29 NOTE — Therapy (Signed)
[facility] [address] [place], [place], [address] Phone: [phone]   Fax:  [phone]  Physical Therapy Evaluation  Patient Details  Name: [name] MRN: [id number] Date of Birth: 1961-04-07 Referring Provider (PT): Lavera Guise    Encounter Date: 12/28/2019  PT End of Session - 12/28/19 1515    Visit Number  1    Number of Visits  4    Date for PT Re-Evaluation  01/25/20    Authorization Type  Medicaid    PT Start Time  1503    PT Stop Time  1545    PT Time Calculation (min)  42 min    Activity Tolerance  Patient limited by pain       Past Medical History:  Diagnosis Date  . Abdominal pain, right lower quadrant   . Abdominal wall pain   . Arterial bruit   . Arthritis   . Arthropathy   . Barrett's esophagus   . Callus   . Carotid stenosis, bilateral   . Chest pain in adult   . Chronic back pain   . Colon polyp   . Coronary artery disease, occlusive    stent x 2  . Depression   . Diabetic neuropathy (HCC) 06/02/2018   type 2  . Eosinophilic esophagitis   . Eosinophilic gastritis   . Fatigue   . Gastritis   . Gastroesophagitis   . GERD (gastroesophageal reflux disease)   . H. pylori infection   . H/O: hysterectomy   . Hypersomnia   . IBS (irritable bowel syndrome)   . Inflammatory bowel disease    colitis  . Inlet patch of esophagus   . Myocardial infarction (HCC)   . Pneumonia   . Precordial pain   . PVD (peripheral vascular disease) (HCC)    pt. denies  . Restless leg syndrome   . Right foot pain   . Shortness of breath on exertion   . Shoulder pain, right   . Ulcer of esophagus without bleeding     Past Surgical History:  Procedure Laterality Date  . AUGMENTATION MAMMAPLASTY Bilateral    breast lift  . BREAST EXCISIONAL BIOPSY Right   . CORONARY STENT INTERVENTION N/A 05/27/2017   Procedure: CORONARY STENT INTERVENTION;  Surgeon: Runell Gess, MD;  [facility];  Service: Cardiovascular;  Laterality: N/A;  . EYE SURGERY     laser surgery for hole in eye  . FOOT SURGERY Left   . INTRAVASCULAR PRESSURE WIRE/FFR STUDY N/A 05/27/2017   Procedure: INTRAVASCULAR PRESSURE WIRE/FFR STUDY;  Surgeon: Runell Gess, MD;  [facility];  Service: Cardiovascular;  Laterality: N/A;  . LEFT HEART CATH AND CORONARY ANGIOGRAPHY N/A 05/27/2017   Procedure: LEFT HEART CATH AND CORONARY ANGIOGRAPHY;  Surgeon: Runell Gess, MD;  [facility];  Service: Cardiovascular;  Laterality: N/A;  . SHOULDER ARTHROSCOPY WITH DISTAL CLAVICLE RESECTION Right 12/05/2019   Procedure: SHOULDER ARTHROSCOPY, SUBACROMIAL DECOMPRESSION AND DISTAL CLAVICLE RESECTION, MINI OPEN ROTATOR CUFF REPAIR (POSSIBLE DERMASPAN PATCH);  Surgeon: Valeria Batman, MD;  Location: [facility];  Service: Orthopedics;  Laterality: Right;  . THYROIDECTOMY      There were no vitals filed for this visit.   Subjective Assessment - 12/28/19 1512    Subjective  Patient had an RTC repair and DCE 12/05/2019. Prior to the surgery she had a 2year historyu of shoulder pain and limited shoulder movement. At this time she is  still in the sling. her pain is well controlled. She does have some pain at night.    Limitations  Lifting;House hold activities    Diagnostic tests  X-ray, MRI    Patient Stated Goals  Get rid of pain and move shoulder better    Currently in Pain?  Yes    Pain Location  Shoulder    Pain Orientation  Right    Pain Descriptors / Indicators  Aching    Pain Type  Chronic pain    Pain Onset  More than a month ago    Pain Frequency  Constant    Aggravating Factors   use of the shoulder; sleeping at night    Pain Relieving Factors  pain medication    Effect of Pain on Daily Activities  difficulty using her right leg for activity         [facility] PT Assessment - 12/29/19 0001      Assessment   Medical Diagnosis  Right RTC repair DCE/ and SAD     Referring  Provider (PT)  Collene Leyden     Next MD Visit  12/24/3019    Prior Therapy  yes: had 1 therapy visit before her surgery       Precautions   Precautions  Shoulder    Precaution Comments  in a sling; follow SOS protocol       Restrictions   Weight Bearing Restrictions  No      Balance Screen   Has the patient fallen in the past 6 months  Yes    How many times?  1    Has the patient had a decrease in activity level because of a fear of falling?   No    Is the patient reluctant to leave their home because of a fear of falling?   No      Home Film/video editor residence    Living Arrangements  Children   daughter   Available Help at Discharge  Family    Type of [facility] to enter    Entrance Stairs-Number of Steps  1 flight of stairs    Home Layout  Two level    Alternate Level Stairs-Number of Steps  1 flight up to room      Prior Function   Level of Independence  Independent with basic ADLs    Vocation  On disability    Leisure  Watching TV      Cognition   Overall Cognitive Status  Within Functional Limits for tasks assessed    Attention  Focused    Focused Attention  Appears intact    Memory  Appears intact    Awareness  Appears intact    Problem Solving  Appears intact      Observation/Other Assessments   Focus on Therapeutic Outcomes (FOTO)   NA - MCD      Sensation   Light Touch  Appears Intact    Additional Comments  denies parathesias       Coordination   Gross Motor Movements are Fluid and Coordinated  Yes    Fine Motor Movements are Fluid and Coordinated  Yes      Posture/Postural Control   Posture Comments  Patient exhibits rounded shoulder and forward head posture      PROM   Right Shoulder Flexion  45 Degrees    Right Shoulder Internal Rotation  48 Degrees  Right Shoulder External Rotation  15 Degrees      Palpation   Palpation comment  no unexpected ttp. Mild tightness in upper trap                    Objective measurements completed on examination: See above findings.              PT Education - 12/28/19 1515    Education Details  POC going forward. Improtance of letting her shoullder heal    Person(s) Educated  Patient    Methods  Demonstration;Explanation;Tactile cues;Verbal cues    Comprehension  Verbalized understanding;Returned demonstration;Verbal cues required;Tactile cues required       PT Short Term Goals - 12/28/19 1623      PT SHORT TERM GOAL #1   Title  Patient will increase passive shoulder ER to 30 degrees    Time  2    Period  Weeks    Status  New    Target Date  01/11/20      PT SHORT TERM GOAL #2   Title  Patient will increase passvie right shoulder flexion to 90 degres    Baseline  45    Time  3    Period  Weeks    Status  New    Target Date  01/18/20      PT SHORT TERM GOAL #3   Title  Patient will wean out of sling per MD instructions    Baseline  still in the sling for 2-3 more weeks    Time  3    Period  Weeks    Status  New    Target Date  01/18/20        PT Long Term Goals - 12/29/19 1244      PT LONG TERM GOAL #1   Title  Patient will reach behind her head without pain    Baseline  can not reach    Time  8    Period  Weeks    Status  New    Target Date  02/23/20      PT LONG TERM GOAL #2   Title  Patient will reach to an overhsad shelf with a 2 lb weight without pain    Baseline  can not reach    Time  8    Period  Weeks    Status  New    Target Date  02/23/20             Plan - 12/28/19 1525    Clinical Impression Statement  Patient is a 59 year old female S/P right RTC repair on 12/05/2019 and DEC/SAD. She presents with expected limitations in motion and strength. Her pain is well controlled at this time. She would benefit from skilled therapy to improve strength, ROM and functional use of her dominant arm.    Personal Factors and Comorbidities  Fitness;Past/Current Experience;Social  Background;Time since onset of injury/illness/exacerbation;Transportation    Examination-Activity Limitations  Bathing;Carry;Reach Overhead;Sleep;Lift;Hygiene/Grooming;Dressing    Examination-Participation Restrictions  Meal Prep;Cleaning;Community Activity;Driving;Shop;Laundry;Yard Work    Stability/Clinical Decision Making  Stable/Uncomplicated    Optometrist  Low    Rehab Potential  Good    PT Frequency  1x / week   per mediciad guidlines   PT Duration  3 weeks    PT Treatment/Interventions  ADLs/Self Care Home Management;Cryotherapy;Electrical Stimulation;Moist Heat;Neuromuscular re-education;Therapeutic exercise;Therapeutic activities;Functional mobility training;Patient/family education;Manual techniques;Dry needling;Passive range of motion;Scar mobilization;Taping;Vasopneumatic Device;Joint Manipulations;Spinal Manipulations;Iontophoresis 4mg /ml Dexamethasone;Ultrasound  PT Next Visit Plan  progress per SOS protocol in book unless otherwise specified by MD    PT Home Exercise Plan  pendulums; passive elbow flexion/extension' wrist flexion extnesion    Consulted and Agree with Plan of Care  Patient       Patient will benefit from skilled therapeutic intervention in order to improve the following deficits and impairments:  Decreased range of motion, Impaired UE functional use, Decreased activity tolerance, Pain, Impaired flexibility, Hypomobility, Decreased strength, Postural dysfunction, Decreased safety awareness  Visit Diagnosis: Chronic right shoulder pain  Muscle weakness (generalized)  Stiffness of right shoulder, not elsewhere classified  S/P right rotator cuff repair     Problem List Patient Active Problem List   Diagnosis Date Noted  . Hypoxia 12/05/2019  . Diabetic neuropathy (HCC) 06/02/2018  . H/O: hysterectomy   . Abdominal wall pain   . Shoulder pain, right   . Callus   . Arthropathy   . Fatigue   . Arterial bruit   . Barrett's esophagus    . Ulcer of esophagus without bleeding   . Right foot pain   . Inflammatory bowel disease   . Precordial pain   . Colon polyp   . Chest pain in adult   . Shortness of breath on exertion   . Restless leg syndrome   . Coronary artery disease, occlusive   . Eosinophilic esophagitis   . Gastroesophagitis   . Gastritis   . H. pylori infection   . Inlet patch of esophagus   . PVD (peripheral vascular disease) (HCC)   . Eosinophilic gastritis   . Chronic back pain   . Abdominal pain, right lower quadrant   . Hypersomnia   . Carotid stenosis, bilateral   . CAD (coronary artery disease) 05/27/2017  . Unstable angina (HCC)   . Coronary artery disease 05/25/2017  . Essential hypertension 05/25/2017  . Hyperlipidemia 05/25/2017    Dessie Coma PT DPT  12/29/2019, 12:48 PM  [facility] [address] [place], [place], [address] Phone: [phone]   Fax:  [phone]  Name: [name] MRN: [id number] Date of Birth: 03-19-1961

## 2020-01-05 ENCOUNTER — Other Ambulatory Visit: Payer: Self-pay

## 2020-01-05 ENCOUNTER — Ambulatory Visit: Payer: Medicaid Other | Attending: Orthopedic Surgery | Admitting: Physical Therapy

## 2020-01-05 ENCOUNTER — Encounter: Payer: Self-pay | Admitting: Physical Therapy

## 2020-01-05 DIAGNOSIS — G8929 Other chronic pain: Secondary | ICD-10-CM | POA: Insufficient documentation

## 2020-01-05 DIAGNOSIS — M6281 Muscle weakness (generalized): Secondary | ICD-10-CM

## 2020-01-05 DIAGNOSIS — M25511 Pain in right shoulder: Secondary | ICD-10-CM | POA: Insufficient documentation

## 2020-01-05 DIAGNOSIS — M25611 Stiffness of right shoulder, not elsewhere classified: Secondary | ICD-10-CM | POA: Insufficient documentation

## 2020-01-05 DIAGNOSIS — Z9889 Other specified postprocedural states: Secondary | ICD-10-CM | POA: Insufficient documentation

## 2020-01-05 NOTE — Therapy (Signed)
[facility] [address] [place], [place], [address] Phone: [phone]   Fax:  [phone]  Physical Therapy Treatment  Patient Details  Name: [name] MRN: [id number] Date of Birth: 08/16/1960 Referring Provider (PT): Lavera Guise    Encounter Date: 01/05/2020  PT End of Session - 01/05/20 0808    Visit Number  2    Number of Visits  4    Date for PT Re-Evaluation  01/25/20    Authorization Type  Medicaid- auth not returned on 6/4    PT Start Time  0803    PT Stop Time  0845    PT Time Calculation (min)  42 min    Activity Tolerance  Patient tolerated treatment well    Behavior During Therapy  [facility] for tasks assessed/performed       Past Medical History:  Diagnosis Date  . Abdominal pain, right lower quadrant   . Abdominal wall pain   . Arterial bruit   . Arthritis   . Arthropathy   . Barrett's esophagus   . Callus   . Carotid stenosis, bilateral   . Chest pain in adult   . Chronic back pain   . Colon polyp   . Coronary artery disease, occlusive    stent x 2  . Depression   . Diabetic neuropathy (HCC) 06/02/2018   type 2  . Eosinophilic esophagitis   . Eosinophilic gastritis   . Fatigue   . Gastritis   . Gastroesophagitis   . GERD (gastroesophageal reflux disease)   . H. pylori infection   . H/O: hysterectomy   . Hypersomnia   . IBS (irritable bowel syndrome)   . Inflammatory bowel disease    colitis  . Inlet patch of esophagus   . Myocardial infarction (HCC)   . Pneumonia   . Precordial pain   . PVD (peripheral vascular disease) (HCC)    pt. denies  . Restless leg syndrome   . Right foot pain   . Shortness of breath on exertion   . Shoulder pain, right   . Ulcer of esophagus without bleeding     Past Surgical History:  Procedure Laterality Date  . AUGMENTATION MAMMAPLASTY Bilateral    breast lift  . BREAST EXCISIONAL BIOPSY Right   . CORONARY STENT INTERVENTION N/A 05/27/2017    Procedure: CORONARY STENT INTERVENTION;  Surgeon: Runell Gess, MD;  [facility]: [facility];  Service: Cardiovascular;  Laterality: N/A;  . EYE SURGERY     laser surgery for hole in eye  . FOOT SURGERY Left   . INTRAVASCULAR PRESSURE WIRE/FFR STUDY N/A 05/27/2017   Procedure: INTRAVASCULAR PRESSURE WIRE/FFR STUDY;  Surgeon: Runell Gess, MD;  Location: [facility];  Service: Cardiovascular;  Laterality: N/A;  . LEFT HEART CATH AND CORONARY ANGIOGRAPHY N/A 05/27/2017   Procedure: LEFT HEART CATH AND CORONARY ANGIOGRAPHY;  Surgeon: Runell Gess, MD;  Location: [facility];  Service: Cardiovascular;  Laterality: N/A;  . SHOULDER ARTHROSCOPY WITH DISTAL CLAVICLE RESECTION Right 12/05/2019   Procedure: SHOULDER ARTHROSCOPY, SUBACROMIAL DECOMPRESSION AND DISTAL CLAVICLE RESECTION, MINI OPEN ROTATOR CUFF REPAIR (POSSIBLE DERMASPAN PATCH);  Surgeon: Valeria Batman, MD;  Location: [facility];  Service: Orthopedics;  Laterality: Right;  . THYROIDECTOMY      There were no vitals filed for this visit.  Subjective Assessment - 01/05/20 0805    Subjective  A little burning and pulling. neck is doing well and denies HA. no N/T  in hand.    Patient Stated Goals  Get rid of pain and move shoulder better    Currently in Pain?  Yes    Pain Score  8     Pain Location  Shoulder    Pain Orientation  Right    Pain Descriptors / Indicators  Burning   pulling                       [facility] Adult PT Treatment/Exercise - 01/05/20 0001      Manual Therapy   Manual Therapy  Soft tissue mobilization;Passive ROM    Soft tissue mobilization  Rt upper trap & pectoralis    Passive ROM  Rt GHJ within protocol limitations               PT Short Term Goals - 12/28/19 1623      PT SHORT TERM GOAL #1   Title  Patient will increase passive shoulder ER to 30 degrees    Time  2    Period  Weeks    Status  New    Target Date  01/11/20      PT SHORT TERM GOAL #2    Title  Patient will increase passvie right shoulder flexion to 90 degres    Baseline  45    Time  3    Period  Weeks    Status  New    Target Date  01/18/20      PT SHORT TERM GOAL #3   Title  Patient will wean out of sling per MD instructions    Baseline  still in the sling for 2-3 more weeks    Time  3    Period  Weeks    Status  New    Target Date  01/18/20        PT Long Term Goals - 12/29/19 1244      PT LONG TERM GOAL #1   Title  Patient will reach behind her head without pain    Baseline  can not reach    Time  8    Period  Weeks    Status  New    Target Date  02/23/20      PT LONG TERM GOAL #2   Title  Patient will reach to an overhsad shelf with a 2 lb weight without pain    Baseline  can not reach    Time  8    Period  Weeks    Status  New    Target Date  02/23/20            Plan - 01/05/20 0833    Clinical Impression Statement  Flexion comfortably to approx 80 deg and abd comfortably to approx 70. Pt reported pulling at end range.    PT Treatment/Interventions  ADLs/Self Care Home Management;Cryotherapy;Electrical Stimulation;Moist Heat;Neuromuscular re-education;Therapeutic exercise;Therapeutic activities;Functional mobility training;Patient/family education;Manual techniques;Dry needling;Passive range of motion;Scar mobilization;Taping;Vasopneumatic Device;Joint Manipulations;Spinal Manipulations;Iontophoresis 4mg /ml Dexamethasone;Ultrasound    PT Next Visit Plan  progress per SOS protocol in book unless otherwise specified by MD    PT Home Exercise Plan  pendulums; passive elbow flexion/extension' wrist flexion extension    Consulted and Agree with Plan of Care  Patient       Patient will benefit from skilled therapeutic intervention in order to improve the following deficits and impairments:  Decreased range of motion, Impaired UE functional use, Decreased activity tolerance, Pain, Impaired flexibility, Hypomobility, Decreased strength, Postural  dysfunction, Decreased safety awareness  Visit Diagnosis: Chronic right shoulder pain  Muscle weakness (generalized)  Stiffness of right shoulder, not elsewhere classified  S/P right rotator cuff repair     Problem List Patient Active Problem List   Diagnosis Date Noted  . Hypoxia 12/05/2019  . Diabetic neuropathy (HCC) 06/02/2018  . H/O: hysterectomy   . Abdominal wall pain   . Shoulder pain, right   . Callus   . Arthropathy   . Fatigue   . Arterial bruit   . Barrett's esophagus   . Ulcer of esophagus without bleeding   . Right foot pain   . Inflammatory bowel disease   . Precordial pain   . Colon polyp   . Chest pain in adult   . Shortness of breath on exertion   . Restless leg syndrome   . Coronary artery disease, occlusive   . Eosinophilic esophagitis   . Gastroesophagitis   . Gastritis   . H. pylori infection   . Inlet patch of esophagus   . PVD (peripheral vascular disease) (HCC)   . Eosinophilic gastritis   . Chronic back pain   . Abdominal pain, right lower quadrant   . Hypersomnia   . Carotid stenosis, bilateral   . CAD (coronary artery disease) 05/27/2017  . Unstable angina (HCC)   . Coronary artery disease 05/25/2017  . Essential hypertension 05/25/2017  . Hyperlipidemia 05/25/2017  Crysten Kaman C. Damascus Feldpausch PT, DPT 01/05/20 8:37 AM   [facility] [address] [place], [place], [address] Phone: [phone]   Fax:  [phone]  Name: [name] MRN: [id number] Date of Birth: [birth year]

## 2020-01-10 ENCOUNTER — Other Ambulatory Visit: Payer: Self-pay

## 2020-01-10 ENCOUNTER — Ambulatory Visit: Payer: Medicaid Other | Admitting: Physical Therapy

## 2020-01-10 DIAGNOSIS — M25511 Pain in right shoulder: Secondary | ICD-10-CM | POA: Diagnosis not present

## 2020-01-10 DIAGNOSIS — M25611 Stiffness of right shoulder, not elsewhere classified: Secondary | ICD-10-CM

## 2020-01-10 DIAGNOSIS — M6281 Muscle weakness (generalized): Secondary | ICD-10-CM | POA: Diagnosis present

## 2020-01-10 DIAGNOSIS — Z9889 Other specified postprocedural states: Secondary | ICD-10-CM | POA: Diagnosis present

## 2020-01-10 DIAGNOSIS — G8929 Other chronic pain: Secondary | ICD-10-CM

## 2020-01-10 NOTE — Therapy (Signed)
[facility] [address] [place], [place], [address] Phone: [phone]   Fax:  [phone]  Physical Therapy Treatment  Patient Details  Name: [name] MRN: [id number] Date of Birth: [birth year] Referring Provider (PT): Lavera Guise    Encounter Date: 01/10/2020  PT End of Session - 01/10/20 0914    Visit Number  3    Number of Visits  4    Date for PT Re-Evaluation  01/25/20    Authorization Type  Medicaid    Authorization Time Period  6/4 to 6/24    Authorization - Visit Number  2    Authorization - Number of Visits  3    PT Start Time  0835    PT Stop Time  0915    PT Time Calculation (min)  40 min    Activity Tolerance  Patient tolerated treatment well    Behavior During Therapy  [facility] for tasks assessed/performed       Past Medical History:  Diagnosis Date  . Abdominal pain, right lower quadrant   . Abdominal wall pain   . Arterial bruit   . Arthritis   . Arthropathy   . Barrett's esophagus   . Callus   . Carotid stenosis, bilateral   . Chest pain in adult   . Chronic back pain   . Colon polyp   . Coronary artery disease, occlusive    stent x 2  . Depression   . Diabetic neuropathy (HCC) 06/02/2018   type 2  . Eosinophilic esophagitis   . Eosinophilic gastritis   . Fatigue   . Gastritis   . Gastroesophagitis   . GERD (gastroesophageal reflux disease)   . H. pylori infection   . H/O: hysterectomy   . Hypersomnia   . IBS (irritable bowel syndrome)   . Inflammatory bowel disease    colitis  . Inlet patch of esophagus   . Myocardial infarction (HCC)   . Pneumonia   . Precordial pain   . PVD (peripheral vascular disease) (HCC)    pt. denies  . Restless leg syndrome   . Right foot pain   . Shortness of breath on exertion   . Shoulder pain, right   . Ulcer of esophagus without bleeding     Past Surgical History:  Procedure Laterality Date  . AUGMENTATION MAMMAPLASTY Bilateral    breast lift   . BREAST EXCISIONAL BIOPSY Right   . CORONARY STENT INTERVENTION N/A 05/27/2017   Procedure: CORONARY STENT INTERVENTION;  Surgeon: Runell Gess, MD;  Location: [facility];  Service: Cardiovascular;  Laterality: N/A;  . EYE SURGERY     laser surgery for hole in eye  . FOOT SURGERY Left   . INTRAVASCULAR PRESSURE WIRE/FFR STUDY N/A 05/27/2017   Procedure: INTRAVASCULAR PRESSURE WIRE/FFR STUDY;  Surgeon: Runell Gess, MD;  Location: [facility];  Service: Cardiovascular;  Laterality: N/A;  . LEFT HEART CATH AND CORONARY ANGIOGRAPHY N/A 05/27/2017   Procedure: LEFT HEART CATH AND CORONARY ANGIOGRAPHY;  Surgeon: Runell Gess, MD;  Location: [facility];  Service: Cardiovascular;  Laterality: N/A;  . SHOULDER ARTHROSCOPY WITH DISTAL CLAVICLE RESECTION Right 12/05/2019   Procedure: SHOULDER ARTHROSCOPY, SUBACROMIAL DECOMPRESSION AND DISTAL CLAVICLE RESECTION, MINI OPEN ROTATOR CUFF REPAIR (POSSIBLE DERMASPAN PATCH);  Surgeon: Valeria Batman, MD;  Location: [facility];  Service: Orthopedics;  Laterality: Right;  . THYROIDECTOMY      There were no vitals filed for this visit.  Subjective Assessment - 01/10/20 0912    Subjective  Pt reports no issues with the shoulder. Pt notes that burning and pulling has calmed down. Pt reports aching ever since she got her shoulder wet in the rain. Pt states she is on Week 3 of therapy due to a few missed weeks because of transportation issues.    Limitations  Lifting;House hold activities    Diagnostic tests  X-ray, MRI    Patient Stated Goals  Get rid of pain and move shoulder better    Currently in Pain?  No/denies    Pain Score  6     Pain Location  Shoulder    Pain Descriptors / Indicators  Aching    Pain Type  Chronic pain    Pain Onset  More than a month ago                        [facility] Adult PT Treatment/Exercise - 01/10/20 0001      Exercises   Exercises  Shoulder      Shoulder Exercises:  Isometric Strengthening   Flexion  3X3"    Extension  3X3"    External Rotation  3X3"    Internal Rotation  3X3"    ABduction  3X3"      Modalities   Modalities  Vasopneumatic      Vasopneumatic   Number Minutes Vasopneumatic   10 minutes    Vasopnuematic Location   Shoulder    Vasopneumatic Pressure  Medium    Vasopneumatic Temperature   34      Manual Therapy   Manual Therapy  Soft tissue mobilization;Passive ROM   Per Week 3 protocol   Soft tissue mobilization  Rt upper trap & pectoralis    Passive ROM  Rt GHJ within protocol limitations             PT Education - 01/10/20 0913    Education Details  Discussed progressing protocol.    Person(s) Educated  Patient    Methods  Explanation;Demonstration;Handout;Verbal cues    Comprehension  Verbalized understanding;Returned demonstration;Verbal cues required;Tactile cues required       PT Short Term Goals - 12/28/19 1623      PT SHORT TERM GOAL #1   Title  Patient will increase passive shoulder ER to 30 degrees    Time  2    Period  Weeks    Status  New    Target Date  01/11/20      PT SHORT TERM GOAL #2   Title  Patient will increase passvie right shoulder flexion to 90 degres    Baseline  45    Time  3    Period  Weeks    Status  New    Target Date  01/18/20      PT SHORT TERM GOAL #3   Title  Patient will wean out of sling per MD instructions    Baseline  still in the sling for 2-3 more weeks    Time  3    Period  Weeks    Status  New    Target Date  01/18/20        PT Long Term Goals - 12/29/19 1244      PT LONG TERM GOAL #1   Title  Patient will reach behind her head without pain    Baseline  can not reach    Time  8    Period  Weeks  Status  New    Target Date  02/23/20      PT LONG TERM GOAL #2   Title  Patient will reach to an overhsad shelf with a 2 lb weight without pain    Baseline  can not reach    Time  8    Period  Weeks    Status  New    Target Date  02/23/20             Plan - 01/10/20 0915    Clinical Impression Statement  Due to a few missed weeks, discussed progressing pt to Week 3 of protocol. Flexion to ~80 deg, abd to ~70 deg per protocol, ER ~40 deg, extension ~20 deg. Began protected strengthening via isometrics.    Personal Factors and Comorbidities  Fitness;Past/Current Experience;Social Background;Time since onset of injury/illness/exacerbation;Transportation    Examination-Activity Limitations  Bathing;Carry;Reach Overhead;Sleep;Lift;Hygiene/Grooming;Dressing    Examination-Participation Restrictions  Meal Prep;Cleaning;Community Activity;Driving;Shop;Laundry;Yard Work    Stability/Clinical Decision Making  Stable/Uncomplicated    Clinical Decision Making  Low    Rehab Potential  Good    PT Frequency  1x / week    PT Duration  3 weeks    PT Treatment/Interventions  ADLs/Self Care Home Management;Cryotherapy;Electrical Stimulation;Moist Heat;Neuromuscular re-education;Therapeutic exercise;Therapeutic activities;Functional mobility training;Patient/family education;Manual techniques;Dry needling;Passive range of motion;Scar mobilization;Taping;Vasopneumatic Device;Joint Manipulations;Spinal Manipulations;Iontophoresis 4mg /ml Dexamethasone;Ultrasound    PT Next Visit Plan  progress per SOS protocol in book unless otherwise specified by MD    PT Home Exercise Plan  pendulums; passive elbow flexion/extension' wrist flexion extension; isometrics    Consulted and Agree with Plan of Care  Patient       Patient will benefit from skilled therapeutic intervention in order to improve the following deficits and impairments:  Decreased range of motion, Impaired UE functional use, Decreased activity tolerance, Pain, Impaired flexibility, Hypomobility, Decreased strength, Postural dysfunction, Decreased safety awareness  Visit Diagnosis: Chronic right shoulder pain  Muscle weakness (generalized)  Stiffness of right shoulder, not elsewhere  classified  S/P right rotator cuff repair     Problem List Patient Active Problem List   Diagnosis Date Noted  . Hypoxia 12/05/2019  . Diabetic neuropathy (HCC) 06/02/2018  . H/O: hysterectomy   . Abdominal wall pain   . Shoulder pain, right   . Callus   . Arthropathy   . Fatigue   . Arterial bruit   . Barrett's esophagus   . Ulcer of esophagus without bleeding   . Right foot pain   . Inflammatory bowel disease   . Precordial pain   . Colon polyp   . Chest pain in adult   . Shortness of breath on exertion   . Restless leg syndrome   . Coronary artery disease, occlusive   . Eosinophilic esophagitis   . Gastroesophagitis   . Gastritis   . H. pylori infection   . Inlet patch of esophagus   . PVD (peripheral vascular disease) (HCC)   . Eosinophilic gastritis   . Chronic back pain   . Abdominal pain, right lower quadrant   . Hypersomnia   . Carotid stenosis, bilateral   . CAD (coronary artery disease) 05/27/2017  . Unstable angina (HCC)   . Coronary artery disease 05/25/2017  . Essential hypertension 05/25/2017  . Hyperlipidemia 05/25/2017    Jade Burright April Ma L Amadea Keagy PT, DPT 01/10/2020, 9:32 AM  [facility] [address] [place], [place], [place] Phone: [phone]   Fax:  [phone]  Name: [name]  MRN: [id number] Date of Birth: 17-Apr-1961

## 2020-01-15 ENCOUNTER — Ambulatory Visit: Payer: Medicaid Other | Admitting: Physical Therapy

## 2020-01-22 ENCOUNTER — Other Ambulatory Visit: Payer: Self-pay

## 2020-01-22 ENCOUNTER — Ambulatory Visit: Payer: Medicaid Other | Admitting: Physical Therapy

## 2020-01-22 DIAGNOSIS — M6281 Muscle weakness (generalized): Secondary | ICD-10-CM

## 2020-01-22 DIAGNOSIS — M25611 Stiffness of right shoulder, not elsewhere classified: Secondary | ICD-10-CM

## 2020-01-22 DIAGNOSIS — G8929 Other chronic pain: Secondary | ICD-10-CM

## 2020-01-22 DIAGNOSIS — M25511 Pain in right shoulder: Secondary | ICD-10-CM | POA: Diagnosis not present

## 2020-01-22 DIAGNOSIS — Z9889 Other specified postprocedural states: Secondary | ICD-10-CM

## 2020-01-22 NOTE — Therapy (Signed)
[facility] Outpatient Rehabilitation [facility] [address] [place], [place], [address] Phone: [phone]   Fax:  [phone]  Physical Therapy Treatment/Re-Certification  Patient Details  Name: [name] MRN: [id number] Date of Birth: [birth year] Referring Provider (PT): Lavera Guise    Encounter Date: 01/22/2020   PT End of Session - 01/22/20 0908    Visit Number 4    Number of Visits 4    Date for PT Re-Evaluation 01/25/20    Authorization Type Medicaid - re-auth requested for 12 more visits starting next treatment    Authorization - Visit Number 3    Authorization - Number of Visits 3    PT Start Time [phone]    PT Stop Time 0915    PT Time Calculation (min) 43 min    Activity Tolerance Patient tolerated treatment well    Behavior During Therapy [facility] for tasks assessed/performed           Past Medical History:  Diagnosis Date  . Abdominal pain, right lower quadrant   . Abdominal wall pain   . Arterial bruit   . Arthritis   . Arthropathy   . Barrett's esophagus   . Callus   . Carotid stenosis, bilateral   . Chest pain in adult   . Chronic back pain   . Colon polyp   . Coronary artery disease, occlusive    stent x 2  . Depression   . Diabetic neuropathy (HCC) 06/02/2018   type 2  . Eosinophilic esophagitis   . Eosinophilic gastritis   . Fatigue   . Gastritis   . Gastroesophagitis   . GERD (gastroesophageal reflux disease)   . H. pylori infection   . H/O: hysterectomy   . Hypersomnia   . IBS (irritable bowel syndrome)   . Inflammatory bowel disease    colitis  . Inlet patch of esophagus   . Myocardial infarction (HCC)   . Pneumonia   . Precordial pain   . PVD (peripheral vascular disease) (HCC)    pt. denies  . Restless leg syndrome   . Right foot pain   . Shortness of breath on exertion   . Shoulder pain, right   . Ulcer of esophagus without bleeding     Past Surgical History:  Procedure Laterality Date  . AUGMENTATION  MAMMAPLASTY Bilateral    breast lift  . BREAST EXCISIONAL BIOPSY Right   . CORONARY STENT INTERVENTION N/A 05/27/2017   Procedure: CORONARY STENT INTERVENTION;  Surgeon: Runell Gess, MD;  [facility];  Service: Cardiovascular;  Laterality: N/A;  . EYE SURGERY     laser surgery for hole in eye  . FOOT SURGERY Left   . INTRAVASCULAR PRESSURE WIRE/FFR STUDY N/A 05/27/2017   Procedure: INTRAVASCULAR PRESSURE WIRE/FFR STUDY;  Surgeon: Runell Gess, MD;  [facility]: [facility];  Service: Cardiovascular;  Laterality: N/A;  . LEFT HEART CATH AND CORONARY ANGIOGRAPHY N/A 05/27/2017   Procedure: LEFT HEART CATH AND CORONARY ANGIOGRAPHY;  Surgeon: Runell Gess, MD;  [facility]: [facility];  Service: Cardiovascular;  Laterality: N/A;  . SHOULDER ARTHROSCOPY WITH DISTAL CLAVICLE RESECTION Right 12/05/2019   Procedure: SHOULDER ARTHROSCOPY, SUBACROMIAL DECOMPRESSION AND DISTAL CLAVICLE RESECTION, MINI OPEN ROTATOR CUFF REPAIR (POSSIBLE DERMASPAN PATCH);  Surgeon: Valeria Batman, MD;  Location: [facility];  Service: Orthopedics;  Laterality: Right;  . THYROIDECTOMY      There were no vitals filed for this visit.  [facility] Adult PT Treatment/Exercise - 01/22/20 0001      Shoulder Exercises: Seated   Extension Strengthening;Right   30 sec isometric   Row Strengthening;Right;10 reps;Theraband   yellow   External Rotation AAROM;Right;10 reps   with dowel and then with yellow tband and then 30 sec isomet   Internal Rotation Strengthening;Right   30 sec isometric   Flexion AAROM;Right;10 reps   with dowel and then 30 sec isometrics   Abduction AAROM;10 reps   with dowel     Manual Therapy   Manual Therapy Passive ROM;Joint mobilization    Joint Mobilization Grade II to III posterior and inf glides    Passive ROM Rt GHJ within protocol limitations; PROM abd and flexion.                     PT Short Term Goals -  01/22/20 0910      PT SHORT TERM GOAL #1   Title Patient will increase passive shoulder ER to 30 degrees    Time 2    Period Weeks    Status Achieved    Target Date 01/11/20      PT SHORT TERM GOAL #2   Title Patient will increase passvie right shoulder flexion to 90 degres    Baseline 45    Time 3    Period Weeks    Status Achieved    Target Date 01/18/20      PT SHORT TERM GOAL #3   Title Patient will wean out of sling per MD instructions    Baseline still in the sling for 2-3 more weeks    Time 3    Period Weeks    Status On-going    Target Date 01/18/20             PT Long Term Goals - 01/22/20 0911      PT LONG TERM GOAL #1   Title Patient will reach behind her head without pain    Baseline can not reach    Time 8    Period Weeks    Status Revised    Target Date 03/18/20      PT LONG TERM GOAL #2   Title Patient will reach to an overhsad shelf with a 2 lb weight without pain    Baseline can not reach    Time 8    Period Weeks    Status Revised    Target Date 03/18/20      PT LONG TERM GOAL #3   Title Pt will achieve at least 120 deg of Rt shoulder flexion and abduction for functional ROM    Time 8    Period Weeks    Target Date 03/18/20      PT LONG TERM GOAL #4   Title Pt will demonstrate at least 4/5 shoulder strength in all directions    Time 8    Period Weeks    Status New    Target Date 03/18/20                 Plan - 01/22/20 0908    Clinical Impression Statement Pt missed last week due to issues with transportation. Initiated Week 4 of protocol. Flexion to ~90 deg, abd ~80 deg, ER ~45 deg, extension ~25 degrees. Pt beginning AAROM and provided yellow t-band. Pt meeting goals but continues to have deficits that would benefit from continued PT to finish her course of rehab s/p rotator cuff repair. Goals revised and  updated accordingly    Personal Factors and Comorbidities Fitness;Past/Current Experience;Social Background;Time since  onset of injury/illness/exacerbation;Transportation    Examination-Activity Limitations Bathing;Carry;Reach Overhead;Sleep;Lift;Hygiene/Grooming;Dressing    Examination-Participation Restrictions Meal Prep;Cleaning;Community Activity;Driving;Shop;Laundry;Yard Work    Stability/Clinical Decision Making Stable/Uncomplicated    Rehab Potential Good    PT Frequency 1x / week    PT Duration 3 weeks    PT Treatment/Interventions ADLs/Self Care Home Management;Cryotherapy;Electrical Stimulation;Moist Heat;Neuromuscular re-education;Therapeutic exercise;Therapeutic activities;Functional mobility training;Patient/family education;Manual techniques;Dry needling;Passive range of motion;Scar mobilization;Taping;Vasopneumatic Device;Joint Manipulations;Spinal Manipulations;Iontophoresis 4mg /ml Dexamethasone;Ultrasound    PT Next Visit Plan progress per SOS protocol in book unless otherwise specified by MD    PT Home Exercise Plan Access Code [email or web address]    Consulted and Agree with Plan of Care Patient           Patient will benefit from skilled therapeutic intervention in order to improve the following deficits and impairments:  Decreased range of motion, Impaired UE functional use, Decreased activity tolerance, Pain, Impaired flexibility, Hypomobility, Decreased strength, Postural dysfunction, Decreased safety awareness  Visit Diagnosis: Chronic right shoulder pain  Muscle weakness (generalized)  Stiffness of right shoulder, not elsewhere classified  S/P right rotator cuff repair     Problem List Patient Active Problem List   Diagnosis Date Noted  . Hypoxia 12/05/2019  . Diabetic neuropathy ([place]) 06/02/2018  . H/O: hysterectomy   . Abdominal wall pain   . Shoulder pain, right   . Callus   . Arthropathy   . Fatigue   . Arterial bruit   . Barrett's esophagus   . Ulcer of esophagus without bleeding   . Right foot pain   . Inflammatory bowel disease   . Precordial pain   . Colon polyp     . Chest pain in adult   . Shortness of breath on exertion   . Restless leg syndrome   . Coronary artery disease, occlusive   . Eosinophilic esophagitis   . Gastroesophagitis   . Gastritis   . H. pylori infection   . Inlet patch of esophagus   . PVD (peripheral vascular disease) ([place])   . Eosinophilic gastritis   . Chronic back pain   . Abdominal pain, right lower quadrant   . Hypersomnia   . Carotid stenosis, bilateral   . CAD (coronary artery disease) 05/27/2017  . Unstable angina ([place])   . Coronary artery disease 05/25/2017  . Essential hypertension 05/25/2017  . Hyperlipidemia 05/25/2017    Konya Fauble April Ma L Musab Wingard PT, DPT 01/22/2020, 9:37 AM  [facility] [address] [place], [place], [address] Phone: [phone]   Fax:  [phone]  Name: Corra Kaine MRN: [id number] Date of Birth: 1961/05/17

## 2020-01-24 ENCOUNTER — Ambulatory Visit: Payer: Medicaid Other | Admitting: Orthopaedic Surgery

## 2020-01-29 ENCOUNTER — Ambulatory Visit: Payer: Medicaid Other | Admitting: Physical Therapy

## 2020-01-29 ENCOUNTER — Other Ambulatory Visit: Payer: Self-pay

## 2020-01-29 DIAGNOSIS — Z9889 Other specified postprocedural states: Secondary | ICD-10-CM

## 2020-01-29 DIAGNOSIS — M6281 Muscle weakness (generalized): Secondary | ICD-10-CM

## 2020-01-29 DIAGNOSIS — M25611 Stiffness of right shoulder, not elsewhere classified: Secondary | ICD-10-CM

## 2020-01-29 DIAGNOSIS — M25511 Pain in right shoulder: Secondary | ICD-10-CM | POA: Diagnosis not present

## 2020-01-29 DIAGNOSIS — G8929 Other chronic pain: Secondary | ICD-10-CM

## 2020-01-29 NOTE — Therapy (Signed)
[facility] Outpatient Rehabilitation [facility] [address] [place], [place], [address] Phone: [phone]   Fax:  [phone]  Physical Therapy Treatment  Patient Details  Name: Lore Polka MRN: [id number] Date of Birth: 09-18-1960 Referring Provider (PT): Lavera Guise    Encounter Date: 01/29/2020   PT End of Session - 01/29/20 0917    Visit Number 5    Number of Visits 16    Date for PT Re-Evaluation 01/25/20    Authorization Type Medicaid - re-auth requested for 12 more visits    Authorization - Visit Number 1    Authorization - Number of Visits 12    PT Start Time 0918    PT Stop Time 1002    PT Time Calculation (min) 44 min    Activity Tolerance Patient tolerated treatment well    Behavior During Therapy [facility] for tasks assessed/performed           Past Medical History:  Diagnosis Date  . Abdominal pain, right lower quadrant   . Abdominal wall pain   . Arterial bruit   . Arthritis   . Arthropathy   . Barrett's esophagus   . Callus   . Carotid stenosis, bilateral   . Chest pain in adult   . Chronic back pain   . Colon polyp   . Coronary artery disease, occlusive    stent x 2  . Depression   . Diabetic neuropathy (HCC) 06/02/2018   type 2  . Eosinophilic esophagitis   . Eosinophilic gastritis   . Fatigue   . Gastritis   . Gastroesophagitis   . GERD (gastroesophageal reflux disease)   . H. pylori infection   . H/O: hysterectomy   . Hypersomnia   . IBS (irritable bowel syndrome)   . Inflammatory bowel disease    colitis  . Inlet patch of esophagus   . Myocardial infarction (HCC)   . Pneumonia   . Precordial pain   . PVD (peripheral vascular disease) (HCC)    pt. denies  . Restless leg syndrome   . Right foot pain   . Shortness of breath on exertion   . Shoulder pain, right   . Ulcer of esophagus without bleeding     Past Surgical History:  Procedure Laterality Date  . AUGMENTATION MAMMAPLASTY Bilateral    breast lift  .  BREAST EXCISIONAL BIOPSY Right   . CORONARY STENT INTERVENTION N/A 05/27/2017   Procedure: CORONARY STENT INTERVENTION;  Surgeon: Runell Gess, MD;  [facility];  Service: Cardiovascular;  Laterality: N/A;  . EYE SURGERY     laser surgery for hole in eye  . FOOT SURGERY Left   . INTRAVASCULAR PRESSURE WIRE/FFR STUDY N/A 05/27/2017   Procedure: INTRAVASCULAR PRESSURE WIRE/FFR STUDY;  Surgeon: Runell Gess, MD;  [facility]: [facility];  Service: Cardiovascular;  Laterality: N/A;  . LEFT HEART CATH AND CORONARY ANGIOGRAPHY N/A 05/27/2017   Procedure: LEFT HEART CATH AND CORONARY ANGIOGRAPHY;  Surgeon: Runell Gess, MD;  [facility]: [facility];  Service: Cardiovascular;  Laterality: N/A;  . SHOULDER ARTHROSCOPY WITH DISTAL CLAVICLE RESECTION Right 12/05/2019   Procedure: SHOULDER ARTHROSCOPY, SUBACROMIAL DECOMPRESSION AND DISTAL CLAVICLE RESECTION, MINI OPEN ROTATOR CUFF REPAIR (POSSIBLE DERMASPAN PATCH);  Surgeon: Valeria Batman, MD;  Location: [facility];  Service: Orthopedics;  Laterality: Right;  . THYROIDECTOMY      There were no vitals filed for this visit.   Subjective Assessment - 01/29/20 1012  Subjective Pt reports that due to transportation issues, she was unable to see her doctor to be fully weaned from the sling. Pt states she has been performing her HEP.    Limitations Lifting;House hold activities    Diagnostic tests X-ray, MRI    Patient Stated Goals Get rid of pain and move shoulder better    Currently in Pain? No/denies    Pain Onset More than a month ago                             [facility] Adult PT Treatment/Exercise - 01/29/20 0001      Shoulder Exercises: Supine   External Rotation AAROM;Right   with dowel; ~60 deg   Flexion AAROM;10 reps   with dowel; ~90 deg   ABduction AAROM;Right   using dowel; ~90 deg     Shoulder Exercises: Seated   Row Strengthening;Right;10 reps;Theraband    Theraband Level  (Shoulder Row) Level 1 (Yellow)      Shoulder Exercises: Standing   Flexion AAROM;Right   finger crawl on wall; cueing to decrease upper trap activati   ABduction AAROM;Right;10 reps   finger crawl on the wall; cueing to decrease upper trap    Extension Strengthening;Both;10 reps;Theraband    Theraband Level (Shoulder Extension) Level 1 (Yellow)    Other Standing Exercises serratus anterior punch with yellow band x 10 reps      Manual Therapy   Manual Therapy Passive ROM;Joint mobilization    Joint Mobilization Grade II to III posterior and inf glides    Passive ROM Rt GHJ within protocol limitations; PROM abd and flexion.                   PT Education - 01/29/20 1013    Education Details Discussed that pt will likely feel discomfort with her AAROM at home if we are to improve her ROM. Educated pt on reducing upper trap activation.    Person(s) Educated Patient    Methods Explanation;Demonstration;Verbal cues;Tactile cues;Handout    Comprehension Verbalized understanding;Returned demonstration;Verbal cues required;Tactile cues required;Need further instruction            PT Short Term Goals - 01/22/20 0910      PT SHORT TERM GOAL #1   Title Patient will increase passive shoulder ER to 30 degrees    Time 2    Period Weeks    Status Achieved    Target Date 01/11/20      PT SHORT TERM GOAL #2   Title Patient will increase passvie right shoulder flexion to 90 degres    Baseline 45    Time 3    Period Weeks    Status Achieved    Target Date 01/18/20      PT SHORT TERM GOAL #3   Title Patient will wean out of sling per MD instructions    Baseline still in the sling for 2-3 more weeks    Time 3    Period Weeks    Status On-going    Target Date 01/18/20             PT Long Term Goals - 01/22/20 0911      PT LONG TERM GOAL #1   Title Patient will reach behind her head without pain    Baseline can not reach    Time 8    Period Weeks    Status Revised     Target Date 03/18/20  PT LONG TERM GOAL #2   Title Patient will reach to an overhsad shelf with a 2 lb weight without pain    Baseline can not reach    Time 8    Period Weeks    Status Revised    Target Date 03/18/20      PT LONG TERM GOAL #3   Title Pt will achieve at least 120 deg of Rt shoulder flexion and abduction for functional ROM    Time 8    Period Weeks    Target Date 03/18/20      PT LONG TERM GOAL #4   Title Pt will demonstrate at least 4/5 shoulder strength in all directions    Time 8    Period Weeks    Status New    Target Date 03/18/20                 Plan - 01/29/20 1010    Clinical Impression Statement Pt at week 5 of protocol. Flexion remains ~90 deg without upper trap compensation, abd ~90 deg, ER improving to ~60 deg, extension WFL. Continued AAROM and PROM with mobilizations to obtain more range, encouraged more concentric strengthening with yellow t-band. Focus on scapulohumeral rhythm with decreased upper trap activation.    Personal Factors and Comorbidities Fitness;Past/Current Experience;Social Background;Time since onset of injury/illness/exacerbation;Transportation    Examination-Activity Limitations Bathing;Carry;Reach Overhead;Sleep;Lift;Hygiene/Grooming;Dressing    Examination-Participation Restrictions Meal Prep;Cleaning;Community Activity;Driving;Shop;Laundry;Yard Work    Stability/Clinical Decision Making Stable/Uncomplicated    Rehab Potential Good    PT Frequency 1x / week    PT Duration 3 weeks    PT Treatment/Interventions ADLs/Self Care Home Management;Cryotherapy;Electrical Stimulation;Moist Heat;Neuromuscular re-education;Therapeutic exercise;Therapeutic activities;Functional mobility training;Patient/family education;Manual techniques;Dry needling;Passive range of motion;Scar mobilization;Taping;Vasopneumatic Device;Joint Manipulations;Spinal Manipulations;Iontophoresis 4mg /ml Dexamethasone;Ultrasound    PT Next Visit Plan  progress per SOS protocol in book unless otherwise specified by MD    PT Home Exercise Plan Access Code [email or web address]    Consulted and Agree with Plan of Care Patient           Patient will benefit from skilled therapeutic intervention in order to improve the following deficits and impairments:  Decreased range of motion, Impaired UE functional use, Decreased activity tolerance, Pain, Impaired flexibility, Hypomobility, Decreased strength, Postural dysfunction, Decreased safety awareness  Visit Diagnosis: Chronic right shoulder pain  Muscle weakness (generalized)  Stiffness of right shoulder, not elsewhere classified  S/P right rotator cuff repair     Problem List Patient Active Problem List   Diagnosis Date Noted  . Hypoxia 12/05/2019  . Diabetic neuropathy (HCC) 06/02/2018  . H/O: hysterectomy   . Abdominal wall pain   . Shoulder pain, right   . Callus   . Arthropathy   . Fatigue   . Arterial bruit   . Barrett's esophagus   . Ulcer of esophagus without bleeding   . Right foot pain   . Inflammatory bowel disease   . Precordial pain   . Colon polyp   . Chest pain in adult   . Shortness of breath on exertion   . Restless leg syndrome   . Coronary artery disease, occlusive   . Eosinophilic esophagitis   . Gastroesophagitis   . Gastritis   . H. pylori infection   . Inlet patch of esophagus   . PVD (peripheral vascular disease) (HCC)   . Eosinophilic gastritis   . Chronic back pain   . Abdominal pain, right lower quadrant   . Hypersomnia   . Carotid stenosis, bilateral   .  CAD (coronary artery disease) 05/27/2017  . Unstable angina (HCC)   . Coronary artery disease 05/25/2017  . Essential hypertension 05/25/2017  . Hyperlipidemia 05/25/2017    Hilliary Jock April Ma L Carlas Vandyne PT, DPT 01/29/2020, 10:16 AM  [facility] [address] [place], [place], [address] Phone: [phone]   Fax:  [phone]  Name: Parnika Tweten MRN: [id number] Date of Birth: Mar 13, 1961

## 2020-01-31 ENCOUNTER — Ambulatory Visit: Payer: Medicaid Other | Admitting: Physical Therapy

## 2020-01-31 ENCOUNTER — Other Ambulatory Visit: Payer: Self-pay

## 2020-01-31 DIAGNOSIS — G8929 Other chronic pain: Secondary | ICD-10-CM

## 2020-01-31 DIAGNOSIS — M25611 Stiffness of right shoulder, not elsewhere classified: Secondary | ICD-10-CM

## 2020-01-31 DIAGNOSIS — Z9889 Other specified postprocedural states: Secondary | ICD-10-CM

## 2020-01-31 DIAGNOSIS — M25511 Pain in right shoulder: Secondary | ICD-10-CM | POA: Diagnosis not present

## 2020-01-31 DIAGNOSIS — M6281 Muscle weakness (generalized): Secondary | ICD-10-CM

## 2020-01-31 NOTE — Therapy (Signed)
[facility] [address] [place], [place], [address] Phone: [phone]   Fax:  [phone]  Physical Therapy Treatment  Patient Details  Name: [name] MRN: [id number] Date of Birth: 1961-06-17 Referring Provider (PT): Lavera Guise    Encounter Date: 01/31/2020   PT End of Session - 01/31/20 1004    Visit Number 6    Number of Visits 16    Date for PT Re-Evaluation 01/25/20    Authorization Type Medicaid - re-auth requested for 12 more visits    Authorization - Visit Number 1    Authorization - Number of Visits 12    PT Start Time 0920    PT Stop Time 1004    PT Time Calculation (min) 44 min    Activity Tolerance Patient tolerated treatment well    Behavior During Therapy [facility] for tasks assessed/performed           Past Medical History:  Diagnosis Date  . Abdominal pain, right lower quadrant   . Abdominal wall pain   . Arterial bruit   . Arthritis   . Arthropathy   . Barrett's esophagus   . Callus   . Carotid stenosis, bilateral   . Chest pain in adult   . Chronic back pain   . Colon polyp   . Coronary artery disease, occlusive    stent x 2  . Depression   . Diabetic neuropathy (HCC) 06/02/2018   type 2  . Eosinophilic esophagitis   . Eosinophilic gastritis   . Fatigue   . Gastritis   . Gastroesophagitis   . GERD (gastroesophageal reflux disease)   . H. pylori infection   . H/O: hysterectomy   . Hypersomnia   . IBS (irritable bowel syndrome)   . Inflammatory bowel disease    colitis  . Inlet patch of esophagus   . Myocardial infarction (HCC)   . Pneumonia   . Precordial pain   . PVD (peripheral vascular disease) (HCC)    pt. denies  . Restless leg syndrome   . Right foot pain   . Shortness of breath on exertion   . Shoulder pain, right   . Ulcer of esophagus without bleeding     Past Surgical History:  Procedure Laterality Date  . AUGMENTATION MAMMAPLASTY Bilateral    breast lift  .  BREAST EXCISIONAL BIOPSY Right   . CORONARY STENT INTERVENTION N/A 05/27/2017   Procedure: CORONARY STENT INTERVENTION;  Surgeon: Runell Gess, MD;  [facility];  Service: Cardiovascular;  Laterality: N/A;  . EYE SURGERY     laser surgery for hole in eye  . FOOT SURGERY Left   . INTRAVASCULAR PRESSURE WIRE/FFR STUDY N/A 05/27/2017   Procedure: INTRAVASCULAR PRESSURE WIRE/FFR STUDY;  Surgeon: Runell Gess, MD;  [facility];  Service: Cardiovascular;  Laterality: N/A;  . LEFT HEART CATH AND CORONARY ANGIOGRAPHY N/A 05/27/2017   Procedure: LEFT HEART CATH AND CORONARY ANGIOGRAPHY;  Surgeon: Runell Gess, MD;  [facility];  Service: Cardiovascular;  Laterality: N/A;  . SHOULDER ARTHROSCOPY WITH DISTAL CLAVICLE RESECTION Right 12/05/2019   Procedure: SHOULDER ARTHROSCOPY, SUBACROMIAL DECOMPRESSION AND DISTAL CLAVICLE RESECTION, MINI OPEN ROTATOR CUFF REPAIR (POSSIBLE DERMASPAN PATCH);  Surgeon: Valeria Batman, MD;  Location: [facility];  Service: Orthopedics;  Laterality: Right;  . THYROIDECTOMY      There were no vitals filed for this visit.  [facility] Adult PT Treatment/Exercise - 01/31/20 0001      Shoulder Exercises: Seated   Flexion AAROM;Right;10 reps   5 sec; table stretch   Abduction AAROM;Right;10 reps   5 sec hold, table stretch   Other Seated Exercises Finger wall crawl flexion & abduction x 2 sets   Cues to decrease upper trap and scapular motion     Manual Therapy   Manual Therapy Passive ROM;Joint mobilization;Soft tissue mobilization    Joint Mobilization Grade II to III posterior and inf glides    Soft tissue mobilization Rt upper trap and levator    Passive ROM Rt GHJ within protocol limitations; PROM abd and flexion.                     PT Short Term Goals - 01/22/20 0910      PT SHORT TERM GOAL #1   Title Patient will increase passive shoulder ER to 30 degrees      Time 2    Period Weeks    Status Achieved    Target Date 01/11/20      PT SHORT TERM GOAL #2   Title Patient will increase passvie right shoulder flexion to 90 degres    Baseline 45    Time 3    Period Weeks    Status Achieved    Target Date 01/18/20      PT SHORT TERM GOAL #3   Title Patient will wean out of sling per MD instructions    Baseline still in the sling for 2-3 more weeks    Time 3    Period Weeks    Status On-going    Target Date 01/18/20             PT Long Term Goals - 01/22/20 0911      PT LONG TERM GOAL #1   Title Patient will reach behind her head without pain    Baseline can not reach    Time 8    Period Weeks    Status Revised    Target Date 03/18/20      PT LONG TERM GOAL #2   Title Patient will reach to an overhsad shelf with a 2 lb weight without pain    Baseline can not reach    Time 8    Period Weeks    Status Revised    Target Date 03/18/20      PT LONG TERM GOAL #3   Title Pt will achieve at least 120 deg of Rt shoulder flexion and abduction for functional ROM    Time 8    Period Weeks    Target Date 03/18/20      PT LONG TERM GOAL #4   Title Pt will demonstrate at least 4/5 shoulder strength in all directions    Time 8    Period Weeks    Status New    Target Date 03/18/20                 Plan - 01/31/20 1105    Clinical Impression Statement Flexion up to ~100 deg, abduction remains ~90 deg. Treatment continues to focus on AAROM, PROM/stretching, strengthening and scapulohumeral rhythm.    Personal Factors and Comorbidities Fitness;Past/Current Experience;Social Background;Time since onset of injury/illness/exacerbation;Transportation    Examination-Activity Limitations Bathing;Carry;Reach Overhead;Sleep;Lift;Hygiene/Grooming;Dressing    Examination-Participation Restrictions Meal Prep;Cleaning;Community Activity;Driving;Shop;Laundry;Yard Work    Stability/Clinical Decision Making Stable/Uncomplicated    Rehab  Potential Good    PT Frequency 1x /  week    PT Duration 3 weeks    PT Treatment/Interventions ADLs/Self Care Home Management;Cryotherapy;Electrical Stimulation;Moist Heat;Neuromuscular re-education;Therapeutic exercise;Therapeutic activities;Functional mobility training;Patient/family education;Manual techniques;Dry needling;Passive range of motion;Scar mobilization;Taping;Vasopneumatic Device;Joint Manipulations;Spinal Manipulations;Iontophoresis 4mg /ml Dexamethasone;Ultrasound    PT Next Visit Plan progress per SOS protocol in book unless otherwise specified by MD    PT Home Exercise Plan Access Code [email or web address]    Consulted and Agree with Plan of Care Patient           Patient will benefit from skilled therapeutic intervention in order to improve the following deficits and impairments:  Decreased range of motion, Impaired UE functional use, Decreased activity tolerance, Pain, Impaired flexibility, Hypomobility, Decreased strength, Postural dysfunction, Decreased safety awareness  Visit Diagnosis: Chronic right shoulder pain  Muscle weakness (generalized)  Stiffness of right shoulder, not elsewhere classified  S/P right rotator cuff repair     Problem List Patient Active Problem List   Diagnosis Date Noted  . Hypoxia 12/05/2019  . Diabetic neuropathy (HCC) 06/02/2018  . H/O: hysterectomy   . Abdominal wall pain   . Shoulder pain, right   . Callus   . Arthropathy   . Fatigue   . Arterial bruit   . Barrett's esophagus   . Ulcer of esophagus without bleeding   . Right foot pain   . Inflammatory bowel disease   . Precordial pain   . Colon polyp   . Chest pain in adult   . Shortness of breath on exertion   . Restless leg syndrome   . Coronary artery disease, occlusive   . Eosinophilic esophagitis   . Gastroesophagitis   . Gastritis   . H. pylori infection   . Inlet patch of esophagus   . PVD (peripheral vascular disease) (HCC)   . Eosinophilic gastritis   . Chronic  back pain   . Abdominal pain, right lower quadrant   . Hypersomnia   . Carotid stenosis, bilateral   . CAD (coronary artery disease) 05/27/2017  . Unstable angina (HCC)   . Coronary artery disease 05/25/2017  . Essential hypertension 05/25/2017  . Hyperlipidemia 05/25/2017    [name] PT, DPT 01/31/2020, 11:10 AM  [facility] [address] [place], [place], [place] Phone: [phone]   Fax:  [phone]  Name: [name] MRN: [name] Date of Birth: 06/06/1961

## 2020-02-06 ENCOUNTER — Ambulatory Visit: Payer: Medicaid Other | Admitting: Orthopaedic Surgery

## 2020-02-07 ENCOUNTER — Ambulatory Visit: Payer: Medicaid Other | Attending: Orthopedic Surgery | Admitting: Physical Therapy

## 2020-02-07 ENCOUNTER — Other Ambulatory Visit: Payer: Self-pay

## 2020-02-07 DIAGNOSIS — M25611 Stiffness of right shoulder, not elsewhere classified: Secondary | ICD-10-CM | POA: Diagnosis present

## 2020-02-07 DIAGNOSIS — M6281 Muscle weakness (generalized): Secondary | ICD-10-CM | POA: Diagnosis present

## 2020-02-07 DIAGNOSIS — Z9889 Other specified postprocedural states: Secondary | ICD-10-CM | POA: Diagnosis present

## 2020-02-07 DIAGNOSIS — M25511 Pain in right shoulder: Secondary | ICD-10-CM | POA: Insufficient documentation

## 2020-02-07 DIAGNOSIS — G8929 Other chronic pain: Secondary | ICD-10-CM | POA: Diagnosis present

## 2020-02-07 NOTE — Therapy (Signed)
[facility] [address] [place], [place], [address] Phone: [phone]   Fax:  [phone]  Physical Therapy Treatment  Patient Details  Name: [name] MRN: [id number] Date of Birth: 20-Dec-1960 Referring Provider (PT): Lavera Guise    Encounter Date: 02/07/2020   PT End of Session - 02/07/20 0849    Visit Number 7    Number of Visits 16    Date for PT Re-Evaluation 03/10/20    Authorization Type Medicaid - re-auth requested for 12 more visits    Authorization Time Period 6/28 to 8/8    Authorization - Visit Number 3    Authorization - Number of Visits 12    PT Start Time 0903    PT Stop Time 0950    PT Time Calculation (min) 47 min    Activity Tolerance Patient tolerated treatment well    Behavior During Therapy [facility] for tasks assessed/performed           Past Medical History:  Diagnosis Date  . Abdominal pain, right lower quadrant   . Abdominal wall pain   . Arterial bruit   . Arthritis   . Arthropathy   . Barrett's esophagus   . Callus   . Carotid stenosis, bilateral   . Chest pain in adult   . Chronic back pain   . Colon polyp   . Coronary artery disease, occlusive    stent x 2  . Depression   . Diabetic neuropathy (HCC) 06/02/2018   type 2  . Eosinophilic esophagitis   . Eosinophilic gastritis   . Fatigue   . Gastritis   . Gastroesophagitis   . GERD (gastroesophageal reflux disease)   . H. pylori infection   . H/O: hysterectomy   . Hypersomnia   . IBS (irritable bowel syndrome)   . Inflammatory bowel disease    colitis  . Inlet patch of esophagus   . Myocardial infarction (HCC)   . Pneumonia   . Precordial pain   . PVD (peripheral vascular disease) (HCC)    pt. denies  . Restless leg syndrome   . Right foot pain   . Shortness of breath on exertion   . Shoulder pain, right   . Ulcer of esophagus without bleeding     Past Surgical History:  Procedure Laterality Date  . AUGMENTATION  MAMMAPLASTY Bilateral    breast lift  . BREAST EXCISIONAL BIOPSY Right   . CORONARY STENT INTERVENTION N/A 05/27/2017   Procedure: CORONARY STENT INTERVENTION;  Surgeon: Runell Gess, MD;  [facility];  Service: Cardiovascular;  Laterality: N/A;  . EYE SURGERY     laser surgery for hole in eye  . FOOT SURGERY Left   . INTRAVASCULAR PRESSURE WIRE/FFR STUDY N/A 05/27/2017   Procedure: INTRAVASCULAR PRESSURE WIRE/FFR STUDY;  Surgeon: Runell Gess, MD;  [facility];  Service: Cardiovascular;  Laterality: N/A;  . LEFT HEART CATH AND CORONARY ANGIOGRAPHY N/A 05/27/2017   Procedure: LEFT HEART CATH AND CORONARY ANGIOGRAPHY;  Surgeon: Runell Gess, MD;  [facility];  Service: Cardiovascular;  Laterality: N/A;  . SHOULDER ARTHROSCOPY WITH DISTAL CLAVICLE RESECTION Right 12/05/2019   Procedure: SHOULDER ARTHROSCOPY, SUBACROMIAL DECOMPRESSION AND DISTAL CLAVICLE RESECTION, MINI OPEN ROTATOR CUFF REPAIR (POSSIBLE DERMASPAN PATCH);  Surgeon: Valeria Batman, MD;  Location: [facility];  Service: Orthopedics;  Laterality: Right;  . THYROIDECTOMY      There were no vitals filed for this visit.  [facility] Adult PT Treatment/Exercise - 02/07/20 0001      Shoulder Exercises: Supine   Horizontal ABduction Strengthening;Both;20 reps    Theraband Level (Shoulder Horizontal ABduction) Level 1 (Yellow)    External Rotation Strengthening;Both;Theraband;20 reps    Theraband Level (Shoulder External Rotation) Level 1 (Yellow)    Flexion Strengthening;Right;20 reps   Unable to tolerate yellow tband   ABduction Strengthening;Right;20 reps   unable to tolerate yellow tband     Shoulder Exercises: Standing   Row Strengthening;Both;10 reps;Theraband    Theraband Level (Shoulder Row) Level 1 (Yellow)    Other Standing Exercises wall slide for abduction x 3 reps    Other Standing Exercises finger crawl on wall flexion &  abduction x 5 reps      Shoulder Exercises: ROM/Strengthening   UBE (Upper Arm Bike) L1 x 4 min      Shoulder Exercises: Stretch   Other Shoulder Stretches doorway stretch x 30 sec      Manual Therapy   Manual Therapy Passive ROM;Joint mobilization;Soft tissue mobilization    Joint Mobilization Grade II to III posterior and inf glides    Soft tissue mobilization Rt upper trap and levator    Passive ROM Rt GHJ within protocol limitations; PROM abd and flexion.                     PT Short Term Goals - 01/22/20 0910      PT SHORT TERM GOAL #1   Title Patient will increase passive shoulder ER to 30 degrees    Time 2    Period Weeks    Status Achieved    Target Date 01/11/20      PT SHORT TERM GOAL #2   Title Patient will increase passvie right shoulder flexion to 90 degres    Baseline 45    Time 3    Period Weeks    Status Achieved    Target Date 01/18/20      PT SHORT TERM GOAL #3   Title Patient will wean out of sling per MD instructions    Baseline still in the sling for 2-3 more weeks    Time 3    Period Weeks    Status On-going    Target Date 01/18/20             PT Long Term Goals - 01/22/20 0911      PT LONG TERM GOAL #1   Title Patient will reach behind her head without pain    Baseline can not reach    Time 8    Period Weeks    Status Revised    Target Date 03/18/20      PT LONG TERM GOAL #2   Title Patient will reach to an overhsad shelf with a 2 lb weight without pain    Baseline can not reach    Time 8    Period Weeks    Status Revised    Target Date 03/18/20      PT LONG TERM GOAL #3   Title Pt will achieve at least 120 deg of Rt shoulder flexion and abduction for functional ROM    Time 8    Period Weeks    Target Date 03/18/20      PT LONG TERM GOAL #4   Title Pt will demonstrate at least 4/5 shoulder strength in all directions    Time 8    Period Weeks    Status New  Target Date 03/18/20                 Plan  - 02/07/20 0934    Clinical Impression Statement Flexion AROM ~90 deg. Abduction remains most limited due to pt complaining of biceps cramping near her end range. Treatment focused on AROM, initiating more strengthening with theraband, and manual therapy to continue to improve ROM.    Personal Factors and Comorbidities Fitness;Past/Current Experience;Social Background;Time since onset of injury/illness/exacerbation;Transportation    Examination-Activity Limitations Bathing;Carry;Reach Overhead;Sleep;Lift;Hygiene/Grooming;Dressing    Examination-Participation Restrictions Meal Prep;Cleaning;Community Activity;Driving;Shop;Laundry;Yard Work    Stability/Clinical Decision Making Stable/Uncomplicated    Rehab Potential Good    PT Frequency 1x / week    PT Duration 3 weeks    PT Treatment/Interventions ADLs/Self Care Home Management;Cryotherapy;Electrical Stimulation;Moist Heat;Neuromuscular re-education;Therapeutic exercise;Therapeutic activities;Functional mobility training;Patient/family education;Manual techniques;Dry needling;Passive range of motion;Scar mobilization;Taping;Vasopneumatic Device;Joint Manipulations;Spinal Manipulations;Iontophoresis 4mg /ml Dexamethasone;Ultrasound    PT Next Visit Plan progress per SOS protocol in book unless otherwise specified by MD. Pt currently on week 6.    PT Home Exercise Plan Access Code [email or web address]    Consulted and Agree with Plan of Care Patient           Patient will benefit from skilled therapeutic intervention in order to improve the following deficits and impairments:  Decreased range of motion, Impaired UE functional use, Decreased activity tolerance, Pain, Impaired flexibility, Hypomobility, Decreased strength, Postural dysfunction, Decreased safety awareness  Visit Diagnosis: Chronic right shoulder pain  Muscle weakness (generalized)  Stiffness of right shoulder, not elsewhere classified  S/P right rotator cuff repair     Problem  List Patient Active Problem List   Diagnosis Date Noted  . Hypoxia 12/05/2019  . Diabetic neuropathy (HCC) 06/02/2018  . H/O: hysterectomy   . Abdominal wall pain   . Shoulder pain, right   . Callus   . Arthropathy   . Fatigue   . Arterial bruit   . Barrett's esophagus   . Ulcer of esophagus without bleeding   . Right foot pain   . Inflammatory bowel disease   . Precordial pain   . Colon polyp   . Chest pain in adult   . Shortness of breath on exertion   . Restless leg syndrome   . Coronary artery disease, occlusive   . Eosinophilic esophagitis   . Gastroesophagitis   . Gastritis   . H. pylori infection   . Inlet patch of esophagus   . PVD (peripheral vascular disease) (HCC)   . Eosinophilic gastritis   . Chronic back pain   . Abdominal pain, right lower quadrant   . Hypersomnia   . Carotid stenosis, bilateral   . CAD (coronary artery disease) 05/27/2017  . Unstable angina (HCC)   . Coronary artery disease 05/25/2017  . Essential hypertension 05/25/2017  . Hyperlipidemia 05/25/2017    [name] PT, DPT 02/07/2020, 9:53 AM  [facility] [address] [place], [place], [place] Phone: [phone]   Fax:  [phone]  Name: [name] MRN: [name] Date of Birth: [birth year]

## 2020-02-08 ENCOUNTER — Ambulatory Visit (INDEPENDENT_AMBULATORY_CARE_PROVIDER_SITE_OTHER): Payer: Medicaid Other | Admitting: Orthopaedic Surgery

## 2020-02-08 ENCOUNTER — Encounter: Payer: Self-pay | Admitting: Orthopaedic Surgery

## 2020-02-08 VITALS — Ht 62.0 in | Wt 155.0 lb

## 2020-02-08 DIAGNOSIS — M25511 Pain in right shoulder: Secondary | ICD-10-CM

## 2020-02-08 DIAGNOSIS — G8929 Other chronic pain: Secondary | ICD-10-CM

## 2020-02-08 NOTE — Progress Notes (Signed)
Office Visit Note   Patient: [name]           Date of Birth: 14-Feb-1961           MRN: [id number] Visit Date: 02/08/2020              Requested by: Salli Real, MD [address] [place],  [place] [address] PCP: Salli Real, MD   Assessment & Plan: Visit Diagnoses:  1. Chronic right shoulder pain     Plan: Mrs. Dake is 2 months status post rotator cuff tear repair of her right shoulder with an SCD DCR and biceps tenodesis.  She is attending physical therapy and has been wearing her sling.  She has developed adhesive capsulitis which is not unusual in diabetics.  She will discontinue the sling and continue with physical therapy and work on her home exercises.  She relates that her shoulder otherwise is feeling better than it was preoperatively.office 1 month Follow-Up Instructions: Return in about 1 month (around 03/10/2020).   Orders:  No orders of the defined types were placed in this encounter.  No orders of the defined types were placed in this encounter.     Procedures: No procedures performed   Clinical Data: No additional findings.   Subjective: Chief Complaint  Patient presents with  . Right Shoulder - Follow-up    Right shoulder scope DOS 12-05-2019  Patient presents today for follow up on her right shoulder. She had a right shoulder arthroscopy on 12-05-2019. She is now two months out from surgery. She states that she is doing well. She states that her shoulder aches, but generally does not require any pain medicine. If needed she will take hydrocodone. She has been going to outpatient physical therapy twice weekly. She is still continuing to wear her sling.   HPI  Review of Systems   Objective: Vital Signs: Ht 5\' 2"  (1.575 m)   Wt 155 lb (70.3 kg)   BMI 28.35 kg/m   Physical Exam  Ortho Exam approximately 90 degrees of passive flexion right shoulder and about 70 degrees of abduction.  Negative impingement.  Biceps in good position.  Good grip and  release.  Motor and sensory exam intact.  Specialty Comments:  No specialty comments available.  Imaging: No results found.   PMFS History: Patient Active Problem List   Diagnosis Date Noted  . Hypoxia 12/05/2019  . Diabetic neuropathy (HCC) 06/02/2018  . H/O: hysterectomy   . Abdominal wall pain   . Shoulder pain, right   . Callus   . Arthropathy   . Fatigue   . Arterial bruit   . Barrett's esophagus   . Ulcer of esophagus without bleeding   . Right foot pain   . Inflammatory bowel disease   . Precordial pain   . Colon polyp   . Chest pain in adult   . Shortness of breath on exertion   . Restless leg syndrome   . Coronary artery disease, occlusive   . Eosinophilic esophagitis   . Gastroesophagitis   . Gastritis   . H. pylori infection   . Inlet patch of esophagus   . PVD (peripheral vascular disease) (HCC)   . Eosinophilic gastritis   . Chronic back pain   . Abdominal pain, right lower quadrant   . Hypersomnia   . Carotid stenosis, bilateral   . CAD (coronary artery disease) 05/27/2017  . Unstable angina (HCC)   . Coronary artery disease 05/25/2017  . Essential hypertension 05/25/2017  .  Hyperlipidemia 05/25/2017   Past Medical History:  Diagnosis Date  . Abdominal pain, right lower quadrant   . Abdominal wall pain   . Arterial bruit   . Arthritis   . Arthropathy   . Barrett's esophagus   . Callus   . Carotid stenosis, bilateral   . Chest pain in adult   . Chronic back pain   . Colon polyp   . Coronary artery disease, occlusive    stent x 2  . Depression   . Diabetic neuropathy (HCC) 06/02/2018   type 2  . Eosinophilic esophagitis   . Eosinophilic gastritis   . Fatigue   . Gastritis   . Gastroesophagitis   . GERD (gastroesophageal reflux disease)   . H. pylori infection   . H/O: hysterectomy   . Hypersomnia   . IBS (irritable bowel syndrome)   . Inflammatory bowel disease    colitis  . Inlet patch of esophagus   . Myocardial infarction  (HCC)   . Pneumonia   . Precordial pain   . PVD (peripheral vascular disease) (HCC)    pt. denies  . Restless leg syndrome   . Right foot pain   . Shortness of breath on exertion   . Shoulder pain, right   . Ulcer of esophagus without bleeding     Family History  Problem Relation Age of Onset  . Hyperlipidemia Mother   . Hypertension Mother   . Diabetes Mellitus II Mother   . Cancer Father   . Hypertension Sister   . Hypertension Brother   . Hypertension Brother   . Hypertension Brother   . Hypertension Sister   . Hypertension Sister   . Heart disease Maternal Aunt     Past Surgical History:  Procedure Laterality Date  . AUGMENTATION MAMMAPLASTY Bilateral    breast lift  . BREAST EXCISIONAL BIOPSY Right   . CORONARY STENT INTERVENTION N/A 05/27/2017   Procedure: CORONARY STENT INTERVENTION;  Surgeon: Runell Gess, MD;  [facility];  Service: Cardiovascular;  Laterality: N/A;  . EYE SURGERY     laser surgery for hole in eye  . FOOT SURGERY Left   . INTRAVASCULAR PRESSURE WIRE/FFR STUDY N/A 05/27/2017   Procedure: INTRAVASCULAR PRESSURE WIRE/FFR STUDY;  Surgeon: Runell Gess, MD;  [facility];  Service: Cardiovascular;  Laterality: N/A;  . LEFT HEART CATH AND CORONARY ANGIOGRAPHY N/A 05/27/2017   Procedure: LEFT HEART CATH AND CORONARY ANGIOGRAPHY;  Surgeon: Runell Gess, MD;  [facility];  Service: Cardiovascular;  Laterality: N/A;  . SHOULDER ARTHROSCOPY WITH DISTAL CLAVICLE RESECTION Right 12/05/2019   Procedure: SHOULDER ARTHROSCOPY, SUBACROMIAL DECOMPRESSION AND DISTAL CLAVICLE RESECTION, MINI OPEN ROTATOR CUFF REPAIR (POSSIBLE DERMASPAN PATCH);  Surgeon: Valeria Batman, MD;  Location: [facility];  Service: Orthopedics;  Laterality: Right;  . THYROIDECTOMY     Social History   Occupational History  . Occupation: Unemployed  Tobacco Use  . Smoking status: Current Every Day Smoker    Packs/day: 0.50     Years: 25.00    Pack years: 12.50    Types: Cigarettes  . Smokeless tobacco: Never Used  . Tobacco comment: Trying to stop smoking  Vaping Use  . Vaping Use: Former  Substance and Sexual Activity  . Alcohol use: No  . Drug use: No  . Sexual activity: Not Currently    Birth control/protection: Surgical    Comment: Partial Hyst

## 2020-02-09 ENCOUNTER — Ambulatory Visit: Payer: Medicaid Other | Admitting: Physical Therapy

## 2020-02-09 ENCOUNTER — Other Ambulatory Visit: Payer: Self-pay

## 2020-02-09 DIAGNOSIS — M25511 Pain in right shoulder: Secondary | ICD-10-CM | POA: Diagnosis not present

## 2020-02-09 DIAGNOSIS — M25611 Stiffness of right shoulder, not elsewhere classified: Secondary | ICD-10-CM

## 2020-02-09 DIAGNOSIS — Z9889 Other specified postprocedural states: Secondary | ICD-10-CM

## 2020-02-09 DIAGNOSIS — G8929 Other chronic pain: Secondary | ICD-10-CM

## 2020-02-09 DIAGNOSIS — M6281 Muscle weakness (generalized): Secondary | ICD-10-CM

## 2020-02-09 NOTE — Therapy (Signed)
[facility] [address] [place], [place], [address] Phone: [phone]   Fax:  [phone]  Physical Therapy Treatment  Patient Details  Name: Serine Kea MRN: [id number] Date of Birth: [birth year] Referring Provider (PT): Lavera Guise    Encounter Date: 02/09/2020   PT End of Session - 02/09/20 0901    Visit Number 8    Number of Visits 16    Date for PT Re-Evaluation 03/10/20    Authorization Type Medicaid - re-auth requested for 12 more visits    Authorization Time Period 6/28 to 8/8    Authorization - Visit Number 4    Authorization - Number of Visits 12    PT Start Time 0835    PT Stop Time 0918    PT Time Calculation (min) 43 min    Activity Tolerance Patient tolerated treatment well    Behavior During Therapy [facility] for tasks assessed/performed           Past Medical History:  Diagnosis Date  . Abdominal pain, right lower quadrant   . Abdominal wall pain   . Arterial bruit   . Arthritis   . Arthropathy   . Barrett's esophagus   . Callus   . Carotid stenosis, bilateral   . Chest pain in adult   . Chronic back pain   . Colon polyp   . Coronary artery disease, occlusive    stent x 2  . Depression   . Diabetic neuropathy (HCC) 06/02/2018   type 2  . Eosinophilic esophagitis   . Eosinophilic gastritis   . Fatigue   . Gastritis   . Gastroesophagitis   . GERD (gastroesophageal reflux disease)   . H. pylori infection   . H/O: hysterectomy   . Hypersomnia   . IBS (irritable bowel syndrome)   . Inflammatory bowel disease    colitis  . Inlet patch of esophagus   . Myocardial infarction (HCC)   . Pneumonia   . Precordial pain   . PVD (peripheral vascular disease) (HCC)    pt. denies  . Restless leg syndrome   . Right foot pain   . Shortness of breath on exertion   . Shoulder pain, right   . Ulcer of esophagus without bleeding     Past Surgical History:  Procedure Laterality Date  . AUGMENTATION  MAMMAPLASTY Bilateral    breast lift  . BREAST EXCISIONAL BIOPSY Right   . CORONARY STENT INTERVENTION N/A 05/27/2017   Procedure: CORONARY STENT INTERVENTION;  Surgeon: Runell Gess, MD;  [facility]: [facility];  Service: Cardiovascular;  Laterality: N/A;  . EYE SURGERY     laser surgery for hole in eye  . FOOT SURGERY Left   . INTRAVASCULAR PRESSURE WIRE/FFR STUDY N/A 05/27/2017   Procedure: INTRAVASCULAR PRESSURE WIRE/FFR STUDY;  Surgeon: Runell Gess, MD;  Location: [facility];  Service: Cardiovascular;  Laterality: N/A;  . LEFT HEART CATH AND CORONARY ANGIOGRAPHY N/A 05/27/2017   Procedure: LEFT HEART CATH AND CORONARY ANGIOGRAPHY;  Surgeon: Runell Gess, MD;  Location: [facility];  Service: Cardiovascular;  Laterality: N/A;  . SHOULDER ARTHROSCOPY WITH DISTAL CLAVICLE RESECTION Right 12/05/2019   Procedure: SHOULDER ARTHROSCOPY, SUBACROMIAL DECOMPRESSION AND DISTAL CLAVICLE RESECTION, MINI OPEN ROTATOR CUFF REPAIR (POSSIBLE DERMASPAN PATCH);  Surgeon: Valeria Batman, MD;  Location: [facility];  Service: Orthopedics;  Laterality: Right;  . THYROIDECTOMY      There were no vitals filed for this visit.  Subjective Assessment - 02/09/20 0839    Subjective Pt states she was able to see the doctor who told her things were going as expected. She states that her ortho mentioned that she might have increased aches and pains due to her diabetes. . P tstates that her shoulder feels very stiff today.    Limitations Lifting;House hold activities    Diagnostic tests X-ray, MRI    Patient Stated Goals Get rid of pain and move shoulder better    Pain Onset More than a month ago                             [facility] Adult PT Treatment/Exercise - 02/09/20 0001      Shoulder Exercises: Supine   Horizontal ABduction Strengthening;Both;20 reps    Theraband Level (Shoulder Horizontal ABduction) Level 2 (Red)    External Rotation  Strengthening;Both;Theraband;20 reps    Theraband Level (Shoulder External Rotation) Level 2 (Red)    Flexion Strengthening;Right;10 reps;Theraband    Theraband Level (Shoulder Flexion) Level 1 (Yellow)      Shoulder Exercises: Sidelying   Flexion Strengthening;Right;10 reps    ABduction Strengthening;10 reps      Shoulder Exercises: Standing   Other Standing Exercises wall slide flex & abd x 10 reps    Other Standing Exercises finger crawl on wall flexion & abduction x 5 reps      Manual Therapy   Manual Therapy Passive ROM;Joint mobilization;Soft tissue mobilization    Joint Mobilization Grade II to III posterior and inf glides    Passive ROM Rt GHJ within protocol limitations; PROM abd and flexion.                     PT Short Term Goals - 01/22/20 0910      PT SHORT TERM GOAL #1   Title Patient will increase passive shoulder ER to 30 degrees    Time 2    Period Weeks    Status Achieved    Target Date 01/11/20      PT SHORT TERM GOAL #2   Title Patient will increase passvie right shoulder flexion to 90 degres    Baseline 45    Time 3    Period Weeks    Status Achieved    Target Date 01/18/20      PT SHORT TERM GOAL #3   Title Patient will wean out of sling per MD instructions    Baseline still in the sling for 2-3 more weeks    Time 3    Period Weeks    Status On-going    Target Date 01/18/20             PT Long Term Goals - 01/22/20 0911      PT LONG TERM GOAL #1   Title Patient will reach behind her head without pain    Baseline can not reach    Time 8    Period Weeks    Status Revised    Target Date 03/18/20      PT LONG TERM GOAL #2   Title Patient will reach to an overhsad shelf with a 2 lb weight without pain    Baseline can not reach    Time 8    Period Weeks    Status Revised    Target Date 03/18/20      PT LONG TERM GOAL #3   Title Pt will achieve at  least 120 deg of Rt shoulder flexion and abduction for functional ROM     Time 8    Period Weeks    Target Date 03/18/20      PT LONG TERM GOAL #4   Title Pt will demonstrate at least 4/5 shoulder strength in all directions    Time 8    Period Weeks    Status New    Target Date 03/18/20                  Patient will benefit from skilled therapeutic intervention in order to improve the following deficits and impairments:     Visit Diagnosis: Chronic right shoulder pain  Muscle weakness (generalized)  Stiffness of right shoulder, not elsewhere classified  S/P right rotator cuff repair     Problem List Patient Active Problem List   Diagnosis Date Noted  . Hypoxia 12/05/2019  . Diabetic neuropathy (HCC) 06/02/2018  . H/O: hysterectomy   . Abdominal wall pain   . Shoulder pain, right   . Callus   . Arthropathy   . Fatigue   . Arterial bruit   . Barrett's esophagus   . Ulcer of esophagus without bleeding   . Right foot pain   . Inflammatory bowel disease   . Precordial pain   . Colon polyp   . Chest pain in adult   . Shortness of breath on exertion   . Restless leg syndrome   . Coronary artery disease, occlusive   . Eosinophilic esophagitis   . Gastroesophagitis   . Gastritis   . H. pylori infection   . Inlet patch of esophagus   . PVD (peripheral vascular disease) (HCC)   . Eosinophilic gastritis   . Chronic back pain   . Abdominal pain, right lower quadrant   . Hypersomnia   . Carotid stenosis, bilateral   . CAD (coronary artery disease) 05/27/2017  . Unstable angina (HCC)   . Coronary artery disease 05/25/2017  . Essential hypertension 05/25/2017  . Hyperlipidemia 05/25/2017    [facility] April Ma L Emmaleigh Longo PT, DPT 02/09/2020, 12:06 PM  [facility] [address] [place], [place], [address] Phone: [phone]   Fax:  [phone]  Name: Talyn Eddie MRN: [id number] Date of Birth: Jan 28, 1961

## 2020-02-13 ENCOUNTER — Ambulatory Visit: Payer: Medicaid Other | Admitting: Physical Therapy

## 2020-02-15 ENCOUNTER — Other Ambulatory Visit: Payer: Self-pay

## 2020-02-15 ENCOUNTER — Ambulatory Visit: Payer: Medicaid Other | Admitting: Physical Therapy

## 2020-02-15 DIAGNOSIS — M25611 Stiffness of right shoulder, not elsewhere classified: Secondary | ICD-10-CM

## 2020-02-15 DIAGNOSIS — M25511 Pain in right shoulder: Secondary | ICD-10-CM | POA: Diagnosis not present

## 2020-02-15 DIAGNOSIS — G8929 Other chronic pain: Secondary | ICD-10-CM

## 2020-02-15 DIAGNOSIS — Z9889 Other specified postprocedural states: Secondary | ICD-10-CM

## 2020-02-15 DIAGNOSIS — M6281 Muscle weakness (generalized): Secondary | ICD-10-CM

## 2020-02-15 NOTE — Therapy (Signed)
[facility] [address] [place], [place], [address] Phone: [phone]   Fax:  [phone]  Physical Therapy Treatment  Patient Details  Name: [name] MRN: [id number] Date of Birth: 1961-02-01 Referring Provider (PT): Lavera Guise    Encounter Date: 02/15/2020   PT End of Session - 02/15/20 0901    Visit Number 9    Number of Visits 16    Date for PT Re-Evaluation 03/10/20    Authorization Type Medicaid - re-auth requested for 12 more visits    Authorization Time Period 6/28 to 8/8    Authorization - Visit Number 5    Authorization - Number of Visits 12    PT Start Time 0840    PT Stop Time 0920    PT Time Calculation (min) 40 min    Activity Tolerance Patient tolerated treatment well    Behavior During Therapy [facility] for tasks assessed/performed           Past Medical History:  Diagnosis Date  . Abdominal pain, right lower quadrant   . Abdominal wall pain   . Arterial bruit   . Arthritis   . Arthropathy   . Barrett's esophagus   . Callus   . Carotid stenosis, bilateral   . Chest pain in adult   . Chronic back pain   . Colon polyp   . Coronary artery disease, occlusive    stent x 2  . Depression   . Diabetic neuropathy (HCC) 06/02/2018   type 2  . Eosinophilic esophagitis   . Eosinophilic gastritis   . Fatigue   . Gastritis   . Gastroesophagitis   . GERD (gastroesophageal reflux disease)   . H. pylori infection   . H/O: hysterectomy   . Hypersomnia   . IBS (irritable bowel syndrome)   . Inflammatory bowel disease    colitis  . Inlet patch of esophagus   . Myocardial infarction (HCC)   . Pneumonia   . Precordial pain   . PVD (peripheral vascular disease) (HCC)    pt. denies  . Restless leg syndrome   . Right foot pain   . Shortness of breath on exertion   . Shoulder pain, right   . Ulcer of esophagus without bleeding     Past Surgical History:  Procedure Laterality Date  . AUGMENTATION  MAMMAPLASTY Bilateral    breast lift  . BREAST EXCISIONAL BIOPSY Right   . CORONARY STENT INTERVENTION N/A 05/27/2017   Procedure: CORONARY STENT INTERVENTION;  Surgeon: Runell Gess, MD;  [facility]: [facility];  Service: Cardiovascular;  Laterality: N/A;  . EYE SURGERY     laser surgery for hole in eye  . FOOT SURGERY Left   . INTRAVASCULAR PRESSURE WIRE/FFR STUDY N/A 05/27/2017   Procedure: INTRAVASCULAR PRESSURE WIRE/FFR STUDY;  Surgeon: Runell Gess, MD;  [facility]: [facility];  Service: Cardiovascular;  Laterality: N/A;  . LEFT HEART CATH AND CORONARY ANGIOGRAPHY N/A 05/27/2017   Procedure: LEFT HEART CATH AND CORONARY ANGIOGRAPHY;  Surgeon: Runell Gess, MD;  Location: [facility];  Service: Cardiovascular;  Laterality: N/A;  . SHOULDER ARTHROSCOPY WITH DISTAL CLAVICLE RESECTION Right 12/05/2019   Procedure: SHOULDER ARTHROSCOPY, SUBACROMIAL DECOMPRESSION AND DISTAL CLAVICLE RESECTION, MINI OPEN ROTATOR CUFF REPAIR (POSSIBLE DERMASPAN PATCH);  Surgeon: Valeria Batman, MD;  Location: [facility];  Service: Orthopedics;  Laterality: Right;  . THYROIDECTOMY      There were no vitals filed for this visit.  Subjective Assessment - 02/15/20 0857    Subjective Pt reports feeling unwell earlier this week. Pt notes that she has been more comfortably able to scratch the top of her head.    Limitations Lifting;House hold activities    Diagnostic tests X-ray, MRI    Patient Stated Goals Get rid of pain and move shoulder better    Pain Onset More than a month ago                             [facility] Adult PT Treatment/Exercise - 02/15/20 0001      Shoulder Exercises: Supine   Horizontal ABduction --    External Rotation Strengthening;Both;10 reps    Flexion AROM;Both;10 reps    ABduction Strengthening;Right;10 reps      Shoulder Exercises: Pulleys   Flexion 2 minutes    Scaption 2 minutes      Shoulder Exercises: ROM/Strengthening    Wall Wash flexion, abduction, circles CW & CCW x 10 reps with 3 sec hold      Manual Therapy   Manual Therapy Passive ROM;Joint mobilization;Soft tissue mobilization    Joint Mobilization grade III posterior and inferior glides    Passive ROM Rt GHJ within protocol limitations; PROM abd and flexion.                     PT Short Term Goals - 01/22/20 0910      PT SHORT TERM GOAL #1   Title Patient will increase passive shoulder ER to 30 degrees    Time 2    Period Weeks    Status Achieved    Target Date 01/11/20      PT SHORT TERM GOAL #2   Title Patient will increase passvie right shoulder flexion to 90 degres    Baseline 45    Time 3    Period Weeks    Status Achieved    Target Date 01/18/20      PT SHORT TERM GOAL #3   Title Patient will wean out of sling per MD instructions    Baseline still in the sling for 2-3 more weeks    Time 3    Period Weeks    Status On-going    Target Date 01/18/20             PT Long Term Goals - 01/22/20 0911      PT LONG TERM GOAL #1   Title Patient will reach behind her head without pain    Baseline can not reach    Time 8    Period Weeks    Status Revised    Target Date 03/18/20      PT LONG TERM GOAL #2   Title Patient will reach to an overhsad shelf with a 2 lb weight without pain    Baseline can not reach    Time 8    Period Weeks    Status Revised    Target Date 03/18/20      PT LONG TERM GOAL #3   Title Pt will achieve at least 120 deg of Rt shoulder flexion and abduction for functional ROM    Time 8    Period Weeks    Target Date 03/18/20      PT LONG TERM GOAL #4   Title Pt will demonstrate at least 4/5 shoulder strength in all directions    Time 8    Period Weeks  Status New    Target Date 03/18/20                 Plan - 02/15/20 0903    Clinical Impression Statement Flexion and abduction appear a little more stiff this session. Treatment focused on increasing ROM with PROM, AAROM,  and AROM against gravity (flexion ~80 deg, abd ~45 deg).    Personal Factors and Comorbidities Fitness;Past/Current Experience;Social Background;Time since onset of injury/illness/exacerbation;Transportation    Examination-Activity Limitations Bathing;Carry;Reach Overhead;Sleep;Lift;Hygiene/Grooming;Dressing    Examination-Participation Restrictions Meal Prep;Cleaning;Community Activity;Driving;Shop;Laundry;Yard Work    Stability/Clinical Decision Making Stable/Uncomplicated    Rehab Potential Good    PT Frequency 1x / week    PT Duration 3 weeks    PT Treatment/Interventions ADLs/Self Care Home Management;Cryotherapy;Electrical Stimulation;Moist Heat;Neuromuscular re-education;Therapeutic exercise;Therapeutic activities;Functional mobility training;Patient/family education;Manual techniques;Dry needling;Passive range of motion;Scar mobilization;Taping;Vasopneumatic Device;Joint Manipulations;Spinal Manipulations;Iontophoresis 4mg /ml Dexamethasone;Ultrasound    PT Next Visit Plan progress per SOS protocol in book unless otherwise specified by MD. Pt currently on week 6.    PT Home Exercise Plan Access Code [email or web address]    Consulted and Agree with Plan of Care Patient           Patient will benefit from skilled therapeutic intervention in order to improve the following deficits and impairments:  Decreased range of motion, Impaired UE functional use, Decreased activity tolerance, Pain, Impaired flexibility, Hypomobility, Decreased strength, Postural dysfunction, Decreased safety awareness  Visit Diagnosis: Chronic right shoulder pain  Muscle weakness (generalized)  Stiffness of right shoulder, not elsewhere classified  S/P right rotator cuff repair     Problem List Patient Active Problem List   Diagnosis Date Noted  . Hypoxia 12/05/2019  . Diabetic neuropathy (HCC) 06/02/2018  . H/O: hysterectomy   . Abdominal wall pain   . Shoulder pain, right   . Callus   . Arthropathy   .  Fatigue   . Arterial bruit   . Barrett's esophagus   . Ulcer of esophagus without bleeding   . Right foot pain   . Inflammatory bowel disease   . Precordial pain   . Colon polyp   . Chest pain in adult   . Shortness of breath on exertion   . Restless leg syndrome   . Coronary artery disease, occlusive   . Eosinophilic esophagitis   . Gastroesophagitis   . Gastritis   . H. pylori infection   . Inlet patch of esophagus   . PVD (peripheral vascular disease) (HCC)   . Eosinophilic gastritis   . Chronic back pain   . Abdominal pain, right lower quadrant   . Hypersomnia   . Carotid stenosis, bilateral   . CAD (coronary artery disease) 05/27/2017  . Unstable angina (HCC)   . Coronary artery disease 05/25/2017  . Essential hypertension 05/25/2017  . Hyperlipidemia 05/25/2017    Bentleigh Waren April Ma L Derell Bruun PT, DPT 02/15/2020, 9:20 AM  [facility] [address] [place], [place], [place] Phone: [phone]   Fax:  [phone]  Name: [name] MRN: [name] Date of Birth: 06/05/1961

## 2020-02-19 ENCOUNTER — Ambulatory Visit: Payer: Medicaid Other | Admitting: Physical Therapy

## 2020-02-21 ENCOUNTER — Ambulatory Visit: Payer: Medicaid Other | Admitting: Physical Therapy

## 2020-02-26 ENCOUNTER — Ambulatory Visit: Payer: Medicaid Other | Admitting: Physical Therapy

## 2020-02-28 ENCOUNTER — Ambulatory Visit: Payer: Medicaid Other | Admitting: Physical Therapy

## 2020-03-04 ENCOUNTER — Encounter: Payer: Medicaid Other | Admitting: Physical Therapy

## 2020-03-06 ENCOUNTER — Encounter: Payer: Medicaid Other | Admitting: Physical Therapy

## 2020-03-11 ENCOUNTER — Ambulatory Visit: Payer: Medicaid Other | Attending: Orthopedic Surgery | Admitting: Physical Therapy

## 2020-03-11 ENCOUNTER — Other Ambulatory Visit: Payer: Self-pay

## 2020-03-11 DIAGNOSIS — M6281 Muscle weakness (generalized): Secondary | ICD-10-CM | POA: Insufficient documentation

## 2020-03-11 DIAGNOSIS — G8929 Other chronic pain: Secondary | ICD-10-CM

## 2020-03-11 DIAGNOSIS — M25511 Pain in right shoulder: Secondary | ICD-10-CM | POA: Diagnosis not present

## 2020-03-11 DIAGNOSIS — M25611 Stiffness of right shoulder, not elsewhere classified: Secondary | ICD-10-CM | POA: Diagnosis present

## 2020-03-11 DIAGNOSIS — Z9889 Other specified postprocedural states: Secondary | ICD-10-CM | POA: Insufficient documentation

## 2020-03-11 NOTE — Therapy (Addendum)
[place], [place], [address] Phone: [phone]   Fax:  [phone]  Physical Therapy Treatment and Discharge  Patient Details   Name: [name] MRN: [id number] Date of Birth: May 16, 1961 Referring Provider (PT): Collene Leyden    Encounter Date: 03/11/2020   PT End of Session - 03/11/20 0824    Visit Number 10    Number of Visits 16    Date for PT Re-Evaluation 03/10/20    Authorization Type Medicaid - re-auth requested    Authorization - Visit Number 6    Authorization - Number of Visits 12    PT Start Time 0829    PT Stop Time 0915    PT Time Calculation (min) 46 min    Activity Tolerance Patient tolerated treatment well    Behavior During Therapy [facility] for tasks assessed/performed           Past Medical History:  Diagnosis Date  . Abdominal pain, right lower quadrant   . Abdominal wall pain   . Arterial bruit   . Arthritis   . Arthropathy   . Barrett's esophagus   . Callus   . Carotid stenosis, bilateral   . Chest pain in adult   . Chronic back pain   . Colon polyp   . Coronary artery disease, occlusive    stent x 2  . Depression   . Diabetic neuropathy ([place]) 06/02/2018   type 2  . Eosinophilic esophagitis   . Eosinophilic gastritis   . Fatigue   . Gastritis   . Gastroesophagitis   . GERD (gastroesophageal reflux disease)   . H. pylori infection   . H/O: hysterectomy   . Hypersomnia   . IBS (irritable bowel syndrome)   . Inflammatory bowel disease    colitis  . Inlet patch of esophagus   . Myocardial infarction ([place])   . Pneumonia   . Precordial pain   . PVD (peripheral vascular disease) ([place])    pt. denies  . Restless leg syndrome   . Right foot pain   . Shortness of breath on exertion   . Shoulder pain, right   . Ulcer of esophagus without bleeding     Past Surgical History:  Procedure Laterality Date  . AUGMENTATION MAMMAPLASTY Bilateral    breast lift  .  BREAST EXCISIONAL BIOPSY Right   . CORONARY STENT INTERVENTION N/A 05/27/2017   Procedure: CORONARY STENT INTERVENTION;  Surgeon: Lorretta Harp, MD;  Location: [facility];  Service: Cardiovascular;  Laterality: N/A;  . EYE SURGERY     laser surgery for hole in eye  . FOOT SURGERY Left   . INTRAVASCULAR PRESSURE WIRE/FFR STUDY N/A 05/27/2017   Procedure: INTRAVASCULAR PRESSURE WIRE/FFR STUDY;  Surgeon: Lorretta Harp, MD;  Location: [facility];  Service: Cardiovascular;  Laterality: N/A;  . LEFT HEART CATH AND CORONARY ANGIOGRAPHY N/A 05/27/2017   Procedure: LEFT HEART CATH AND CORONARY ANGIOGRAPHY;  Surgeon: Lorretta Harp, MD;  Location: [facility];  Service: Cardiovascular;  Laterality: N/A;  . SHOULDER ARTHROSCOPY WITH DISTAL CLAVICLE RESECTION Right 12/05/2019   Procedure: SHOULDER ARTHROSCOPY, SUBACROMIAL DECOMPRESSION AND DISTAL CLAVICLE RESECTION, MINI OPEN ROTATOR CUFF REPAIR (POSSIBLE DERMASPAN PATCH);  Surgeon: Garald Balding, MD;  Location: [facility];  Service: Orthopedics;  Laterality: Right;  . THYROIDECTOMY      There were no vitals filed for this visit.   Subjective Assessment - 03/11/20 0830    Subjective  Pt states after she went to [place] for her cousin's funeral, another family member died in [place] so she had to miss another few weeks of therapy because of that. Pt states she's been doing her exercises. Pt states she's been using her arm and stretching it.    Limitations Lifting;House hold activities    Diagnostic tests X-ray, MRI    Patient Stated Goals Get rid of pain and move shoulder better    Currently in Pain? No/denies    Pain Onset More than a month ago              [facility] PT Assessment - 03/11/20 0001      AROM   Right Shoulder Flexion 85 Degrees   without compensation in sitting   Right Shoulder ABduction 75 Degrees   in sitting to point of no compensation   Right Shoulder Internal Rotation 30 Degrees    Right  Shoulder External Rotation --   In sitting     PROM   Right Shoulder Flexion 95 Degrees    Right Shoulder ABduction 95 Degrees                         [facility] Adult PT Treatment/Exercise - 03/11/20 0001      Shoulder Exercises: Standing   Other Standing Exercises counter plank 2 x 20 sec    Other Standing Exercises finger crawl on wall flexion & abduction x 3 reps      Shoulder Exercises: Pulleys   Flexion 2 minutes    ABduction 2 minutes      Shoulder Exercises: Stretch   Cross Chest Stretch 30 seconds;2 reps    Other Shoulder Stretches Sleeper stretch 2 x 30 sec      Manual Therapy   Manual Therapy Passive ROM;Joint mobilization;Soft tissue mobilization    Joint Mobilization grade III posterior and inferior glides in supine and in sitting with shoulder on bolster    Passive ROM Abd and flexion to gain more range in supine & in sitting                    PT Short Term Goals - 03/11/20 0825      PT SHORT TERM GOAL #1   Title Patient will increase passive shoulder ER to 30 degrees    Time 2    Period Weeks    Status Achieved    Target Date 01/11/20      PT SHORT TERM GOAL #2   Title Patient will increase passvie right shoulder flexion to 90 degres    Baseline 45    Time 3    Period Weeks    Status Achieved    Target Date 01/18/20      PT SHORT TERM GOAL #3   Title Patient will wean out of sling per MD instructions    Baseline still in the sling for 2-3 more weeks    Time 3    Period Weeks    Status Achieved    Target Date 01/18/20             PT Long Term Goals - 03/11/20 0916      PT LONG TERM GOAL #1   Title Patient will reach behind her head without pain    Baseline can not reach    Time 8    Period Weeks    Status Achieved      PT LONG TERM GOAL #2  Title Patient will reach to an overhsad shelf with a 2 lb weight without pain    Baseline unable without compensation    Time 8    Period Weeks    Status On-going      PT  LONG TERM GOAL #3   Title Pt will achieve at least 120 deg of Rt shoulder flexion and abduction for functional ROM    Baseline unable without compensation    Time 8    Period Weeks    Status On-going      PT LONG TERM GOAL #4   Title Pt will demonstrate at least 4/5 shoulder strength in all directions    Baseline 3-/5 due to limited ROM    Time 8    Period Weeks    Status On-going                 Plan - 03/11/20 1740    Clinical Impression Statement Pt has missed 4 weeks of PT due to multiple family deaths. GHJ particularly tight especially posterior capsule. Treatment focused on reobtaining PROM and improving AAROM and AROM. Pt with increased scapular compensation requiring multiple cues to decrease.    Personal Factors and Comorbidities Fitness;Past/Current Experience;Social Background;Time since onset of injury/illness/exacerbation;Transportation    Examination-Activity Limitations Bathing;Carry;Reach Overhead;Sleep;Lift;Hygiene/Grooming;Dressing    Examination-Participation Restrictions Meal Prep;Cleaning;Community Activity;Driving;Shop;Laundry;Yard Work    Stability/Clinical Decision Making Stable/Uncomplicated    Rehab Potential Good    PT Frequency 1x / week    PT Duration 3 weeks    PT Treatment/Interventions ADLs/Self Care Home Management;Cryotherapy;Electrical Stimulation;Moist Heat;Neuromuscular re-education;Therapeutic exercise;Therapeutic activities;Functional mobility training;Patient/family education;Manual techniques;Dry needling;Passive range of motion;Scar mobilization;Taping;Vasopneumatic Device;Joint Manipulations;Spinal Manipulations;Iontophoresis 64m/ml Dexamethasone;Ultrasound    PT Next Visit Plan progress per SOS protocol in book unless otherwise specified by MD. Pt currently on week 12.    PT Home Exercise Plan Access Code [email or web address]    Consulted and Agree with Plan of Care Patient           PHYSICAL THERAPY DISCHARGE SUMMARY  Visits from Start of  Care: 10  Current functional level related to goals / functional outcomes: See above   Remaining deficits: See above   Education / Equipment: See above  Plan:                                                    Patient goals were not met. Patient is being discharged due to not returning since the last visit.  ?????        Patient will benefit from skilled therapeutic intervention in order to improve the following deficits and impairments:  Decreased range of motion, Impaired UE functional use, Decreased activity tolerance, Pain, Impaired flexibility, Hypomobility, Decreased strength, Postural dysfunction, Decreased safety awareness  Visit Diagnosis: Chronic right shoulder pain  Muscle weakness (generalized)  Stiffness of right shoulder, not elsewhere classified  S/P right rotator cuff repair     Problem List Patient Active Problem List   Diagnosis Date Noted  . Hypoxia 12/05/2019  . Diabetic neuropathy ([place] 06/02/2018  . H/O: hysterectomy   . Abdominal wall pain   . Shoulder pain, right   . Callus   . Arthropathy   . Fatigue   . Arterial bruit   . Barrett's esophagus   . Ulcer of esophagus without bleeding   . Right foot pain   .  Inflammatory bowel disease   . Precordial pain   . Colon polyp   . Chest pain in adult   . Shortness of breath on exertion   . Restless leg syndrome   . Coronary artery disease, occlusive   . Eosinophilic esophagitis   . Gastroesophagitis   . Gastritis   . H. pylori infection   . Inlet patch of esophagus   . PVD (peripheral vascular disease) ([place])   . Eosinophilic gastritis   . Chronic back pain   . Abdominal pain, right lower quadrant   . Hypersomnia   . Carotid stenosis, bilateral   . CAD (coronary artery disease) 05/27/2017  . Unstable angina ([place])   . Coronary artery disease 05/25/2017  . Essential hypertension 05/25/2017  . Hyperlipidemia 05/25/2017    [name] [name] PT, DPT 03/11/2020, 9:44 AM  [facility] [address] [place], [place], [address] Phone: [phone]   Fax:  [phone]  Name: Alyx Mcguirk MRN: [id number] Date of Birth: [birth year]

## 2020-03-12 ENCOUNTER — Ambulatory Visit (INDEPENDENT_AMBULATORY_CARE_PROVIDER_SITE_OTHER): Payer: Medicaid Other | Admitting: Orthopaedic Surgery

## 2020-03-12 ENCOUNTER — Encounter: Payer: Self-pay | Admitting: Orthopaedic Surgery

## 2020-03-12 VITALS — Ht 62.0 in | Wt 155.0 lb

## 2020-03-12 DIAGNOSIS — M25511 Pain in right shoulder: Secondary | ICD-10-CM

## 2020-03-12 DIAGNOSIS — G8929 Other chronic pain: Secondary | ICD-10-CM

## 2020-03-12 NOTE — Progress Notes (Signed)
Office Visit Note   Patient: [name]           Date of Birth: 03/03/1961           MRN: [id number] Visit Date: 03/12/2020              Requested by: Salli Real, MD [address] [place],  [place] [address] PCP: Salli Real, MD   Assessment & Plan: Visit Diagnoses:  1. Chronic right shoulder pain     Plan: Just over 3 months status post right shoulder surgery including an arthroscopic SCD and DCR and mini open biceps tenodesis and supraspinatus rotator cuff tear repair.  She has developed an adhesive capsulitis with about 100 degrees of flexion.  She is diabetic.  We will continue therapy for another month and then return.  I think at that time we will consider closed manipulation if there is still an issue where she is not making any progress.  Overall feels like her preoperative pain has resolved  Follow-Up Instructions: Return in about 1 month (around 04/12/2020).   Orders:  No orders of the defined types were placed in this encounter.  No orders of the defined types were placed in this encounter.     Procedures: No procedures performed   Clinical Data: No additional findings.   Subjective: Chief Complaint  Patient presents with  . Right Shoulder - Follow-up    Right shoulder scope 12/05/2019  Patient presents today for follow up on her right shoulder. She had a right shoulder arthroscopy on 12/05/2019. She is now 3 months out from surgery. She is doing "better". She goes to therapy twice weekly. She takes hydrocodone every 4 hours through her primary care physician's office. she is right hand dominant.   HPI  Review of Systems   Objective: Vital Signs: Ht 5\' 2"  (1.575 m)   Wt 155 lb (70.3 kg)   BMI 28.35 kg/m   Physical Exam Constitutional:      Appearance: She is well-developed.  Eyes:     Pupils: Pupils are equal, round, and reactive to light.  Pulmonary:     Effort: Pulmonary effort is normal.  Skin:    General: Skin is warm and dry.  Neurological:      Mental Status: She is alert and oriented to person, place, and time.  Psychiatric:        Behavior: Behavior normal.     Ortho Exam awake alert and oriented x3.  Comfortable sitting.  Able to flex her right shoulder about 100 degrees at which point she was quite tight I could probably move it an additional 10degrees but there is a definite adhesive capsulitis.  Also has some limitation of internal and external rotation.  No localized tenderness.  Good grip and good release  Specialty Comments:  No specialty comments available.  Imaging: No results found.   PMFS History: Patient Active Problem List   Diagnosis Date Noted  . Hypoxia 12/05/2019  . Diabetic neuropathy ([facility]) 06/02/2018  . H/O: hysterectomy   . Abdominal wall pain   . Shoulder pain, right   . Callus   . Arthropathy   . Fatigue   . Arterial bruit   . Barrett's esophagus   . Ulcer of esophagus without bleeding   . Right foot pain   . Inflammatory bowel disease   . Precordial pain   . Colon polyp   . Chest pain in adult   . Shortness of breath on exertion   . Restless  leg syndrome   . Coronary artery disease, occlusive   . Eosinophilic esophagitis   . Gastroesophagitis   . Gastritis   . H. pylori infection   . Inlet patch of esophagus   . PVD (peripheral vascular disease) ([facility])   . Eosinophilic gastritis   . Chronic back pain   . Abdominal pain, right lower quadrant   . Hypersomnia   . Carotid stenosis, bilateral   . CAD (coronary artery disease) 05/27/2017  . Unstable angina ([facility])   . Coronary artery disease 05/25/2017  . Essential hypertension 05/25/2017  . Hyperlipidemia 05/25/2017   Past Medical History:  Diagnosis Date  . Abdominal pain, right lower quadrant   . Abdominal wall pain   . Arterial bruit   . Arthritis   . Arthropathy   . Barrett's esophagus   . Callus   . Carotid stenosis, bilateral   . Chest pain in adult   . Chronic back pain   . Colon polyp   . Coronary artery  disease, occlusive    stent x 2  . Depression   . Diabetic neuropathy ([facility]) 06/02/2018   type 2  . Eosinophilic esophagitis   . Eosinophilic gastritis   . Fatigue   . Gastritis   . Gastroesophagitis   . GERD (gastroesophageal reflux disease)   . H. pylori infection   . H/O: hysterectomy   . Hypersomnia   . IBS (irritable bowel syndrome)   . Inflammatory bowel disease    colitis  . Inlet patch of esophagus   . Myocardial infarction ([facility])   . Pneumonia   . Precordial pain   . PVD (peripheral vascular disease) ([facility])    pt. denies  . Restless leg syndrome   . Right foot pain   . Shortness of breath on exertion   . Shoulder pain, right   . Ulcer of esophagus without bleeding     Family History  Problem Relation Age of Onset  . Hyperlipidemia Mother   . Hypertension Mother   . Diabetes Mellitus II Mother   . Cancer Father   . Hypertension Sister   . Hypertension Brother   . Hypertension Brother   . Hypertension Brother   . Hypertension Sister   . Hypertension Sister   . Heart disease Maternal Aunt     Past Surgical History:  Procedure Laterality Date  . AUGMENTATION MAMMAPLASTY Bilateral    breast lift  . BREAST EXCISIONAL BIOPSY Right   . CORONARY STENT INTERVENTION N/A 05/27/2017   Procedure: CORONARY STENT INTERVENTION;  Surgeon: Runell Gess, MD;  [facility];  Service: Cardiovascular;  Laterality: N/A;  . EYE SURGERY     laser surgery for hole in eye  . FOOT SURGERY Left   . INTRAVASCULAR PRESSURE WIRE/FFR STUDY N/A 05/27/2017   Procedure: INTRAVASCULAR PRESSURE WIRE/FFR STUDY;  Surgeon: Runell Gess, MD;  [facility]: [facility];  Service: Cardiovascular;  Laterality: N/A;  . LEFT HEART CATH AND CORONARY ANGIOGRAPHY N/A 05/27/2017   Procedure: LEFT HEART CATH AND CORONARY ANGIOGRAPHY;  Surgeon: Runell Gess, MD;  [facility];  Service: Cardiovascular;  Laterality: N/A;  . SHOULDER ARTHROSCOPY WITH DISTAL  CLAVICLE RESECTION Right 12/05/2019   Procedure: SHOULDER ARTHROSCOPY, SUBACROMIAL DECOMPRESSION AND DISTAL CLAVICLE RESECTION, MINI OPEN ROTATOR CUFF REPAIR (POSSIBLE DERMASPAN PATCH);  Surgeon: Valeria Batman, MD;  Location: [facility];  Service: Orthopedics;  Laterality: Right;  . THYROIDECTOMY     Social History   Occupational  History  . Occupation: Unemployed  Tobacco Use  . Smoking status: Current Every Day Smoker    Packs/day: 0.50    Years: 25.00    Pack years: 12.50    Types: Cigarettes  . Smokeless tobacco: Never Used  . Tobacco comment: Trying to stop smoking  Vaping Use  . Vaping Use: Former  Substance and Sexual Activity  . Alcohol use: No  . Drug use: No  . Sexual activity: Not Currently    Birth control/protection: Surgical    Comment: Partial Hyst

## 2020-03-13 ENCOUNTER — Ambulatory Visit: Payer: Medicaid Other | Admitting: Physical Therapy

## 2020-03-18 ENCOUNTER — Ambulatory Visit: Payer: Medicaid Other | Admitting: Physical Therapy

## 2020-03-20 ENCOUNTER — Ambulatory Visit: Payer: Medicaid Other | Admitting: Physical Therapy

## 2020-03-20 ENCOUNTER — Telehealth: Payer: Self-pay | Admitting: Physical Therapy

## 2020-03-20 NOTE — Telephone Encounter (Signed)
LVM for pt in regards to her missed visit this morning.  Informed pt that she does not have any further appointments scheduled with the clinic and she's due for a re-evaluation. PT asked her to call to make new appointments.    At this point pt has canceled multiple visits due to transportation issues and did not see PT for almost a month because of family deaths requiring her to travel.   Antuane Eastridge April Dell Ponto, PT, DPT

## 2020-04-17 ENCOUNTER — Ambulatory Visit (INDEPENDENT_AMBULATORY_CARE_PROVIDER_SITE_OTHER): Payer: Medicaid Other | Admitting: Orthopaedic Surgery

## 2020-04-17 ENCOUNTER — Other Ambulatory Visit: Payer: Self-pay

## 2020-04-17 ENCOUNTER — Encounter: Payer: Self-pay | Admitting: Orthopaedic Surgery

## 2020-04-17 VITALS — Ht 62.0 in | Wt 155.0 lb

## 2020-04-17 DIAGNOSIS — G8929 Other chronic pain: Secondary | ICD-10-CM

## 2020-04-17 DIAGNOSIS — M25511 Pain in right shoulder: Secondary | ICD-10-CM | POA: Diagnosis not present

## 2020-04-17 NOTE — Progress Notes (Signed)
Office Visit Note   Patient: [name]           Date of Birth: 08/05/1960           MRN: [id number] Visit Date: 04/17/2020              Requested by: Salli Real, MD [address] [place],  [place] [address] PCP: Salli Real, MD   Assessment & Plan: Visit Diagnoses:  1. Chronic right shoulder pain     Plan: 4-1/2 months status post right shoulder surgery including an arthroscopic SCD, DCR and mini open supraspinatus tendon repair and biceps tenodesis.  [name] has developed an adhesive capsulitis despite physical therapy.  After much discussion we will proceed with a closed manipulation.  We will try and do it on a Tuesday so she can have therapy the following day.  Follow-Up Instructions: Return We will schedule closed manipulation right shoulder.   Orders:  No orders of the defined types were placed in this encounter.  No orders of the defined types were placed in this encounter.     Procedures: No procedures performed   Clinical Data: No additional findings.   Subjective: Chief Complaint  Patient presents with  . Right Shoulder - Follow-up    Right shoulder arthroscopy 12/05/2019  Patient presents today for follow up on her right shoulder. She had a right shoulder arthroscopy on 12/05/2019. She is now 4 months out from surgery. Patient states that she is still hurting and has decreased range of motion. She has finished therapy. She is in pain management.  HPI  Review of Systems   Objective: Vital Signs: Ht 5\' 2"  (1.575 m)   Wt 155 lb (70.3 kg)   BMI 28.35 kg/m   Physical Exam  Ortho Exam awake alert and oriented x3.  Comfortable sitting I am able to abduct her right shoulder to about 70 degrees at which point she is uncomfortable and she had about 90 to 100 degrees of flexion.  There was no pain with her arm at her side and with rotation there was no discomfort.  No localized areas of pain.  Good grip and good release. Specialty Comments:  No specialty  comments available.  Imaging: No results found.   PMFS History: Patient Active Problem List   Diagnosis Date Noted  . Hypoxia 12/05/2019  . Diabetic neuropathy ([facility]) 06/02/2018  . H/O: hysterectomy   . Abdominal wall pain   . Shoulder pain, right   . Callus   . Arthropathy   . Fatigue   . Arterial bruit   . Barrett's esophagus   . Ulcer of esophagus without bleeding   . Right foot pain   . Inflammatory bowel disease   . Precordial pain   . Colon polyp   . Chest pain in adult   . Shortness of breath on exertion   . Restless leg syndrome   . Coronary artery disease, occlusive   . Eosinophilic esophagitis   . Gastroesophagitis   . Gastritis   . H. pylori infection   . Inlet patch of esophagus   . PVD (peripheral vascular disease) ([facility])   . Eosinophilic gastritis   . Chronic back pain   . Abdominal pain, right lower quadrant   . Hypersomnia   . Carotid stenosis, bilateral   . CAD (coronary artery disease) 05/27/2017  . Unstable angina ([facility])   . Coronary artery disease 05/25/2017  . Essential hypertension 05/25/2017  . Hyperlipidemia 05/25/2017   Past Medical History:  Diagnosis Date  . Abdominal pain, right lower quadrant   . Abdominal wall pain   . Arterial bruit   . Arthritis   . Arthropathy   . Barrett's esophagus   . Callus   . Carotid stenosis, bilateral   . Chest pain in adult   . Chronic back pain   . Colon polyp   . Coronary artery disease, occlusive    stent x 2  . Depression   . Diabetic neuropathy ([facility]) 06/02/2018   type 2  . Eosinophilic esophagitis   . Eosinophilic gastritis   . Fatigue   . Gastritis   . Gastroesophagitis   . GERD (gastroesophageal reflux disease)   . H. pylori infection   . H/O: hysterectomy   . Hypersomnia   . IBS (irritable bowel syndrome)   . Inflammatory bowel disease    colitis  . Inlet patch of esophagus   . Myocardial infarction ([facility])   . Pneumonia   . Precordial pain   . PVD (peripheral vascular disease)  ([facility])    pt. denies  . Restless leg syndrome   . Right foot pain   . Shortness of breath on exertion   . Shoulder pain, right   . Ulcer of esophagus without bleeding     Family History  Problem Relation Age of Onset  . Hyperlipidemia Mother   . Hypertension Mother   . Diabetes Mellitus II Mother   . Cancer Father   . Hypertension Sister   . Hypertension Brother   . Hypertension Brother   . Hypertension Brother   . Hypertension Sister   . Hypertension Sister   . Heart disease Maternal Aunt     Past Surgical History:  Procedure Laterality Date  . AUGMENTATION MAMMAPLASTY Bilateral    breast lift  . BREAST EXCISIONAL BIOPSY Right   . CORONARY STENT INTERVENTION N/A 05/27/2017   Procedure: CORONARY STENT INTERVENTION;  Surgeon: Runell Gess, MD;  [facility];  Service: Cardiovascular;  Laterality: N/A;  . EYE SURGERY     laser surgery for hole in eye  . FOOT SURGERY Left   . INTRAVASCULAR PRESSURE WIRE/FFR STUDY N/A 05/27/2017   Procedure: INTRAVASCULAR PRESSURE WIRE/FFR STUDY;  Surgeon: Runell Gess, MD;  [facility];  Service: Cardiovascular;  Laterality: N/A;  . LEFT HEART CATH AND CORONARY ANGIOGRAPHY N/A 05/27/2017   Procedure: LEFT HEART CATH AND CORONARY ANGIOGRAPHY;  Surgeon: Runell Gess, MD;  [facility];  Service: Cardiovascular;  Laterality: N/A;  . SHOULDER ARTHROSCOPY WITH DISTAL CLAVICLE RESECTION Right 12/05/2019   Procedure: SHOULDER ARTHROSCOPY, SUBACROMIAL DECOMPRESSION AND DISTAL CLAVICLE RESECTION, MINI OPEN ROTATOR CUFF REPAIR (POSSIBLE DERMASPAN PATCH);  Surgeon: Valeria Batman, MD;  Location: [facility];  Service: Orthopedics;  Laterality: Right;  . THYROIDECTOMY     Social History   Occupational History  . Occupation: Unemployed  Tobacco Use  . Smoking status: Current Every Day Smoker    Packs/day: 0.50    Years: 25.00    Pack years: 12.50    Types: Cigarettes  . Smokeless tobacco:  Never Used  . Tobacco comment: Trying to stop smoking  Vaping Use  . Vaping Use: Former  Substance and Sexual Activity  . Alcohol use: No  . Drug use: No  . Sexual activity: Not Currently    Birth control/protection: Surgical    Comment: Partial Hyst

## 2020-05-13 ENCOUNTER — Other Ambulatory Visit: Payer: Self-pay | Admitting: Internal Medicine

## 2020-05-13 DIAGNOSIS — Z1231 Encounter for screening mammogram for malignant neoplasm of breast: Secondary | ICD-10-CM

## 2020-05-15 ENCOUNTER — Other Ambulatory Visit: Payer: Self-pay

## 2020-05-16 ENCOUNTER — Other Ambulatory Visit: Payer: Self-pay

## 2020-05-16 ENCOUNTER — Encounter (HOSPITAL_BASED_OUTPATIENT_CLINIC_OR_DEPARTMENT_OTHER): Payer: Self-pay | Admitting: Orthopaedic Surgery

## 2020-05-16 NOTE — H&P (Signed)
[name], MD   [name], PA-C [address], [place], [place]  [address]                             [phone]   ORTHOPAEDIC HISTORY & PHYSICAL  [name] MRN:  [id number] DOB/SEX:  08-Jul-1961/female  CHIEF COMPLAINT:  FROZEN RIGHT SHOULDER  HISTORY: [name] is now 4-1/2 months status post right shoulder surgery including an arthroscopic SCD, DCR and mini open supraspinatus tendon repair and biceps tenodesis.  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  After much discussion we will proceed with a closed manipulation.   PAST MEDICAL HISTORY: Patient Active Problem List   Diagnosis Date Noted  . Hypoxia 12/05/2019  . Diabetic neuropathy (HCC) 06/02/2018  . H/O: hysterectomy   . Abdominal wall pain   . Shoulder pain, right   . Callus   . Arthropathy   . Fatigue   . Arterial bruit   . Barrett's esophagus   . Ulcer of esophagus without bleeding   . Right foot pain   . Inflammatory bowel disease   . Precordial pain   . Colon polyp   . Chest pain in adult   . Shortness of breath on exertion   . Restless leg syndrome   . Coronary artery disease, occlusive   . Eosinophilic esophagitis   . Gastroesophagitis   . Gastritis   . H. pylori infection   . Inlet patch of esophagus   . PVD (peripheral vascular disease) (HCC)   . Eosinophilic gastritis   . Chronic back pain   . Abdominal pain, right lower quadrant   . Hypersomnia   . Carotid stenosis, bilateral   . CAD (coronary artery disease) 05/27/2017  . Unstable angina (HCC)   . Coronary artery disease 05/25/2017  . Essential hypertension 05/25/2017  . Hyperlipidemia 05/25/2017   Past Medical History:  Diagnosis Date  . Abdominal pain, right lower quadrant   . Abdominal wall pain   . Arterial bruit   . Arthritis   . Arthropathy   . Barrett's esophagus   . Callus   . Carotid stenosis, bilateral   . Chest pain in adult   . Chronic back pain   . Colon polyp     . Coronary artery disease, occlusive    stent x 2  . Depression   . Diabetic neuropathy (HCC) 06/02/2018   type 2  . Eosinophilic esophagitis   . Eosinophilic gastritis   . Fatigue   . Gastritis   . Gastroesophagitis   . GERD (gastroesophageal reflux disease)   . H. pylori infection   . H/O: hysterectomy   . Hypersomnia   . IBS (irritable bowel syndrome)   . Inflammatory bowel disease    colitis  . Inlet patch of esophagus   . Myocardial infarction (HCC)   . Pneumonia   . Precordial pain   . PVD (peripheral vascular disease) (HCC)    pt. denies  . Restless leg syndrome   . Right foot pain   . Shortness of breath on exertion   . Shoulder pain, right   . Ulcer of esophagus without bleeding    Past Surgical History:  Procedure Laterality Date  . ABDOMINAL HYSTERECTOMY    . AUGMENTATION MAMMAPLASTY Bilateral    breast lift  . BREAST EXCISIONAL BIOPSY Right   . CORONARY STENT INTERVENTION N/A 05/27/2017   Procedure: CORONARY STENT INTERVENTION;  Surgeon:  Runell GessBerry, Jonathan J, MD;  Location: [facility];  Service: Cardiovascular;  Laterality: N/A;  . EYE SURGERY     laser surgery for hole in eye  . FOOT SURGERY Left   . INTRAVASCULAR PRESSURE WIRE/FFR STUDY N/A 05/27/2017   Procedure: INTRAVASCULAR PRESSURE WIRE/FFR STUDY;  Surgeon: Runell GessBerry, Jonathan J, MD;  Location: [facility];  Service: Cardiovascular;  Laterality: N/A;  . LEFT HEART CATH AND CORONARY ANGIOGRAPHY N/A 05/27/2017   Procedure: LEFT HEART CATH AND CORONARY ANGIOGRAPHY;  Surgeon: Runell GessBerry, Jonathan J, MD;  Location: [facility];  Service: Cardiovascular;  Laterality: N/A;  . SHOULDER ARTHROSCOPY WITH DISTAL CLAVICLE RESECTION Right 12/05/2019   Procedure: SHOULDER ARTHROSCOPY, SUBACROMIAL DECOMPRESSION AND DISTAL CLAVICLE RESECTION, MINI OPEN ROTATOR CUFF REPAIR (POSSIBLE DERMASPAN PATCH);  Surgeon: Valeria BatmanWhitfield, Peter W, MD;  Location: [facility];  Service: Orthopedics;  Laterality: Right;  .  THYROIDECTOMY       MEDICATIONS PRIOR TO ADMISSION: No current facility-administered medications for this encounter.  Current Outpatient Medications:  .  albuterol (PROAIR HFA) 108 (90 Base) MCG/ACT inhaler, Inhale 2 puffs into the lungs every 6 (six) hours as needed for wheezing or shortness of breath., Disp: , Rfl:  .  amLODipine (NORVASC) 10 MG tablet, Take 10 mg by mouth daily., Disp: , Rfl:  .  aspirin EC 81 MG tablet, Take 81 mg by mouth every 3 (three) days. , Disp: , Rfl:  .  atorvastatin (LIPITOR) 20 MG tablet, Take 20 mg by mouth daily., Disp: , Rfl: 2 .  baclofen (LIORESAL) 20 MG tablet, Take 20 mg by mouth 3 (three) times daily., Disp: , Rfl: 2 .  carvedilol (COREG) 6.25 MG tablet, Take 6.25 mg by mouth 2 (two) times daily., Disp: , Rfl: 0 .  clopidogrel (PLAVIX) 75 MG tablet, Take 1 tablet (75 mg total) by mouth daily. Please schedule annual appt with Dr. Allyson SabalBerry for refills. [phone]. 1st attempt., Disp: 30 tablet, Rfl: 0 .  esomeprazole (NEXIUM) 40 MG capsule, Take 40 mg by mouth daily. , Disp: , Rfl: 12 .  HYDROcodone-acetaminophen (NORCO) 7.5-325 MG tablet, Take 1 tablet by mouth 3 (three) times daily as needed for moderate pain., Disp: 40 tablet, Rfl: 0 .  levothyroxine (SYNTHROID) 112 MCG tablet, Take 112 mcg by mouth daily. , Disp: , Rfl:  .  lisinopril (PRINIVIL,ZESTRIL) 20 MG tablet, Take 20 mg by mouth daily. , Disp: , Rfl:  .  metFORMIN (GLUCOPHAGE) 1000 MG tablet, Take 1,000 mg by mouth 2 (two) times daily., Disp: , Rfl: 3 .  Multiple Vitamin (MULTIVITAMIN WITH MINERALS) TABS tablet, Take 1 tablet by mouth daily., Disp: , Rfl:  .  traZODone (DESYREL) 100 MG tablet, Take 300 mg by mouth at bedtime. , Disp: , Rfl: 2 .  TRESIBA FLEXTOUCH 200 UNIT/ML SOPN, Inject 60 Units as directed daily. , Disp: , Rfl: 2 .  nitroGLYCERIN (NITROSTAT) 0.4 MG SL tablet, Place 1 tablet (0.4 mg total) under the tongue every 5 (five) minutes as needed for chest pain., Disp: 25 tablet, Rfl:  12 .  oxycodone (OXY-IR) 5 MG capsule, Take 1 capsule (5 mg total) by mouth every 6 (six) hours as needed., Disp: 30 capsule, Rfl: 0   ALLERGIES:   Allergies  Allergen Reactions  . Motrin [Ibuprofen] Hives  . Bactrim [Sulfamethoxazole-Trimethoprim] Rash  . Sulfa Antibiotics Hives and Rash    REVIEW OF SYSTEMS:  Back pain, leg swelling  FAMILY HISTORY:   Family History  Problem Relation Age of  Onset  . Hyperlipidemia Mother   . Hypertension Mother   . Diabetes Mellitus II Mother   . Cancer Father   . Hypertension Sister   . Hypertension Brother   . Hypertension Brother   . Hypertension Brother   . Hypertension Sister   . Hypertension Sister   . Heart disease Maternal Aunt     SOCIAL HISTORY:   Social History   Occupational History  . Occupation: Unemployed  Tobacco Use  . Smoking status: Current Every Day Smoker    Packs/day: 0.50    Years: 25.00    Pack years: 12.50    Types: Cigarettes  . Smokeless tobacco: Never Used  . Tobacco comment: Trying to stop smoking  Vaping Use  . Vaping Use: Former  Substance and Sexual Activity  . Alcohol use: No  . Drug use: No  . Sexual activity: Not Currently    Birth control/protection: Surgical    Comment: Partial Hyst      EXAMINATION:  Vital signs in last 24 hours: Ht 5\' 2"  (1.575 m)   Wt 68.9 kg   BMI 27.80 kg/m   Physical Exam Constitutional:      Appearance: Normal appearance. [name] is well-developed.  HENT:     Head: Normocephalic and atraumatic.     Nose: Nose normal.     Mouth/Throat:     Mouth: Mucous membranes are moist.  Eyes:     Pupils: Pupils are equal, round, and reactive to light.  Cardiovascular:     Rate and Rhythm: Normal rate and regular rhythm.     Pulses: Normal pulses.  Pulmonary:     Effort: Pulmonary effort is normal.  Abdominal:     Palpations: Abdomen is soft.  Skin:    General: Skin is warm and dry.  Neurological:     Mental Status: [name] is alert and oriented to person, place,  and time.  Psychiatric:        Behavior: Behavior normal.      Ortho Exam  able to abduct her right shoulder to about 70 degrees at which point [name] is uncomfortable and [name] had about 90 to 100 degrees of flexion.     I  ASSESSMENT: Arthrofibrosis right shoulder  Past Medical History:  Diagnosis Date  . Abdominal pain, right lower quadrant   . Abdominal wall pain   . Arterial bruit   . Arthritis   . Arthropathy   . Barrett's esophagus   . Callus   . Carotid stenosis, bilateral   . Chest pain in adult   . Chronic back pain   . Colon polyp   . Coronary artery disease, occlusive    stent x 2  . Depression   . Diabetic neuropathy (HCC) 06/02/2018   type 2  . Eosinophilic esophagitis   . Eosinophilic gastritis   . Fatigue   . Gastritis   . Gastroesophagitis   . GERD (gastroesophageal reflux disease)   . H. pylori infection   . H/O: hysterectomy   . Hypersomnia   . IBS (irritable bowel syndrome)   . Inflammatory bowel disease    colitis  . Inlet patch of esophagus   . Myocardial infarction (HCC)   . Pneumonia   . Precordial pain   . PVD (peripheral vascular disease) (HCC)    pt. denies  . Restless leg syndrome   . Right foot pain   . Shortness of breath on exertion   . Shoulder pain, right   . Ulcer of esophagus  without bleeding     PLAN: Manipulation right shoulder  Oris Drone. Daphine Deutscher [phone]  05/16/2020 5:30 PM

## 2020-05-17 ENCOUNTER — Other Ambulatory Visit (HOSPITAL_COMMUNITY)
Admission: RE | Admit: 2020-05-17 | Discharge: 2020-05-17 | Disposition: A | Discharge status: Home / Self Care | Payer: Medicaid Other | Source: Ambulatory Visit | Attending: Orthopaedic Surgery | Admitting: Orthopaedic Surgery

## 2020-05-17 ENCOUNTER — Telehealth: Payer: Self-pay

## 2020-05-17 DIAGNOSIS — Z01812 Encounter for preprocedural laboratory examination: Secondary | ICD-10-CM | POA: Insufficient documentation

## 2020-05-17 DIAGNOSIS — Z20822 Contact with and (suspected) exposure to covid-19: Secondary | ICD-10-CM | POA: Diagnosis not present

## 2020-05-17 LAB — SARS CORONAVIRUS 2 (TAT 6-24 HRS): SARS Coronavirus 2: NEGATIVE

## 2020-05-17 NOTE — Telephone Encounter (Signed)
[name] can you help with this ?

## 2020-05-17 NOTE — Telephone Encounter (Signed)
sheena from [facility] said patient advised she needed test done before surgery , caller wants to know what labs need to be done . May not get results in time.   [phone]  ext [phone]

## 2020-05-20 ENCOUNTER — Telehealth: Payer: Self-pay | Admitting: Orthopaedic Surgery

## 2020-05-20 NOTE — Progress Notes (Signed)
Spoke with patient. She states Dr. Dyke Brackett office called her and stated she needed a stress test done prior to surgery. Instructed pt to call and schedule that test and to call Dr. Hoy Register office.  I notified Debbie at Dr. Hoy Register office.

## 2020-05-20 NOTE — Telephone Encounter (Signed)
thanks

## 2020-05-20 NOTE — Telephone Encounter (Signed)
Patient's shoulder manipulation at [facility] for tomorrow is cancelled.  Patient called to say Dr. Hanley Hays would like for her to have a stress test prior.  Patient will call back to reschedule once she has been cleared.

## 2020-05-21 ENCOUNTER — Ambulatory Visit (HOSPITAL_BASED_OUTPATIENT_CLINIC_OR_DEPARTMENT_OTHER): Admission: RE | Admit: 2020-05-21 | Payer: Medicaid Other | Source: Home / Self Care | Admitting: Orthopaedic Surgery

## 2020-05-21 SURGERY — MANIPULATION, JOINT, SHOULDER, WITH ANESTHESIA
Anesthesia: Choice | Site: Shoulder | Laterality: Right

## 2020-05-28 ENCOUNTER — Inpatient Hospital Stay: Payer: Medicaid Other | Admitting: Orthopaedic Surgery

## 2020-06-05 ENCOUNTER — Ambulatory Visit
Admission: RE | Admit: 2020-06-05 | Discharge: 2020-06-05 | Disposition: A | Discharge status: Home / Self Care | Payer: Medicaid Other | Source: Ambulatory Visit | Attending: Internal Medicine | Admitting: Internal Medicine

## 2020-06-05 ENCOUNTER — Other Ambulatory Visit: Payer: Self-pay

## 2020-06-05 DIAGNOSIS — Z1231 Encounter for screening mammogram for malignant neoplasm of breast: Secondary | ICD-10-CM

## 2020-08-03 HISTORY — PX: CATARACT EXTRACTION, BILATERAL: SHX1313

## 2020-08-22 NOTE — Progress Notes (Signed)
COVID Vaccine Completed:  x1 Date COVID Vaccine completed:  11-13-19  COVID vaccine manufacturer: Pfizer    Moderna   [employer]   PCP - Salli Real, MD Cardiologist - Nanetta Batty, MD.  Last OV 2018 ?  Dr. Hanley Hays  Chest x-ray -  EKG -  Stress Test - 04-17-19 in Epic ECHO - 04-06-17 in Epic Cardiac Cath - 05-27-2017 in Epic Pacemaker/ICD device last checked:  Sleep Study -  CPAP -   Fasting Blood Sugar -  Checks Blood Sugar _____ times a day  Blood Thinner Instructions:  Plavix 75 mg Aspirin Instructions:  ASA 81 mg Last Dose:  Anesthesia review:  CAD, unstable angina, PVD, carotid stenosis.  Hx of MI with stents, HTN, DM  Patient denies shortness of breath, fever, cough and chest pain at PAT appointment   Patient verbalized understanding of instructions that were given to them at the PAT appointment. Patient was also instructed that they will need to review over the PAT instructions again at home before surgery.

## 2020-08-22 NOTE — Progress Notes (Signed)
Spoke to patient today and she states that she will be rescheduling 08/27/20 surgery.  She has left a message at Dr. Hoy Register office.

## 2020-08-22 NOTE — Patient Instructions (Signed)
DUE TO COVID-19 ONLY ONE VISITOR IS ALLOWED TO COME WITH YOU AND STAY IN THE WAITING ROOM ONLY DURING PRE OP AND PROCEDURE.   IF YOU WILL BE ADMITTED INTO THE HOSPITAL YOU ARE ALLOWED ONE SUPPORT PERSON DURING VISITATION HOURS ONLY (10AM -8PM)   . The support person may change daily. . The support person must pass our screening, gel in and out, and wear a mask at all times, including in the patient's room. . Patients must also wear a mask when staff or their support person are in the room.   COVID SWAB TESTING MUST BE COMPLETED ON:   Friday, 08-23-20 @    [address] [place] [id number]  (Must self quarantine after testing. Follow instructions on handout.)  [facility] Entrance [address] (Must self quarantine after testing. Follow instructions on handout.)       Your procedure is scheduled on:    Report to [facility] Main  Entr[facility]   Report to Short Stay at 5:30 AM   [facility](Map Enclosed)    Report to admitting at AM   Call this number if you have problems the morning of surgery [phone]   Do not eat food :After Midnight.   May have liquids until    day of surgery  CLEAR LIQUID DIET  Foods Allowed                                                                     Foods Excluded  Water, Black Coffee and tea, regular and decaf                             liquids that you cannot  Plain Jell-O in any flavor  (No red)                                           see through such as: Fruit ices (not with fruit pulp)                                     milk, soups, orange juice              Iced Popsicles (No red)                                    All solid food                                   Apple juices Sports drinks like Gatorade (No red) Lightly seasoned clear broth or consume(fat free) Sugar, honey syrup  Sample Menu Breakfast                                Lunch  Supper Cranberry juice                    Beef broth                            Chicken broth Jell-O                                     Grape juice                           Apple juice Coffee or tea                        Jell-O                                      Popsicle                                                Coffee or tea                        Coffee or tea      Complete one Ensure drink the morning of surgery at       the day of surgery.   Drink 2 Ensure drinks the night before surgery.  Complete one Ensure drink the morning of surgery 3 hours prior to scheduled surgery.     1. The day of surgery:  ? Drink ONE (1) Pre-Surgery Clear Ensure or G2 by am the morning of surgery. Drink in one sitting. Do not sip.  ? This drink was given to you during your hospital  pre-op appointment visit. ? Nothing else to drink after completing the  Pre-Surgery Clear Ensure or G2.          If you have questions, please contact your surgeon's office.     Oral Hygiene is also important to reduce your risk of infection.                                    Remember - BRUSH YOUR TEETH THE MORNING OF SURGERY WITH YOUR REGULAR TOOTHPASTE   Do NOT smoke after Midnight   Take these medicines the morning of surgery with A SIP OF WATER:   How to Manage Your Diabetes Before and After Surgery  Why is it important to control my blood sugar before and after surgery? . Improving blood sugar levels before and after surgery helps healing and can limit problems. . A way of improving blood sugar control is eating a healthy diet by: o  Eating less sugar and carbohydrates o  Increasing activity/exercise o  Talking with your doctor about reaching your blood sugar goals . High blood sugars (greater than 180 mg/dL) can raise your risk of infections and slow your recovery, so you will need to focus on controlling your diabetes during the weeks before surgery. . Make sure that the doctor who takes care of  your diabetes knows about your planned  surgery including the date and location.  How do I manage my blood sugar before surgery? . Check your blood sugar at least 4 times a day, starting 2 days before surgery, to make sure that the level is not too high or low. o Check your blood sugar the morning of your surgery when you wake up and every 2 hours until you get to the Short Stay unit. . If your blood sugar is less than 70 mg/dL, you will need to treat for low blood sugar: o Do not take insulin. o Treat a low blood sugar (less than 70 mg/dL) with  cup of clear juice (cranberry or apple), 4 glucose tablets, OR glucose gel. o Recheck blood sugar in 15 minutes after treatment (to make sure it is greater than 70 mg/dL). If your blood sugar is not greater than 70 mg/dL on recheck, call [phone] for further instructions. . Report your blood sugar to the short stay nurse when you get to Short Stay.  . If you are admitted to the hospital after surgery: o Your blood sugar will be checked by the staff and you will probably be given insulin after surgery (instead of oral diabetes medicines) to make sure you have good blood sugar levels. o The goal for blood sugar control after surgery is 80-180 mg/dL.   WHAT DO I DO ABOUT MY DIABETES MEDICATION?  Marland Kitchen Do not take oral diabetes medicines (pills) the morning of surgery.  . THE NIGHT BEFORE SURGERY, take     units of       insulin.       . THE MORNING OF SURGERY, take   units of         insulin.  . The day of surgery, do not take other diabetes injectables, including Byetta (exenatide), Bydureon (exenatide ER), Victoza (liraglutide), or Trulicity (dulaglutide).  . If your CBG is greater than 220 mg/dL, you may take  of your sliding scale  . (correction) dose of insulin.    For patients with insulin pumps: Contact your diabetes doctor for specific instructions before surgery. Decrease basal rates by 20% at midnight the night before your  surgery. Note that if your surgery is planned to be longer than 2 hours, your insulin pump will be removed and intravenous (IV) insulin will be started and managed by the nurses and the anesthesiologist. You will be able to restart your insulin pump once you are awake and able to manage it.  Make sure to bring insulin pump supplies to the hospital with you in case the  site needs to be changed.  Patient Signature:  Date:   Nurse Signature:  Date:   Reviewed and Endorsed by [facility] Patient Education Committee, August 2015                              You may not have any metal on your body including hair pins, jewelry, and body piercings             Do not wear make-up, lotions, powders, perfumes/cologne, or deodorant             Do not wear nail polish.  Do not shave  48 hours prior to surgery.              Men may shave face and neck.   Do not bring valuables to the hospital. [facility] IS NOT  RESPONSIBLE   FOR VALUABLES.   Contacts, dentures or bridgework may not be worn into surgery.   Bring small overnight bag day of surgery.    Patients discharged the day of surgery will not be allowed to drive home.   Special Instructions: Bring a copy of your healthcare power of attorney and living will documents         the day of surgery if you haven't scanned them in before.              Please read over the following fact sheets you were given: IF YOU HAVE QUESTIONS ABOUT YOUR PRE OP INSTRUCTIONS PLEASE CALL [phone]   [place] - Preparing for Surgery Before surgery, you can play an important role.  Because skin is not sterile, your skin needs to be as free of germs as possible.  You can reduce the number of germs on your skin by washing with CHG (chlorahexidine gluconate) soap before surgery.  CHG is an antiseptic cleaner which kills germs and bonds with the skin to continue killing germs even after washing. Please DO NOT use if you have an allergy to CHG or  antibacterial soaps.  If your skin becomes reddened/irritated stop using the CHG and inform your nurse when you arrive at Short Stay. Do not shave (including legs and underarms) for at least 48 hours prior to the first CHG shower.  You may shave your face/neck.  Please follow these instructions carefully:  1.  Shower with CHG Soap the night before surgery and the  morning of surgery.  2.  If you choose to wash your hair, wash your hair first as usual with your normal  shampoo.  3.  After you shampoo, rinse your hair and body thoroughly to remove the shampoo.                             4.  Use CHG as you would any other liquid soap.  You can apply chg directly to the skin and wash.  Gently with a scrungie or clean washcloth.  5.  Apply the CHG Soap to your body ONLY FROM THE NECK DOWN.   Do   not use on face/ open                           Wound or open sores. Avoid contact with eyes, ears mouth and   genitals (private parts).                       Wash face,  Genitals (private parts) with your normal soap.             6.  Wash thoroughly, paying special attention to the area where your    surgery  will be performed.  7.  Thoroughly rinse your body with warm water from the neck down.  8.  DO NOT shower/wash with your normal soap after using and rinsing off the CHG Soap.                9.  Pat yourself dry with a clean towel.            10.  Wear clean pajamas.            11.  Place clean sheets on your bed the night of your first shower and do not  sleep with pets. Day  of Surgery : Do not apply any lotions/deodorants the morning of surgery.  Please wear clean clothes to the hospital/surgery center.  FAILURE TO FOLLOW THESE INSTRUCTIONS MAY RESULT IN THE CANCELLATION OF YOUR SURGERY  PATIENT SIGNATURE_________________________________  NURSE SIGNATURE__________________________________  ________________________________________________________________________   Rogelia Mire  An  incentive spirometer is a tool that can help keep your lungs clear and active. This tool measures how well you are filling your lungs with each breath. Taking long deep breaths may help reverse or decrease the chance of developing breathing (pulmonary) problems (especially infection) following:  A long period of time when you are unable to move or be active. BEFORE THE PROCEDURE   If the spirometer includes an indicator to show your best effort, your nurse or respiratory therapist will set it to a desired goal.  If possible, sit up straight or lean slightly forward. Try not to slouch.  Hold the incentive spirometer in an upright position. INSTRUCTIONS FOR USE  1. Sit on the edge of your bed if possible, or sit up as far as you can in bed or on a chair. 2. Hold the incentive spirometer in an upright position. 3. Breathe out normally. 4. Place the mouthpiece in your mouth and seal your lips tightly around it. 5. Breathe in slowly and as deeply as possible, raising the piston or the ball toward the top of the column. 6. Hold your breath for 3-5 seconds or for as long as possible. Allow the piston or ball to fall to the bottom of the column. 7. Remove the mouthpiece from your mouth and breathe out normally. 8. Rest for a few seconds and repeat Steps 1 through 7 at least 10 times every 1-2 hours when you are awake. Take your time and take a few normal breaths between deep breaths. 9. The spirometer may include an indicator to show your best effort. Use the indicator as a goal to work toward during each repetition. 10. After each set of 10 deep breaths, practice coughing to be sure your lungs are clear. If you have an incision (the cut made at the time of surgery), support your incision when coughing by placing a pillow or rolled up towels firmly against it. Once you are able to get out of bed, walk around indoors and cough well. You may stop using the incentive spirometer when instructed by your  caregiver.  RISKS AND COMPLICATIONS  Take your time so you do not get dizzy or light-headed.  If you are in pain, you may need to take or ask for pain medication before doing incentive spirometry. It is harder to take a deep breath if you are having pain. AFTER USE  Rest and breathe slowly and easily.  It can be helpful to keep track of a log of your progress. Your caregiver can provide you with a simple table to help with this. If you are using the spirometer at home, follow these instructions: SEEK MEDICAL CARE IF:   You are having difficultly using the spirometer.  You have trouble using the spirometer as often as instructed.  Your pain medication is not giving enough relief while using the spirometer.  You develop fever of 100.5 F (38.1 C) or higher. SEEK IMMEDIATE MEDICAL CARE IF:   You cough up bloody sputum that had not been present before.  You develop fever of 102 F (38.9 C) or greater.  You develop worsening pain at or near the incision site. MAKE SURE YOU:   Understand these instructions.  Will watch your condition.  Will get help right away if you are not doing well or get worse. Document Released: 11/30/2006 Document Revised: 10/12/2011 Document Reviewed: 01/31/2007 [facility] Patient Information 2014 [place], [place].   ________________________________________________________________________

## 2020-08-23 ENCOUNTER — Other Ambulatory Visit (HOSPITAL_COMMUNITY): Payer: Medicaid Other

## 2020-08-23 ENCOUNTER — Encounter (HOSPITAL_COMMUNITY)
Admission: RE | Admit: 2020-08-23 | Discharge: 2020-08-23 | Disposition: A | Discharge status: Home / Self Care | Payer: Medicaid Other | Source: Ambulatory Visit | Attending: Anesthesiology | Admitting: Anesthesiology

## 2020-08-25 ENCOUNTER — Telehealth: Payer: Self-pay | Admitting: Orthopaedic Surgery

## 2020-08-25 NOTE — Telephone Encounter (Signed)
Patient called to cancel her right shoulder procedure (manipulation) for Jan 25th at [facility]. She said there is a lot going on and she would rather wait.  She will call when she is ready to reschedule.

## 2020-08-26 NOTE — Telephone Encounter (Signed)
thanks

## 2020-08-26 NOTE — Telephone Encounter (Signed)
FYI

## 2020-08-27 ENCOUNTER — Encounter (HOSPITAL_COMMUNITY): Admission: RE | Payer: Self-pay | Source: Home / Self Care

## 2020-08-27 ENCOUNTER — Ambulatory Visit (HOSPITAL_COMMUNITY): Admission: RE | Admit: 2020-08-27 | Payer: Medicaid Other | Source: Home / Self Care | Admitting: Orthopaedic Surgery

## 2020-08-27 SURGERY — MANIPULATION, JOINT, SHOULDER, WITH ANESTHESIA
Anesthesia: Choice | Site: Shoulder | Laterality: Right

## 2021-05-19 ENCOUNTER — Other Ambulatory Visit: Payer: Self-pay | Admitting: Internal Medicine

## 2021-05-19 DIAGNOSIS — Z1231 Encounter for screening mammogram for malignant neoplasm of breast: Secondary | ICD-10-CM

## 2021-06-17 ENCOUNTER — Ambulatory Visit: Payer: Medicaid Other

## 2021-06-25 ENCOUNTER — Other Ambulatory Visit: Payer: Self-pay

## 2021-06-25 ENCOUNTER — Ambulatory Visit
Admission: RE | Admit: 2021-06-25 | Discharge: 2021-06-25 | Disposition: A | Discharge status: Home / Self Care | Payer: Medicaid Other | Source: Ambulatory Visit | Attending: Internal Medicine | Admitting: Internal Medicine

## 2021-06-25 DIAGNOSIS — Z1231 Encounter for screening mammogram for malignant neoplasm of breast: Secondary | ICD-10-CM

## 2022-02-06 ENCOUNTER — Ambulatory Visit: Payer: Medicaid Other | Admitting: Podiatry

## 2022-02-06 DIAGNOSIS — B351 Tinea unguium: Secondary | ICD-10-CM

## 2022-02-06 DIAGNOSIS — M79675 Pain in left toe(s): Secondary | ICD-10-CM

## 2022-02-06 DIAGNOSIS — M79674 Pain in right toe(s): Secondary | ICD-10-CM | POA: Diagnosis not present

## 2022-02-06 DIAGNOSIS — E1142 Type 2 diabetes mellitus with diabetic polyneuropathy: Secondary | ICD-10-CM

## 2022-02-06 MED ORDER — CICLOPIROX 8 % EX SOLN: Freq: Every day | CUTANEOUS | 0 refills | Status: DC

## 2022-02-06 NOTE — Progress Notes (Unsigned)
  Subjective:  Patient ID: [name], female    DOB: 07-18-1961,  MRN: [id number]  Chief Complaint  Patient presents with   Callouses    Corns and callosities    Diabetes    New Patient)Diagnosis: L84 (ICD-10-CM) - Corns and callosities E11.9 (ICD-10-CM) - Type 2 diabetes mellitus without complications Referring Provider: Salli Real   61 y.o. female returns for the above complaint.  Patient presents with thickened elongated dystrophic toenails x10.  Patient is diabetic last A1c of 6.9.  Patient states it hurts for her to do.  She would like for me to debride well.  She denies any other acute complaints  Objective:  There were no vitals filed for this visit. Podiatric Exam: Vascular: dorsalis pedis and posterior tibial pulses are palpable bilateral. Capillary return is immediate. Temperature gradient is WNL. Skin turgor WNL  Sensorium: Normal Semmes Weinstein monofilament test. Normal tactile sensation bilaterally. Nail Exam: Pt has thick disfigured discolored nails with subungual debris noted bilateral entire nail hallux through fifth toenails.  Pain on palpation to the nails. Ulcer Exam: There is no evidence of ulcer or pre-ulcerative changes or infection. Orthopedic Exam: Muscle tone and strength are WNL. No limitations in general ROM. No crepitus or effusions noted.  Skin: No Porokeratosis. No infection or ulcers    Assessment & Plan:   1. Pain due to onychomycosis of toenails of both feet   2. Diabetic polyneuropathy associated with type 2 diabetes mellitus (HCC)     Patient was evaluated and treated and all questions answered.  Onychomycosis with pain  -Nails palliatively debrided as below. -Educated on self-care  Procedure: Nail Debridement Rationale: pain  Type of Debridement: manual, sharp debridement. Instrumentation: Nail nipper, rotary burr. Number of Nails: 10  Procedures and Treatment: Consent by patient was obtained for treatment procedures. The patient  understood the discussion of treatment and procedures well. All questions were answered thoroughly reviewed. Debridement of mycotic and hypertrophic toenails, 1 through 5 bilateral and clearing of subungual debris. No ulceration, no infection noted.  Return Visit-Office Procedure: Patient instructed to return to the office for a follow up visit 3 months for continued evaluation and treatment.  Nicholes Rough, DPM    No follow-ups on file.

## 2022-03-24 ENCOUNTER — Telehealth: Payer: Self-pay | Admitting: *Deleted

## 2022-03-24 NOTE — Telephone Encounter (Signed)
[name] , case Production designer, theatre/television/film w/ Comm. [phone]) is calling for status on diabetic shoe ordering for patient. Returned the call to patient to explain that the diabetic shoes are a medicare program only, if she has some place in mind that will accept medicaid, will send a prescription,can discuss with physician at next appointment as well, verbalized understanding. Returned the call to her case manager to explain, left message to call back if she had suggestions on places that will accept medicaid

## 2022-04-22 ENCOUNTER — Ambulatory Visit (INDEPENDENT_AMBULATORY_CARE_PROVIDER_SITE_OTHER): Payer: Medicaid Other

## 2022-04-22 ENCOUNTER — Ambulatory Visit: Payer: Medicaid Other | Admitting: Podiatry

## 2022-04-22 DIAGNOSIS — M216X2 Other acquired deformities of left foot: Secondary | ICD-10-CM

## 2022-04-22 DIAGNOSIS — L603 Nail dystrophy: Secondary | ICD-10-CM | POA: Diagnosis not present

## 2022-04-22 NOTE — Progress Notes (Signed)
Subjective:  Patient ID: [name], female    DOB: 1961/02/29,  MRN: [id number]  Chief Complaint  Patient presents with   Nail Problem    61 y.o. female presents with the above complaint.  Patient presents with left submetatarsal 5 porokeratotic lesion with plantarflexed metatarsal.  Patient states the pain for touch is progressive gotten worse.  She would like to discuss surgical options at this time.  She also has a right hallux thickened elongated dystrophic nail she would like to have it removed and made prominent.  S she is failed all conservative.  She would like to discuss surgical options at this time.   Review of Systems: Negative except as noted in the HPI. Denies N/V/F/Ch.  Past Medical History:  Diagnosis Date   Abdominal pain, right lower quadrant    Abdominal wall pain    Arterial bruit    Arthritis    Arthropathy    Barrett's esophagus    Callus    Carotid stenosis, bilateral    Chest pain in adult    Chronic back pain    Colon polyp    Coronary artery disease, occlusive    stent x 2   Depression    Diabetic neuropathy ([place]) 06/02/2018   type 2   Eosinophilic esophagitis    Eosinophilic gastritis    Fatigue    Gastritis    Gastroesophagitis    GERD (gastroesophageal reflux disease)    H. pylori infection    H/O: hysterectomy    Hypersomnia    IBS (irritable bowel syndrome)    Inflammatory bowel disease    colitis   Inlet patch of esophagus    Myocardial infarction (HCC)    Pneumonia    Precordial pain    PVD (peripheral vascular disease) ([place])    pt. denies   Restless leg syndrome    Right foot pain    Shortness of breath on exertion    Shoulder pain, right    Ulcer of esophagus without bleeding     Current Outpatient Medications:    albuterol (VENTOLIN HFA) 108 (90 Base) MCG/ACT inhaler, Inhale 2 puffs into the lungs every 6 (six) hours as needed for wheezing or shortness of breath., Disp: , Rfl:    amLODipine (NORVASC) 10 MG tablet, Take  10 mg by mouth daily., Disp: , Rfl:    aspirin EC 81 MG tablet, Take 81 mg by mouth every 3 (three) days. , Disp: , Rfl:    atorvastatin (LIPITOR) 20 MG tablet, Take 20 mg by mouth daily., Disp: , Rfl: 2   baclofen (LIORESAL) 20 MG tablet, Take 20 mg by mouth 3 (three) times daily., Disp: , Rfl: 2   carvedilol (COREG) 6.25 MG tablet, Take 6.25 mg by mouth 2 (two) times daily., Disp: , Rfl: 0   ciclopirox (PENLAC) 8 % solution, Apply topically at bedtime. Apply over nail and surrounding skin. Apply daily over previous coat. After seven (7) days, may remove with alcohol and continue cycle., Disp: 6.6 mL, Rfl: 0   clopidogrel (PLAVIX) 75 MG tablet, Take 1 tablet (75 mg total) by mouth daily. Please schedule annual appt with Dr. Gwenlyn Found for refills. [phone]. 1st attempt., Disp: 30 tablet, Rfl: 0   esomeprazole (NEXIUM) 40 MG capsule, Take 40 mg by mouth daily. , Disp: , Rfl: 12   HYDROcodone-acetaminophen (NORCO) 7.5-325 MG tablet, Take 1 tablet by mouth 3 (three) times daily as needed for moderate pain., Disp: 40 tablet, Rfl: 0   levothyroxine (SYNTHROID) 112  MCG tablet, Take 112 mcg by mouth daily before breakfast., Disp: , Rfl:    lisinopril (PRINIVIL,ZESTRIL) 20 MG tablet, Take 20 mg by mouth daily. , Disp: , Rfl:    metFORMIN (GLUCOPHAGE) 1000 MG tablet, Take 1,000 mg by mouth 2 (two) times daily., Disp: , Rfl: 3   Multiple Vitamin (MULTIVITAMIN WITH MINERALS) TABS tablet, Take 1 tablet by mouth daily., Disp: , Rfl:    nitroGLYCERIN (NITROSTAT) 0.4 MG SL tablet, Place 1 tablet (0.4 mg total) under the tongue every 5 (five) minutes as needed for chest pain., Disp: 25 tablet, Rfl: 12   trazodone (DESYREL) 300 MG tablet, Take 300 mg by mouth at bedtime. , Disp: , Rfl: 2   TRESIBA FLEXTOUCH 200 UNIT/ML SOPN, Inject 60 Units as directed daily. , Disp: , Rfl: 2  Social History   Tobacco Use  Smoking Status Every Day   Packs/day: 0.50   Years: 25.00   Total pack years: 12.50   Types: Cigarettes   Smokeless Tobacco Never  Tobacco Comments   Trying to stop smoking    Allergies  Allergen Reactions   Motrin [Ibuprofen] Hives   Bactrim [Sulfamethoxazole-Trimethoprim] Rash   Sulfa Antibiotics Hives and Rash   Objective:  There were no vitals filed for this visit. There is no height or weight on file to calculate BMI. Constitutional Well developed. Well nourished.  Vascular Dorsalis pedis pulses palpable bilaterally. Posterior tibial pulses palpable bilaterally. Capillary refill normal to all digits.  No cyanosis or clubbing noted. Pedal hair growth normal.  Neurologic Normal speech. Oriented to person, place, and time. Epicritic sensation to light touch grossly present bilaterally.  Dermatologic  No open wounds. No skin lesions.  Orthopedic: Left submetatarsal 5 porokeratosis pain on palpation to the lesion plantarflexed fifth metatarsal noted.  Right hallux thickened elongated dystrophic mycotic nail noticed x1 pain on palpation.   Radiographs: 3 views of skeletally mature adult left foot: Plantarflexed fifth metatarsal noted.Previous hardware noted appears to be in good position. Assessment:   1. Plantar flexed metatarsal, left   2. Nail dystrophy    Plan:  Patient was evaluated and treated and all questions answered.  Left fifth plantarflexed metatarsal and right hallux nail dystrophy -All questions and concerns were discussed with the patient in extensive detail -Given that she has failed all conservative care I believe she will benefit from floating osteotomy of the left fifth.  She is a diabetic but her A1c is 6.9.  And she has palpable pulses.  She would still be a high risk however patient would like to proceed despite the risks.  I discussed my preoperative intra postop plan in extensive detail.  She states understand like to proceed with surgery.  I will plan on doing left fifth metatarsal osteotomy and right hallux total nail avulsion with phenol  matricectomy  -Informed surgical risk consent was reviewed and read aloud to the patient.  I reviewed the films.  I have discussed my findings with the patient in great detail.  I have discussed all risks including but not limited to infection, stiffness, scarring, limp, disability, deformity, damage to blood vessels and nerves, numbness, poor healing, need for braces, arthritis, chronic pain, amputation, death.  All benefits and realistic expectations discussed in great detail.  I have made no promises as to the outcome.  I have provided realistic expectations.  I have offered the patient a 2nd opinion, which they have declined and assured me they preferred to proceed despite the risks   No  follow-ups on file.

## 2022-05-06 ENCOUNTER — Telehealth: Payer: Self-pay | Admitting: Urology

## 2022-05-06 NOTE — Telephone Encounter (Signed)
DOS - 05/18/22  METATARSAL OSTEOTOMY 5TH LEFT --- 09381 EXC NAIL PERM RIGHT --- 82993  HEALTHY BLUE MEDICAID EFFECTIVE DATE - 01/31/21  SPOKE WITH CARMEN WITH HEALTHY BLUE MEDICAID [place] AND SHE STATED THAT FOR CPT CODES 71696 AND 78938 HAVE BEEN APPROVED, [place] # [id number], GOOD FROM 05/18/22 - 07/16/22.  TRACKING # - [id number] CALL REF # - I - [id number]

## 2022-05-18 ENCOUNTER — Other Ambulatory Visit: Payer: Self-pay | Admitting: Podiatry

## 2022-05-18 ENCOUNTER — Encounter: Payer: Self-pay | Admitting: Podiatry

## 2022-05-18 DIAGNOSIS — L600 Ingrowing nail: Secondary | ICD-10-CM

## 2022-05-18 DIAGNOSIS — M21542 Acquired clubfoot, left foot: Secondary | ICD-10-CM

## 2022-05-18 MED ORDER — OXYCODONE-ACETAMINOPHEN 5-325 MG PO TABS: 1.0000 | ORAL_TABLET | ORAL | 0 refills | Status: DC | PRN

## 2022-05-20 ENCOUNTER — Telehealth: Payer: Self-pay | Admitting: *Deleted

## 2022-05-20 NOTE — Telephone Encounter (Signed)
Patient is calling because the boot is causing pain, spoke with the patient and encouraged to loosen the ace wrap, keep icing , elevation, rest,no weight on the post surgery foot, verbalized understanding , said ok

## 2022-05-27 ENCOUNTER — Ambulatory Visit (INDEPENDENT_AMBULATORY_CARE_PROVIDER_SITE_OTHER): Payer: Medicaid Other

## 2022-05-27 ENCOUNTER — Ambulatory Visit (INDEPENDENT_AMBULATORY_CARE_PROVIDER_SITE_OTHER): Payer: Medicaid Other | Admitting: Podiatry

## 2022-05-27 DIAGNOSIS — M216X2 Other acquired deformities of left foot: Secondary | ICD-10-CM

## 2022-05-27 DIAGNOSIS — Z9889 Other specified postprocedural states: Secondary | ICD-10-CM

## 2022-05-27 NOTE — Progress Notes (Signed)
Subjective:  Patient ID: [name], female    DOB: 02-24-1961,  MRN: [id number]  Chief Complaint  Patient presents with   Routine Post Op    POV #1 DOS 05/18/2022 LT 5TH FLOATING OSTEOTOMY & RT HALLUX TOTAL NAIL AVULSION MATRIXECTOMY    DOS: 05/18/2022 Procedure: Left fifth metatarsal floating osteotomy and right hallux total nail avulsion permanent  61 y.o. female returns for post-op check.  Patient states she is doing okay minimal pain she has been ambulating with surgical shoe to the left side.  She denies any other acute complaints no nausea fever chills vomiting.  Bandages clean dry and intact  Review of Systems: Negative except as noted in the HPI. Denies N/V/F/Ch.  Past Medical History:  Diagnosis Date   Abdominal pain, right lower quadrant    Abdominal wall pain    Arterial bruit    Arthritis    Arthropathy    Barrett's esophagus    Callus    Carotid stenosis, bilateral    Chest pain in adult    Chronic back pain    Colon polyp    Coronary artery disease, occlusive    stent x 2   Depression    Diabetic neuropathy ([place]) 06/02/2018   type 2   Eosinophilic esophagitis    Eosinophilic gastritis    Fatigue    Gastritis    Gastroesophagitis    GERD (gastroesophageal reflux disease)    H. pylori infection    H/O: hysterectomy    Hypersomnia    IBS (irritable bowel syndrome)    Inflammatory bowel disease    colitis   Inlet patch of esophagus    Myocardial infarction ([facility])    Pneumonia    Precordial pain    PVD (peripheral vascular disease) ([place])    pt. denies   Restless leg syndrome    Right foot pain    Shortness of breath on exertion    Shoulder pain, right    Ulcer of esophagus without bleeding     Current Outpatient Medications:    albuterol (VENTOLIN HFA) 108 (90 Base) MCG/ACT inhaler, Inhale 2 puffs into the lungs every 6 (six) hours as needed for wheezing or shortness of breath., Disp: , Rfl:    amLODipine (NORVASC) 10 MG tablet, Take 10 mg by  mouth daily., Disp: , Rfl:    aspirin EC 81 MG tablet, Take 81 mg by mouth every 3 (three) days. , Disp: , Rfl:    atorvastatin (LIPITOR) 20 MG tablet, Take 20 mg by mouth daily., Disp: , Rfl: 2   baclofen (LIORESAL) 20 MG tablet, Take 20 mg by mouth 3 (three) times daily., Disp: , Rfl: 2   carvedilol (COREG) 6.25 MG tablet, Take 6.25 mg by mouth 2 (two) times daily., Disp: , Rfl: 0   ciclopirox (PENLAC) 8 % solution, Apply topically at bedtime. Apply over nail and surrounding skin. Apply daily over previous coat. After seven (7) days, may remove with alcohol and continue cycle., Disp: 6.6 mL, Rfl: 0   clopidogrel (PLAVIX) 75 MG tablet, Take 1 tablet (75 mg total) by mouth daily. Please schedule annual appt with Dr. Gwenlyn Found for refills. [phone]. 1st attempt., Disp: 30 tablet, Rfl: 0   esomeprazole (NEXIUM) 40 MG capsule, Take 40 mg by mouth daily. , Disp: , Rfl: 12   HYDROcodone-acetaminophen (NORCO) 7.5-325 MG tablet, Take 1 tablet by mouth 3 (three) times daily as needed for moderate pain., Disp: 40 tablet, Rfl: 0   levothyroxine (SYNTHROID) 112 MCG tablet,  Take 112 mcg by mouth daily before breakfast., Disp: , Rfl:    lisinopril (PRINIVIL,ZESTRIL) 20 MG tablet, Take 20 mg by mouth daily. , Disp: , Rfl:    metFORMIN (GLUCOPHAGE) 1000 MG tablet, Take 1,000 mg by mouth 2 (two) times daily., Disp: , Rfl: 3   Multiple Vitamin (MULTIVITAMIN WITH MINERALS) TABS tablet, Take 1 tablet by mouth daily., Disp: , Rfl:    nitroGLYCERIN (NITROSTAT) 0.4 MG SL tablet, Place 1 tablet (0.4 mg total) under the tongue every 5 (five) minutes as needed for chest pain., Disp: 25 tablet, Rfl: 12   oxyCODONE-acetaminophen (PERCOCET) 5-325 MG tablet, Take 1 tablet by mouth every 4 (four) hours as needed for severe pain., Disp: 30 tablet, Rfl: 0   trazodone (DESYREL) 300 MG tablet, Take 300 mg by mouth at bedtime. , Disp: , Rfl: 2   TRESIBA FLEXTOUCH 200 UNIT/ML SOPN, Inject 60 Units as directed daily. , Disp: , Rfl:  2  Social History   Tobacco Use  Smoking Status Every Day   Packs/day: 0.50   Years: 25.00   Total pack years: 12.50   Types: Cigarettes  Smokeless Tobacco Never  Tobacco Comments   Trying to stop smoking    Allergies  Allergen Reactions   Motrin [Ibuprofen] Hives   Bactrim [Sulfamethoxazole-Trimethoprim] Rash   Sulfa Antibiotics Hives and Rash   Objective:  There were no vitals filed for this visit. There is no height or weight on file to calculate BMI. Constitutional Well developed. Well nourished.  Vascular Foot warm and well perfused. Capillary refill normal to all digits.   Neurologic Normal speech. Oriented to person, place, and time. Epicritic sensation to light touch grossly present bilaterally.  Dermatologic Skin healing well without signs of infection. Skin edges well coapted without signs of infection.  Orthopedic: Tenderness to palpation noted about the surgical site.   Radiographs: 3 views of skeletally mature adult left foot: Reduction of deformity noted.  Osteotomy appears to be in good position alignment. Assessment:   1. Plantar flexed metatarsal, left   2. S/P foot surgery    Plan:  Patient was evaluated and treated and all questions answered.  S/p foot surgery left -Progressing as expected post-operatively. -XR: See above -WB Status: Weightbearing as tolerated in surgical shoe -Sutures: Intact.  No medical signs of Deis is noted no complication noted. -Medications: None -Foot redressed.  No follow-ups on file.

## 2022-06-02 ENCOUNTER — Other Ambulatory Visit: Payer: Self-pay | Admitting: Family Medicine

## 2022-06-02 DIAGNOSIS — Z1231 Encounter for screening mammogram for malignant neoplasm of breast: Secondary | ICD-10-CM

## 2022-06-10 ENCOUNTER — Ambulatory Visit (INDEPENDENT_AMBULATORY_CARE_PROVIDER_SITE_OTHER): Payer: Medicaid Other | Admitting: Podiatry

## 2022-06-10 DIAGNOSIS — M216X2 Other acquired deformities of left foot: Secondary | ICD-10-CM

## 2022-06-10 DIAGNOSIS — Z9889 Other specified postprocedural states: Secondary | ICD-10-CM

## 2022-06-10 NOTE — Progress Notes (Signed)
Subjective:  Patient ID: [name], female    DOB: 18-Oct-1960,  MRN: [id number]  Chief Complaint  Patient presents with   Routine Post Op    POV #2 DOS 05/18/2022 LT 5TH FLOATING OSTEOTOMY & RT HALLUX TOTAL NAIL AVULSION MATRIXECTOMY    DOS: 05/18/2022 Procedure: Left fifth metatarsal floating osteotomy and right hallux total nail avulsion permanent  61 y.o. female returns for post-op check.  Patient states she is doing okay minimal pain she has been ambulating with surgical shoe to the left side.  She denies any other acute complaints no nausea fever chills vomiting.  Bandages clean dry and intact  Review of Systems: Negative except as noted in the HPI. Denies N/V/F/Ch.  Past Medical History:  Diagnosis Date   Abdominal pain, right lower quadrant    Abdominal wall pain    Arterial bruit    Arthritis    Arthropathy    Barrett's esophagus    Callus    Carotid stenosis, bilateral    Chest pain in adult    Chronic back pain    Colon polyp    Coronary artery disease, occlusive    stent x 2   Depression    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 pylori infection    H/O: hysterectomy    Hypersomnia    IBS (irritable bowel syndrome)    Inflammatory bowel disease    colitis   Inlet patch of esophagus    Myocardial infarction ([facility])    Pneumonia    Precordial pain    PVD (peripheral vascular disease) ([facility])    pt. denies   Restless leg syndrome    Right foot pain    Shortness of breath on exertion    Shoulder pain, right    Ulcer of esophagus without bleeding     Current Outpatient Medications:    albuterol (VENTOLIN HFA) 108 (90 Base) MCG/ACT inhaler, Inhale 2 puffs into the lungs every 6 (six) hours as needed for wheezing or shortness of breath., Disp: , Rfl:    amLODipine (NORVASC) 10 MG tablet, Take 10 mg by  mouth daily., Disp: , Rfl:    aspirin EC 81 MG tablet, Take 81 mg by mouth every 3 (three) days. , Disp: , Rfl:    atorvastatin (LIPITOR) 20 MG tablet, Take 20 mg by mouth daily., Disp: , Rfl: 2   baclofen (LIORESAL) 20 MG tablet, Take 20 mg by mouth 3 (three) times daily., Disp: , Rfl: 2   carvedilol (COREG) 6.25 MG tablet, Take 6.25 mg by mouth 2 (two) times daily., Disp: , Rfl: 0   ciclopirox (PENLAC) 8 % solution, Apply topically at bedtime. Apply over nail and surrounding skin. Apply daily over previous coat. After seven (7) days, may remove with alcohol and continue cycle., Disp: 6.6 mL, Rfl: 0   clopidogrel (PLAVIX) 75 MG tablet, Take 1 tablet (75 mg total) by mouth daily. Please schedule annual appt with Dr. Allyson Sabal for refills. [phone]. 1st attempt., Disp: 30 tablet, Rfl: 0   esomeprazole (NEXIUM) 40 MG capsule, Take 40 mg by mouth daily. , Disp: , Rfl: 12   HYDROcodone-acetaminophen (NORCO) 7.5-325 MG tablet, Take 1 tablet by mouth 3 (three) times daily as needed for moderate pain., Disp: 40 tablet, Rfl: 0   levothyroxine (SYNTHROID) 112 MCG tablet,  Take 112 mcg by mouth daily before breakfast., Disp: , Rfl:    lisinopril (PRINIVIL,ZESTRIL) 20 MG tablet, Take 20 mg by mouth daily. , Disp: , Rfl:    metFORMIN (GLUCOPHAGE) 1000 MG tablet, Take 1,000 mg by mouth 2 (two) times daily., Disp: , Rfl: 3   Multiple Vitamin (MULTIVITAMIN WITH MINERALS) TABS tablet, Take 1 tablet by mouth daily., Disp: , Rfl:    nitroGLYCERIN (NITROSTAT) 0.4 MG SL tablet, Place 1 tablet (0.4 mg total) under the tongue every 5 (five) minutes as needed for chest pain., Disp: 25 tablet, Rfl: 12   oxyCODONE-acetaminophen (PERCOCET) 5-325 MG tablet, Take 1 tablet by mouth every 4 (four) hours as needed for severe pain., Disp: 30 tablet, Rfl: 0   trazodone (DESYREL) 300 MG tablet, Take 300 mg by mouth at bedtime. , Disp: , Rfl: 2   TRESIBA FLEXTOUCH 200 UNIT/ML SOPN, Inject 60 Units as directed daily. , Disp: , Rfl:  2  Social History   Tobacco Use  Smoking Status Every Day   Packs/day: 0.50   Years: 25.00   Total pack years: 12.50   Types: Cigarettes  Smokeless Tobacco Never  Tobacco Comments   Trying to stop smoking    Allergies  Allergen Reactions   Motrin [Ibuprofen] Hives   Bactrim [Sulfamethoxazole-Trimethoprim] Rash   Sulfa Antibiotics Hives and Rash   Objective:  There were no vitals filed for this visit. There is no height or weight on file to calculate BMI. Constitutional Well developed. Well nourished.  Vascular Foot warm and well perfused. Capillary refill normal to all digits.   Neurologic Normal speech. Oriented to person, place, and time. Epicritic sensation to light touch grossly present bilaterally.  Dermatologic Skin completely epithelialized.  No signs of Deis is noted no complication noted.  Orthopedic: No tenderness to palpation noted about the surgical site.   Radiographs: 3 views of skeletally mature adult left foot: Reduction of deformity noted.  Osteotomy appears to be in good position alignment. Assessment:   1. Plantar flexed metatarsal, left   2. S/P foot surgery     Plan:  Patient was evaluated and treated and all questions answered.  S/p foot surgery left -Medically healed and officially discharged from my care I discussed shoe gear modification offloading if any foot and ankle issues on future of asked her to come back and see me.  She states understanding  No follow-ups on file.

## 2022-07-10 ENCOUNTER — Other Ambulatory Visit: Payer: Self-pay | Admitting: General Surgery

## 2022-07-10 DIAGNOSIS — K449 Diaphragmatic hernia without obstruction or gangrene: Secondary | ICD-10-CM

## 2022-07-10 DIAGNOSIS — Q790 Congenital diaphragmatic hernia: Secondary | ICD-10-CM

## 2022-07-21 ENCOUNTER — Ambulatory Visit: Payer: Medicaid Other

## 2022-07-30 ENCOUNTER — Ambulatory Visit
Admission: RE | Admit: 2022-07-30 | Discharge: 2022-07-30 | Disposition: A | Discharge status: Home / Self Care | Payer: Medicaid Other | Source: Ambulatory Visit | Attending: General Surgery | Admitting: General Surgery

## 2022-07-30 DIAGNOSIS — K449 Diaphragmatic hernia without obstruction or gangrene: Secondary | ICD-10-CM

## 2022-07-30 DIAGNOSIS — Q790 Congenital diaphragmatic hernia: Secondary | ICD-10-CM

## 2022-08-05 ENCOUNTER — Other Ambulatory Visit (HOSPITAL_COMMUNITY): Payer: Self-pay | Admitting: General Surgery

## 2022-08-05 DIAGNOSIS — K449 Diaphragmatic hernia without obstruction or gangrene: Secondary | ICD-10-CM

## 2022-08-05 DIAGNOSIS — Q790 Congenital diaphragmatic hernia: Secondary | ICD-10-CM

## 2022-08-11 ENCOUNTER — Ambulatory Visit (HOSPITAL_COMMUNITY)
Admission: RE | Admit: 2022-08-11 | Discharge: 2022-08-11 | Disposition: A | Discharge status: Home / Self Care | Payer: Medicaid Other | Source: Ambulatory Visit | Attending: General Surgery | Admitting: General Surgery

## 2022-08-11 DIAGNOSIS — Q790 Congenital diaphragmatic hernia: Secondary | ICD-10-CM | POA: Diagnosis present

## 2022-08-11 DIAGNOSIS — K449 Diaphragmatic hernia without obstruction or gangrene: Secondary | ICD-10-CM | POA: Diagnosis present

## 2022-09-11 ENCOUNTER — Ambulatory Visit: Payer: Medicaid Other

## 2022-09-22 ENCOUNTER — Ambulatory Visit
Admission: RE | Admit: 2022-09-22 | Discharge: 2022-09-22 | Disposition: A | Discharge status: Home / Self Care | Payer: Medicaid Other | Source: Ambulatory Visit | Attending: Family Medicine | Admitting: Family Medicine

## 2022-09-22 DIAGNOSIS — Z1231 Encounter for screening mammogram for malignant neoplasm of breast: Secondary | ICD-10-CM

## 2023-01-08 ENCOUNTER — Other Ambulatory Visit: Payer: Self-pay | Admitting: General Surgery

## 2023-01-08 DIAGNOSIS — R1010 Upper abdominal pain, unspecified: Secondary | ICD-10-CM

## 2023-01-08 DIAGNOSIS — Q790 Congenital diaphragmatic hernia: Secondary | ICD-10-CM

## 2023-01-18 ENCOUNTER — Encounter: Payer: Self-pay | Admitting: General Surgery

## 2023-01-29 ENCOUNTER — Other Ambulatory Visit: Payer: Medicaid Other

## 2023-03-08 ENCOUNTER — Institutional Professional Consult (permissible substitution): Payer: Medicaid Other | Admitting: Pulmonary Disease

## 2023-04-22 ENCOUNTER — Encounter: Payer: Self-pay | Admitting: Pulmonary Disease

## 2023-04-22 ENCOUNTER — Ambulatory Visit: Payer: Medicaid Other | Admitting: Pulmonary Disease

## 2023-04-22 VITALS — BP 124/70 | HR 63 | Ht 62.0 in | Wt 162.0 lb

## 2023-04-22 DIAGNOSIS — R0609 Other forms of dyspnea: Secondary | ICD-10-CM | POA: Diagnosis not present

## 2023-04-22 DIAGNOSIS — I2510 Atherosclerotic heart disease of native coronary artery without angina pectoris: Secondary | ICD-10-CM | POA: Diagnosis not present

## 2023-04-22 DIAGNOSIS — F17210 Nicotine dependence, cigarettes, uncomplicated: Secondary | ICD-10-CM

## 2023-04-22 DIAGNOSIS — Z720 Tobacco use: Secondary | ICD-10-CM

## 2023-04-22 MED ORDER — STIOLTO RESPIMAT 2.5-2.5 MCG/ACT IN AERS: 2.0000 | INHALATION_SPRAY | Freq: Every day | RESPIRATORY_TRACT | 6 refills | Status: DC

## 2023-04-22 MED ORDER — ALBUTEROL SULFATE HFA 108 (90 BASE) MCG/ACT IN AERS: 2.0000 | INHALATION_SPRAY | Freq: Four times a day (QID) | RESPIRATORY_TRACT | 11 refills | Status: AC | PRN

## 2023-04-22 NOTE — Progress Notes (Signed)
@Patient  ID: [name], female    DOB: 08/15/1960, 62 y.o.   MRN: [id number]  Chief Complaint  Patient presents with   Consult    Pt complains of SOB for 1 yr.    Referring provider: Kinsinger, De Blanch, *  HPI:   62 y.o. woman with significant coronary artery disease status post PCI times 09/22/2016 whom we are seeing for evaluation of dyspnea on exertion.  Multiple cardiology notes reviewed.  She notes dyspnea for the last year or so.  Only with exertion.  No shortness of breath at rest.  Not at night.  No time of day when things are better or worse.  No position that makes things better or worse.  No seasonal or environmental factors she can identify to make things better or worse.  In the midst of the symptoms labs were checked, hemoglobin of 12.  She has had cross-sectional imaging x 2 over the last year.  CT PE protocol 03/2022 with fat hernias and small sliding hernia, otherwise clear lungs no discussion of emphysema on my review interpretation.  CT low-dose screening 07/2022 with clear lungs described bilaterally, no mention of nodule or lesion or abnormality such as emphysema on my review.  She has been seen by cardiology.  No TTE since 2018 that I can see.  There are perfusion studies 2018 that indicated normal EF.  Notably at time of her catheterization and PCI in 2018 estimated EF was 45% on LV gram.  Questionaires / Pulmonary Flowsheets:   ACT:      No data to display          MMRC:     No data to display          [facility]:      No data to display          Tests:   FENO:  No results found for: "NITRICOXIDE"  PFT:     No data to display          WALK:      No data to display          Imaging: Personally reviewed and as per EMR discussion this note No results found.  Lab Results: Personally reviewed, no significant anemia on most recent labs 03/2022 via care everywhere CBC    Component Value Date/Time   WBC 12.9 (H) 12/05/2019  1826   RBC 4.88 12/05/2019 1826   HGB 14.2 12/05/2019 1826   HGB 13.6 05/25/2017 1219   HCT 44.1 12/05/2019 1826   HCT 40.7 05/25/2017 1219   PLT 219 12/05/2019 1826   PLT 256 05/25/2017 1219   MCV 90.4 12/05/2019 1826   MCV 85 05/25/2017 1219   MCH 29.1 12/05/2019 1826   MCHC 32.2 12/05/2019 1826   RDW 15.3 12/05/2019 1826   RDW 16.3 (H) 05/25/2017 1219   LYMPHSABS 1.9 12/01/2019 1143   MONOABS 0.6 12/01/2019 1143   EOSABS 0.1 12/01/2019 1143   BASOSABS 0.0 12/01/2019 1143    BMET    Component Value Date/Time   NA 139 12/05/2019 1826   NA 139 05/25/2017 1219   K 4.3 12/05/2019 1826   CL 102 12/05/2019 1826   CO2 29 12/05/2019 1826   GLUCOSE 302 (H) 12/05/2019 1826   BUN 15 12/05/2019 1826   BUN 14 05/25/2017 1219   CREATININE 0.76 12/05/2019 1826   CALCIUM 9.4 12/05/2019 1826   GFRNONAA >60 12/05/2019 1826   GFRAA >60 12/05/2019 1826    BNP  Component Value Date/Time   BNP 138.3 (H) 12/05/2019 1826    ProBNP No results found for: "PROBNP"  Specialty Problems       Pulmonary Problems   Shortness of breath on exertion   Hypoxia    Allergies  Allergen Reactions   Motrin [Ibuprofen] Hives   Bactrim [Sulfamethoxazole-Trimethoprim] Rash   Sulfa Antibiotics Hives and Rash    Immunization History  Administered Date(s) Administered   PFIZER(Purple Top)SARS-COV-2 Vaccination 11/13/2019    Past Medical History:  Diagnosis Date   Abdominal pain, right lower quadrant    Abdominal wall pain    Arterial bruit    Arthritis    Arthropathy    Barrett's esophagus    Callus    Carotid stenosis, bilateral    Chest pain in adult    Chronic back pain    Colon polyp    Coronary artery disease, occlusive    stent x 2   Depression    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 pylori infection    H/O:  hysterectomy    Hypersomnia    IBS (irritable bowel syndrome)    Inflammatory bowel disease    colitis   Inlet patch of esophagus    Myocardial infarction ([facility])    Pneumonia    Precordial pain    PVD (peripheral vascular disease) ([facility])    pt. denies   Restless leg syndrome    Right foot pain    Shortness of breath on exertion    Shoulder pain, right    Ulcer of esophagus without bleeding     Tobacco History: Social History   Tobacco Use  Smoking Status Every Day   Current packs/day: 0.50   Average packs/day: 0.5 packs/day for 25.0 years (12.5 ttl pk-yrs)   Types: Cigarettes  Smokeless Tobacco Never  Tobacco Comments   Trying to stop smoking   Ready to quit: Not Answered Counseling given: Not Answered Tobacco comments: Trying to stop smoking   Continue to not smoke  Outpatient Encounter Medications as of 04/22/2023  Medication Sig   amLODipine (NORVASC) 10 MG tablet Take 10 mg by mouth daily.   aspirin EC 81 MG tablet Take 81 mg by mouth every 3 (three) days.    atorvastatin (LIPITOR) 20 MG tablet Take 20 mg by mouth daily.   baclofen (LIORESAL) 20 MG tablet Take 20 mg by mouth 3 (three) times daily.   carvedilol (COREG) 6.25 MG tablet Take 6.25 mg by mouth 2 (two) times daily.   ciclopirox (PENLAC) 8 % solution Apply topically at bedtime. Apply over nail and surrounding skin. Apply daily over previous coat. After seven (7) days, may remove with alcohol and continue cycle.   clopidogrel (PLAVIX) 75 MG tablet Take 1 tablet (75 mg total) by mouth daily. Please schedule annual appt with Dr. Allyson Sabal for refills. [phone]. 1st attempt.   esomeprazole (NEXIUM) 40 MG capsule Take 40 mg by mouth daily.    HYDROcodone-acetaminophen (NORCO) 7.5-325 MG tablet Take 1 tablet by mouth 3 (three) times daily as needed for moderate pain.   levothyroxine (SYNTHROID) 112 MCG tablet Take 112 mcg by mouth daily before breakfast.   lisinopril (PRINIVIL,ZESTRIL) 20 MG tablet Take 20 mg by  mouth daily.    metFORMIN (GLUCOPHAGE) 1000 MG tablet Take 1,000 mg by mouth 2 (  two) times daily.   Multiple Vitamin (MULTIVITAMIN WITH MINERALS) TABS tablet Take 1 tablet by mouth daily.   nitroGLYCERIN (NITROSTAT) 0.4 MG SL tablet Place 1 tablet (0.4 mg total) under the tongue every 5 (five) minutes as needed for chest pain.   oxyCODONE-acetaminophen (PERCOCET) 5-325 MG tablet Take 1 tablet by mouth every 4 (four) hours as needed for severe pain.   Tiotropium Bromide-Olodaterol (STIOLTO RESPIMAT) 2.5-2.5 MCG/ACT AERS Inhale 2 puffs into the lungs daily.   trazodone (DESYREL) 300 MG tablet Take 300 mg by mouth at bedtime.    TRESIBA FLEXTOUCH 200 UNIT/ML SOPN Inject 60 Units as directed daily.    [DISCONTINUED] albuterol (VENTOLIN HFA) 108 (90 Base) MCG/ACT inhaler Inhale 2 puffs into the lungs every 6 (six) hours as needed for wheezing or shortness of breath.   albuterol (VENTOLIN HFA) 108 (90 Base) MCG/ACT inhaler Inhale 2 puffs into the lungs every 6 (six) hours as needed for wheezing or shortness of breath.   No facility-administered encounter medications on file as of 04/22/2023.     Review of Systems  Review of Systems  No chest pain exertion.  No orthopnea or PND.  Comprehensive review of systems otherwise negative. Physical Exam  BP 124/70 (BP Location: Right Arm, Cuff Size: Normal)   Pulse 63   Ht 5\' 2"  (1.575 m)   Wt 162 lb (73.5 kg)   SpO2 94%   BMI 29.63 kg/m   Wt Readings from Last 5 Encounters:  04/22/23 162 lb (73.5 kg)  04/17/20 155 lb (70.3 kg)  03/12/20 155 lb (70.3 kg)  02/08/20 155 lb (70.3 kg)  12/27/19 155 lb (70.3 kg)    BMI Readings from Last 5 Encounters:  04/22/23 29.63 kg/m  04/17/20 28.35 kg/m  03/12/20 28.35 kg/m  02/08/20 28.35 kg/m  12/27/19 28.35 kg/m     Physical Exam General: Sitting in chair, no acute distress Eyes: EOMI, no icterus Neck: Supple, no JVP appreciated sitting upright Abdomen: Nondistended, bowel sounds  present MSK: No synovitis, no joint effusion Neuro: Normal gait, no weakness Psych: Normal mood, full affect Pulmonary: Clear, no work of breathing Cardiovascular: Warm, no edema   Assessment & Plan:   Dyspnea on exertion: Suspect multifactorial.  Possibly related to habitus, deconditioning, smoking-related lung disease, known CAD.  Sliding hiatal hernia, fat hernia into thorax could be a mild contributor to restrictive physiology but unlikely to cause significant symptoms in isolation.  Cross-sectional imaging of the lungs reports normal findings without discussion of emphysema.  Will obtain PFTs for further evaluation.  Given her history of smoking, trial of dual bronchodilators with Stiolto.  Albuterol rescue inhaler.  Both prescribed today.  Advised her to keep ongoing follow-up with cardiologist, most recent note indicates desire to repeat TTE at next visit, things to be very informative in terms of workup for her dyspnea in light of her known CAD and prior cardiomyopathy that improved on interval TTE.  Smoking assessment and cessation counseling I have advised the patient to quit/stop smoking as soon as possible due to high risk for multiple medical problems.  It will also be very difficult for [place] to manage patient's  respiratory symptoms and status if we continue to expose her lungs to a known irritant. Patient is or is not willing to quit smoking. I have advised the patient that we can assist and have options of nicotine replacement therapy, provided smoking cessation education today, provided smoking cessation counseling, and provided cessation resources. Follow-up next office visit office visit for  assessment of smoking cessation.  I spent 4 minutes in smoking cessation counseling.    Return in about 4 months (around 08/22/2023) for f/u Dr. Judeth Horn.   Karren Burly, MD 04/22/2023   This appointment required 65 minutes of patient care (this includes precharting, chart review,  review of results, face-to-face care, etc.).

## 2023-04-22 NOTE — Patient Instructions (Signed)
Nice to meet you  Try Stiolto 2 puffs once a day every day, see if this helps with her shortness of breath throughout the day  I also prescribed albuterol, 2 puffs every 4-6 hours as needed for shortness of breath  I ordered pulmonary function test for further evaluation of your shortness of breath to see if we can arrive with a diagnosis.  The good news is the pictures of your lungs, the CT scans have been clear, the lungs overall look healthy which is good.  Return to clinic in 4 months or sooner as needed with Dr. Judeth Horn

## 2023-06-21 ENCOUNTER — Other Ambulatory Visit: Payer: Self-pay | Admitting: General Surgery

## 2023-06-21 DIAGNOSIS — K469 Unspecified abdominal hernia without obstruction or gangrene: Secondary | ICD-10-CM

## 2023-07-15 ENCOUNTER — Other Ambulatory Visit: Payer: Medicaid Other

## 2023-07-21 ENCOUNTER — Encounter (HOSPITAL_COMMUNITY): Payer: Self-pay | Admitting: Emergency Medicine

## 2023-07-21 ENCOUNTER — Emergency Department (HOSPITAL_COMMUNITY): Payer: Medicaid Other

## 2023-07-21 ENCOUNTER — Emergency Department (HOSPITAL_COMMUNITY)
Admission: EM | Admit: 2023-07-21 | Discharge: 2023-07-21 | Disposition: A | Discharge status: Home / Self Care | Payer: Medicaid Other | Attending: Emergency Medicine | Admitting: Emergency Medicine

## 2023-07-21 DIAGNOSIS — Z7902 Long term (current) use of antithrombotics/antiplatelets: Secondary | ICD-10-CM | POA: Insufficient documentation

## 2023-07-21 DIAGNOSIS — D72829 Elevated white blood cell count, unspecified: Secondary | ICD-10-CM | POA: Diagnosis not present

## 2023-07-21 DIAGNOSIS — R0602 Shortness of breath: Secondary | ICD-10-CM | POA: Insufficient documentation

## 2023-07-21 DIAGNOSIS — Z7982 Long term (current) use of aspirin: Secondary | ICD-10-CM | POA: Insufficient documentation

## 2023-07-21 DIAGNOSIS — I2510 Atherosclerotic heart disease of native coronary artery without angina pectoris: Secondary | ICD-10-CM | POA: Insufficient documentation

## 2023-07-21 LAB — COMPREHENSIVE METABOLIC PANEL
ALT: 15 U/L (ref 0–44)
AST: 18 U/L (ref 15–41)
Albumin: 3.5 g/dL (ref 3.5–5.0)
Alkaline Phosphatase: 131 U/L — ABNORMAL HIGH (ref 38–126)
Anion gap: 12 (ref 5–15)
BUN: 12 mg/dL (ref 8–23)
CO2: 26 mmol/L (ref 22–32)
Calcium: 9.4 mg/dL (ref 8.9–10.3)
Chloride: 99 mmol/L (ref 98–111)
Creatinine, Ser: 1.18 mg/dL — ABNORMAL HIGH (ref 0.44–1.00)
GFR, Estimated: 52 mL/min — ABNORMAL LOW (ref >60)
Glucose, Bld: 160 mg/dL — ABNORMAL HIGH (ref 70–99)
Potassium: 4.3 mmol/L (ref 3.5–5.1)
Sodium: 137 mmol/L (ref 135–145)
Total Bilirubin: 0.3 mg/dL (ref <1.2)
Total Protein: 7.9 g/dL (ref 6.5–8.1)

## 2023-07-21 LAB — TROPONIN I (HIGH SENSITIVITY): Troponin I (High Sensitivity): 11 ng/L (ref <18)

## 2023-07-21 LAB — CBC
HCT: 45.2 % (ref 36.0–46.0)
Hemoglobin: 15.1 g/dL — ABNORMAL HIGH (ref 12.0–15.0)
MCH: 29.2 pg (ref 26.0–34.0)
MCHC: 33.4 g/dL (ref 30.0–36.0)
MCV: 87.3 fL (ref 80.0–100.0)
Platelets: 238 10*3/uL (ref 150–400)
RBC: 5.18 MIL/uL — ABNORMAL HIGH (ref 3.87–5.11)
RDW: 15.5 % (ref 11.5–15.5)
WBC: 13.9 10*3/uL — ABNORMAL HIGH (ref 4.0–10.5)
nRBC: 0 % (ref 0.0–0.2)

## 2023-07-21 LAB — MAGNESIUM: Magnesium: 1.9 mg/dL (ref 1.7–2.4)

## 2023-07-21 LAB — BRAIN NATRIURETIC PEPTIDE: B Natriuretic Peptide: 96 pg/mL (ref 0.0–100.0)

## 2023-07-21 LAB — D-DIMER, QUANTITATIVE: D-Dimer, Quant: <0.27 ug{FEU}/mL (ref 0.00–0.50)

## 2023-07-21 MED ORDER — PREDNISONE 10 MG PO TABS: 40.0000 mg | ORAL_TABLET | Freq: Every day | ORAL | 0 refills | Status: DC

## 2023-07-21 MED ORDER — PREDNISONE 20 MG PO TABS
40.0000 mg | ORAL_TABLET | Freq: Once | ORAL | Status: AC
  Administered 2023-07-21: 40 mg via ORAL
  Filled 2023-07-21: qty 2

## 2023-07-21 MED ORDER — AZITHROMYCIN 250 MG PO TABS: 250.0000 mg | ORAL_TABLET | Freq: Every day | ORAL | 0 refills | Status: DC

## 2023-07-21 MED ORDER — PREDNISONE 10 MG PO TABS: 40.0000 mg | ORAL_TABLET | Freq: Every day | ORAL | 0 refills | Status: AC

## 2023-07-21 MED ORDER — AZITHROMYCIN 250 MG PO TABS
500.0000 mg | ORAL_TABLET | Freq: Once | ORAL | Status: AC
  Administered 2023-07-21: 500 mg via ORAL
  Filled 2023-07-21: qty 2

## 2023-07-21 NOTE — ED Notes (Signed)
NT ambulated pt in hallway approximately 15 ft. Pt O2 94% while walking. When pt arrived at bed O2 dropped to 88% and increased back to 94%

## 2023-07-21 NOTE — ED Provider Notes (Signed)
[place] EMERGENCY DEPARTMENT AT [facility] Provider Note   CSN: [id number] Arrival date & time: 07/21/23  1024     History  Chief Complaint  Patient presents with   Shortness of Breath   HPI [name] is a 62 y.o. female with history of CAD presenting for shortness of breath.  States shortness of breath has been going on for about a year.  States she was somewhat more short of breath today.  States she was being seen by her PCP at [facility] and her O2 sats were found to be in the low 90s.  Also reports a nonproductive cough but no fever.  Denies chest pain.  Denies calf tenderness, recent immobilization and known malignancy or coagulopathy.  Denies any recent weight gain or lower extremity edema.   Shortness of Breath      Home Medications Prior to Admission medications   Medication Sig Start Date End Date Taking? Authorizing Provider  azithromycin (ZITHROMAX) 250 MG tablet Take 1 tablet (250 mg total) by mouth daily. Take first 2 tablets together, then 1 every day until finished. 07/21/23  Yes Gareth Eagle, PA-C  predniSONE (DELTASONE) 10 MG tablet Take 4 tablets (40 mg total) by mouth daily for 5 days. 07/21/23 07/26/23 Yes Gareth Eagle, PA-C  albuterol (VENTOLIN HFA) 108 (90 Base) MCG/ACT inhaler Inhale 2 puffs into the lungs every 6 (six) hours as needed for wheezing or shortness of breath. 04/22/23   Hunsucker, Lesia Sago, MD  amLODipine (NORVASC) 10 MG tablet Take 10 mg by mouth daily. 09/07/18   [provider]  aspirin EC 81 MG tablet Take 81 mg by mouth every 3 (three) days.     [provider]  atorvastatin (LIPITOR) 20 MG tablet Take 20 mg by mouth daily. 05/06/17   [provider]  baclofen (LIORESAL) 20 MG tablet Take 20 mg by mouth 3 (three) times daily. 05/12/17   [provider]  carvedilol (COREG) 6.25 MG tablet Take 6.25 mg by mouth 2 (two) times daily. 04/09/17   [provider]  ciclopirox  (PENLAC) 8 % solution Apply topically at bedtime. Apply over nail and surrounding skin. Apply daily over previous coat. After seven (7) days, may remove with alcohol and continue cycle. 02/06/22   Candelaria Stagers, DPM  clopidogrel (PLAVIX) 75 MG tablet Take 1 tablet (75 mg total) by mouth daily. Please schedule annual appt with Dr. Allyson Sabal for refills. [phone]. 1st attempt. 09/18/19   Runell Gess, MD  esomeprazole (NEXIUM) 40 MG capsule Take 40 mg by mouth daily.  06/06/18   [provider]  HYDROcodone-acetaminophen (NORCO) 7.5-325 MG tablet Take 1 tablet by mouth 3 (three) times daily as needed for moderate pain. 12/27/19   Valeria Batman, MD  levothyroxine (SYNTHROID) 112 MCG tablet Take 112 mcg by mouth daily before breakfast.    [provider]  lisinopril (PRINIVIL,ZESTRIL) 20 MG tablet Take 20 mg by mouth daily.     [provider]  metFORMIN (GLUCOPHAGE) 1000 MG tablet Take 1,000 mg by mouth 2 (two) times daily. 05/06/17   [provider]  Multiple Vitamin (MULTIVITAMIN WITH MINERALS) TABS tablet Take 1 tablet by mouth daily.    [provider]  nitroGLYCERIN (NITROSTAT) 0.4 MG SL tablet Place 1 tablet (0.4 mg total) under the tongue every 5 (five) minutes as needed for chest pain. 05/28/17   Bhagat, Sharrell Ku, PA  oxyCODONE-acetaminophen (PERCOCET) 5-325 MG tablet Take 1 tablet by mouth every  4 (four) hours as needed for severe pain. 05/18/22   Candelaria Stagers, DPM  Tiotropium Bromide-Olodaterol (STIOLTO RESPIMAT) 2.5-2.5 MCG/ACT AERS Inhale 2 puffs into the lungs daily. 04/22/23   Hunsucker, Lesia Sago, MD  trazodone (DESYREL) 300 MG tablet Take 300 mg by mouth at bedtime.  06/07/18   [provider]  TRESIBA FLEXTOUCH 200 UNIT/ML SOPN Inject 60 Units as directed daily.  05/06/17   [provider]      Allergies    Motrin [ibuprofen], Bactrim [sulfamethoxazole-trimethoprim], and Sulfa antibiotics    Review of Systems    Review of Systems  Respiratory:  Positive for shortness of breath.     Physical Exam Updated Vital Signs BP 113/66   Pulse (!) 55   Temp 97.6 F (36.4 C) (Oral)   Resp 18   SpO2 94%  Physical Exam Vitals and nursing note reviewed.  HENT:     Head: Normocephalic and atraumatic.     Mouth/Throat:     Mouth: Mucous membranes are moist.  Eyes:     General:        Right eye: No discharge.        Left eye: No discharge.     Conjunctiva/sclera: Conjunctivae normal.  Cardiovascular:     Rate and Rhythm: Normal rate and regular rhythm.     Pulses: Normal pulses.     Heart sounds: Normal heart sounds.  Pulmonary:     Effort: Pulmonary effort is normal.     Breath sounds: Normal breath sounds. No decreased breath sounds, wheezing, rhonchi or rales.  Abdominal:     General: Abdomen is flat.     Palpations: Abdomen is soft.  Skin:    General: Skin is warm and dry.  Neurological:     General: No focal deficit present.  Psychiatric:        Mood and Affect: Mood normal.     ED Results / Procedures / Treatments   Labs (all labs ordered are listed, but only abnormal results are displayed) Labs Reviewed  CBC - Abnormal; Notable for the following components:      Result Value   WBC 13.9 (*)    RBC 5.18 (*)    Hemoglobin 15.1 (*)    All other components within normal limits  COMPREHENSIVE METABOLIC PANEL - Abnormal; Notable for the following components:   Glucose, Bld 160 (*)    Creatinine, Ser 1.18 (*)    Alkaline Phosphatase 131 (*)    GFR, Estimated 52 (*)    All other components within normal limits  BRAIN NATRIURETIC PEPTIDE  MAGNESIUM  D-DIMER, QUANTITATIVE  TROPONIN I (HIGH SENSITIVITY)  TROPONIN I (HIGH SENSITIVITY)    EKG EKG Interpretation Date/Time:  Wednesday July 21 2023 10:24:52 EST Ventricular Rate:  61 PR Interval:  148 QRS Duration:  86 QT Interval:  406 QTC Calculation: 408 R Axis:   34  Text Interpretation: Normal sinus rhythm When  compared with ECG of 28-May-2017 04:41, PREVIOUS ECG IS PRESENT Confirmed by Virgina Norfolk [id number]) on 07/21/2023 1:26:45 PM  Radiology DG Chest 2 View Result Date: 07/21/2023 CLINICAL DATA:  Chest pain, shortness of breath. EXAM: CHEST - 2 VIEW COMPARISON:  May 01, 2019. FINDINGS: Stable cardiomediastinal silhouette. Minimal bibasilar subsegmental atelectasis is noted. Bony thorax is unremarkable. IMPRESSION: No active cardiopulmonary disease. Electronically Signed   By: Lupita Raider M.D.   On: 07/21/2023 12:42    Procedures Procedures    Medications Ordered in ED Medications  azithromycin (ZITHROMAX)  tablet 500 mg (has no administration in time range)  predniSONE (DELTASONE) tablet 40 mg (has no administration in time range)    ED Course/ Medical Decision Making/ A&P                                 Medical Decision Making Amount and/or Complexity of Data Reviewed Labs: ordered. Radiology: ordered.  Risk Prescription drug management.   Initial Impression and Ddx 62 year old well-appearing female presenting for shortness of breath.  Exam was unremarkable.  DDx includes CHF, COPD exacerbation, PE, ACS, pneumonia, pneumothorax, other. Patient PMH that increases complexity of ED encounter:  CAD  Interpretation of Diagnostics - I independent reviewed and interpreted the labs as followed: leukocytosis  - I independently visualized the following imaging with scope of interpretation limited to determining acute life threatening conditions related to emergency care: cxr, which revealed no active process  -I personally reviewed and interpreted EKG which revealed normal sinus rhythm and non ischemic  Patient Reassessment and Ultimate Disposition/Management On reassessment patient was resting comfortably in her stretcher with no obvious signs of respiratory distress.  Trialed her on room air.  Able to ambulate her around the department and patient's sats remained in the mid  90s and she was not tachypneic or endorsing any symptoms of shortness of breath.  Workup not suggestive of PE, ACS, or CHF.  Given persistent cough and leukocytosis during this encounter, will treat empirically for pneumonia and acute bronchitis.  Started her on azithromycin and prednisone.  Advised her to follow-up PCP.  Discussed.  Return precautions.  Vital stable.  Discharged home in condition.  Patient management required discussion with the following services or consulting groups:  None  Complexity of Problems Addressed Acute complicated illness or Injury  Additional Data Reviewed and Analyzed Further history obtained from: Past medical history and medications listed in the EMR and Prior ED visit notes  Patient Encounter Risk Assessment Prescriptions         Final Clinical Impression(s) / ED Diagnoses Final diagnoses:  SOB (shortness of breath)    Rx / DC Orders ED Discharge Orders          Ordered    predniSONE (DELTASONE) 10 MG tablet  Daily        07/21/23 1417    azithromycin (ZITHROMAX) 250 MG tablet  Daily        07/21/23 1417              Gareth Eagle, PA-C 07/21/23 1418    Virgina Norfolk, DO 07/21/23 1432

## 2023-07-21 NOTE — ED Provider Triage Note (Signed)
Emergency Medicine Provider Triage Evaluation Note  [name] , a 62 y.o. female  was evaluated in triage.  Pt complains of chest pain.  History of CAD with 2 stents, hypertension hyperlipidemia.  Chest pain began this morning along with shortness of breath.  Radiates to left upper extremity.  Found to be hypoxic at PCP office and sent here for further evaluation.  Low 90s on room air.  No O2 at baseline.  324 mg aspirin prior to arrival  Review of Systems  Positive: Chest pain shortness of breath Negative: Fever chills nausea vomiting abdominal pain  Physical Exam  BP 119/73 (BP Location: Right Arm)   Pulse (!) 57   Temp 97.7 F (36.5 C) (Oral)   Resp 16   SpO2 97%  Gen:   Awake, no distress Resp:  Normal effort  MSK:   Moves extremities without difficulty  Other:    Medical Decision Making  Medically screening exam initiated at 10:51 AM.  Appropriate orders placed.  [name] was informed that the remainder of the evaluation will be completed by another provider, this initial triage assessment does not replace that evaluation, and the importance of remaining in the ED until their evaluation is complete.     Debra Foots, DO 07/21/23 1052

## 2023-07-21 NOTE — ED Triage Notes (Signed)
[facility]o sob requiring 2 liters O2 at the office due to low sats in the low low 90's

## 2023-07-21 NOTE — ED Notes (Signed)
324 mg asa given by ems

## 2023-07-21 NOTE — Discharge Instructions (Signed)
Evaluation today was overall reassuring.  Given that you are having a persistent cough and your white blood cell count was slightly elevated here I am going to treat you empirically for acute bronchitis and pneumonia.  I am starting on azithromycin and prednisone.  Please follow-up your PCP.  If you have worsening shortness of breath, chest pain, calf tenderness or swelling or any other concerning symptom please return emergency department further evaluation.

## 2023-07-22 ENCOUNTER — Other Ambulatory Visit: Payer: Medicaid Other

## 2023-08-03 ENCOUNTER — Ambulatory Visit
Admission: RE | Admit: 2023-08-03 | Discharge: 2023-08-03 | Disposition: A | Discharge status: Home / Self Care | Payer: Medicaid Other | Source: Ambulatory Visit | Attending: General Surgery

## 2023-08-03 DIAGNOSIS — K469 Unspecified abdominal hernia without obstruction or gangrene: Secondary | ICD-10-CM

## 2023-08-03 MED ORDER — IOPAMIDOL (ISOVUE-300) INJECTION 61%
100.0000 mL | Freq: Once | INTRAVENOUS | Status: AC | PRN
  Administered 2023-08-03: 100 mL via INTRAVENOUS

## 2023-08-09 ENCOUNTER — Ambulatory Visit: Payer: Medicaid Other | Admitting: Pulmonary Disease

## 2023-08-13 ENCOUNTER — Telehealth: Payer: Medicaid Other | Admitting: Pulmonary Disease

## 2023-08-20 ENCOUNTER — Other Ambulatory Visit: Payer: Self-pay | Admitting: General Surgery

## 2023-10-05 ENCOUNTER — Telehealth: Payer: Self-pay

## 2023-10-06 ENCOUNTER — Ambulatory Visit (INDEPENDENT_AMBULATORY_CARE_PROVIDER_SITE_OTHER): Payer: Medicaid Other | Admitting: Pulmonary Disease

## 2023-10-06 ENCOUNTER — Encounter: Payer: Self-pay | Admitting: Pulmonary Disease

## 2023-10-06 VITALS — BP 128/75 | HR 64 | Ht 62.0 in | Wt 161.4 lb

## 2023-10-06 DIAGNOSIS — Z9889 Other specified postprocedural states: Secondary | ICD-10-CM | POA: Diagnosis not present

## 2023-10-06 DIAGNOSIS — R0609 Other forms of dyspnea: Secondary | ICD-10-CM

## 2023-10-06 DIAGNOSIS — R490 Dysphonia: Secondary | ICD-10-CM | POA: Diagnosis not present

## 2023-10-06 MED ORDER — STIOLTO RESPIMAT 2.5-2.5 MCG/ACT IN AERS: 2.0000 | INHALATION_SPRAY | Freq: Every day | RESPIRATORY_TRACT | 11 refills | Status: AC

## 2023-10-06 NOTE — Patient Instructions (Signed)
 Nice to see you again  Continue Stiolto 2 puffs once a day, this was refilled today  Continue albuterol 2 puffs every 4-6 hours as needed for shortness of breath or wheeze, you should have refills of this available at the pharmacy  Given the voice hoarseness I think it is a good idea to see the ENT doctor.  It could be an aftereffect of the thyroid surgery.  Or could be related to something else they could see with a camera.  Or could be related to the reflux even the acid has been improved sometimes reflux can continue and you do not know it.  Return to clinic in 4 months or sooner as needed after pulmonary function test

## 2023-10-06 NOTE — Progress Notes (Signed)
 @Patient  ID: [name], female    DOB: 06-10-1961, 63 y.o.   MRN: [id number]  Chief Complaint  Patient presents with   Follow-up    Pt states she's keeps getting horse all the time.    Referring provider: Salli Real, MD  HPI:   63 y.o. woman with significant coronary artery disease status post PCI times 09/22/2016 whom we are seeing for evaluation of dyspnea on exertion.  ED note 12/24 reviewed.  Return to routine follow-up.  PFTs never scheduled from last visit.  She was prescribed Stiolto.  She does think this helps.  Use albuterol as needed.  With some improvement as well.  Still dyspneic but better than baseline prior to inhalers.  She continues to have hoarse voice.  Present for some time.  We discussed possible etiologies including reflux, nonacidic given the lack of heartburn symptoms and her PPI therapy, sequela of prior thyroid surgery but this was many many years ago, some new anatomic variation, abnormality.  Discussed ENT referral.  She is hesitant on fiberoptic laryngoscopy but I encouraged her to pursue this.  HPI at initial visit: She notes dyspnea for the last year or so.  Only with exertion.  No shortness of breath at rest.  Not at night.  No time of day when things are better or worse.  No position that makes things better or worse.  No seasonal or environmental factors she can identify to make things better or worse.  In the midst of the symptoms labs were checked, hemoglobin of 12.  She has had cross-sectional imaging x 2 over the last year.  CT PE protocol 03/2022 with fat hernias and small sliding hernia, otherwise clear lungs no discussion of emphysema on my review interpretation.  CT low-dose screening 07/2022 with clear lungs described bilaterally, no mention of nodule or lesion or abnormality such as emphysema on my review.  She has been seen by cardiology.  No TTE since 2018 that I can see.  There are perfusion studies 2018 that indicated normal EF.  Notably at time of  her catheterization and PCI in 2018 estimated EF was 45% on LV gram.  Questionaires / Pulmonary Flowsheets:   ACT:      No data to display          MMRC:     No data to display          Epworth:      No data to display          Tests:   FENO:  No results found for: "NITRICOXIDE"  PFT:     No data to display          WALK:      No data to display          Imaging: Personally reviewed and as per EMR discussion this note No results found.  Lab Results: Personally reviewed, no significant anemia on most recent labs 03/2022 via care everywhere CBC    Component Value Date/Time   WBC 13.9 (H) 07/21/2023 1157   RBC 5.18 (H) 07/21/2023 1157   HGB 15.1 (H) 07/21/2023 1157   HGB 13.6 05/25/2017 1219   HCT 45.2 07/21/2023 1157   HCT 40.7 05/25/2017 1219   PLT 238 07/21/2023 1157   PLT 256 05/25/2017 1219   MCV 87.3 07/21/2023 1157   MCV 85 05/25/2017 1219   MCH 29.2 07/21/2023 1157   MCHC 33.4 07/21/2023 1157   RDW 15.5 07/21/2023 1157   RDW  16.3 (H) 05/25/2017 1219   LYMPHSABS 1.9 12/01/2019 1143   MONOABS 0.6 12/01/2019 1143   EOSABS 0.1 12/01/2019 1143   BASOSABS 0.0 12/01/2019 1143    BMET    Component Value Date/Time   NA 137 07/21/2023 1157   NA 139 05/25/2017 1219   K 4.3 07/21/2023 1157   CL 99 07/21/2023 1157   CO2 26 07/21/2023 1157   GLUCOSE 160 (H) 07/21/2023 1157   BUN 12 07/21/2023 1157   BUN 14 05/25/2017 1219   CREATININE 1.18 (H) 07/21/2023 1157   CALCIUM 9.4 07/21/2023 1157   GFRNONAA 52 (L) 07/21/2023 1157   GFRAA >60 12/05/2019 1826    BNP    Component Value Date/Time   BNP 96.0 07/21/2023 1157    ProBNP No results found for: "PROBNP"  Specialty Problems       Pulmonary Problems   Shortness of breath on exertion   Hypoxia    Allergies  Allergen Reactions   Motrin [Ibuprofen] Hives   Bactrim [Sulfamethoxazole-Trimethoprim] Rash   Sulfa Antibiotics Hives and Rash    Immunization History   Administered Date(s) Administered   PFIZER(Purple Top)SARS-COV-2 Vaccination 11/13/2019    Past Medical History:  Diagnosis Date   Abdominal pain, right lower quadrant    Abdominal wall pain    Arterial bruit    Arthritis    Arthropathy    Barrett's esophagus    Callus    Carotid stenosis, bilateral    Chest pain in adult    Chronic back pain    Colon polyp    Coronary artery disease, occlusive    stent x 2   Depression    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 pylori infection    H/O: hysterectomy    Hypersomnia    IBS (irritable bowel syndrome)    Inflammatory bowel disease    colitis   Inlet patch of esophagus    Myocardial infarction (HCC)    Pneumonia    Precordial pain    PVD (peripheral vascular disease) (HCC)    pt. denies   Restless leg syndrome    Right foot pain    Shortness of breath on exertion    Shoulder pain, right    Ulcer of esophagus without bleeding     Tobacco History: Social History   Tobacco Use  Smoking Status Every Day   Current packs/day: 0.50   Average packs/day: 0.5 packs/day for 25.0 years (12.5 ttl pk-yrs)   Types: Cigarettes  Smokeless Tobacco Never  Tobacco Comments   Trying to stop smoking   Ready to quit: Not Answered Counseling given: Not Answered Tobacco comments: Trying to stop smoking   Continue to not smoke  Outpatient Encounter Medications as of 10/06/2023  Medication Sig   albuterol (VENTOLIN HFA) 108 (90 Base) MCG/ACT inhaler Inhale 2 puffs into the lungs every 6 (six) hours as needed for wheezing or shortness of breath.   amLODipine (NORVASC) 10 MG tablet Take 10 mg by mouth daily.   aspirin EC 81 MG tablet Take 81 mg by mouth every 3 (three) days.    atorvastatin (LIPITOR) 20 MG tablet Take 20 mg by mouth daily.   azithromycin (ZITHROMAX) 250 MG tablet  Take 1 tablet (250 mg total) by mouth daily. Take first 2 tablets together, then 1  every day until finished.   baclofen (LIORESAL) 20 MG tablet Take 20 mg by mouth 3 (three) times daily.   carvedilol (COREG) 6.25 MG tablet Take 6.25 mg by mouth 2 (two) times daily.   ciclopirox (PENLAC) 8 % solution Apply topically at bedtime. Apply over nail and surrounding skin. Apply daily over previous coat. After seven (7) days, may remove with alcohol and continue cycle.   clopidogrel (PLAVIX) 75 MG tablet Take 1 tablet (75 mg total) by mouth daily. Please schedule annual appt with Dr. Allyson Sabal for refills. [phone]. 1st attempt.   esomeprazole (NEXIUM) 40 MG capsule Take 40 mg by mouth daily.    HYDROcodone-acetaminophen (NORCO) 7.5-325 MG tablet Take 1 tablet by mouth 3 (three) times daily as needed for moderate pain.   levothyroxine (SYNTHROID) 112 MCG tablet Take 112 mcg by mouth daily before breakfast.   lisinopril (PRINIVIL,ZESTRIL) 20 MG tablet Take 20 mg by mouth daily.    metFORMIN (GLUCOPHAGE) 1000 MG tablet Take 1,000 mg by mouth 2 (two) times daily.   Multiple Vitamin (MULTIVITAMIN WITH MINERALS) TABS tablet Take 1 tablet by mouth daily.   nitroGLYCERIN (NITROSTAT) 0.4 MG SL tablet Place 1 tablet (0.4 mg total) under the tongue every 5 (five) minutes as needed for chest pain.   oxyCODONE-acetaminophen (PERCOCET) 5-325 MG tablet Take 1 tablet by mouth every 4 (four) hours as needed for severe pain.   trazodone (DESYREL) 300 MG tablet Take 300 mg by mouth at bedtime.    TRESIBA FLEXTOUCH 200 UNIT/ML SOPN Inject 60 Units as directed daily.    [DISCONTINUED] Tiotropium Bromide-Olodaterol (STIOLTO RESPIMAT) 2.5-2.5 MCG/ACT AERS Inhale 2 puffs into the lungs daily.   Tiotropium Bromide-Olodaterol (STIOLTO RESPIMAT) 2.5-2.5 MCG/ACT AERS Inhale 2 puffs into the lungs daily.   No facility-administered encounter medications on file as of 10/06/2023.     Review of Systems  Review of Systems   N/a Physical Exam  BP 128/75 (BP Location: Right Arm, Patient Position: Sitting, Cuff Size: Normal)   Pulse 64   Ht 5\' 2"  (1.575 m)   Wt 161 lb 6.4 oz (73.2 kg)   SpO2 94%   BMI 29.52 kg/m   Wt Readings from Last 5 Encounters:  10/06/23 161 lb 6.4 oz (73.2 kg)  04/22/23 162 lb (73.5 kg)  04/17/20 155 lb (70.3 kg)  03/12/20 155 lb (70.3 kg)  02/08/20 155 lb (70.3 kg)    BMI Readings from Last 5 Encounters:  10/06/23 29.52 kg/m  04/22/23 29.63 kg/m  04/17/20 28.35 kg/m  03/12/20 28.35 kg/m  02/08/20 28.35 kg/m     Physical Exam General: Sitting in chair, no acute distress Eyes: EOMI, no icterus Neck: Supple, no JVP appreciated sitting upright Abdomen: Nondistended, bowel sounds present MSK: No synovitis, no joint effusion Neuro: Normal gait, no weakness Psych: Normal mood, full affect Pulmonary: Clear, no work of breathing Cardiovascular: Warm, no edema   Assessment & Plan:   Dyspnea on exertion: Suspect multifactorial.  Possibly related to habitus, deconditioning, smoking-related lung disease, known CAD.  Sliding hiatal hernia, fat hernia into thorax could be a mild contributor to restrictive physiology but unlikely to cause significant symptoms in isolation.  Cross-sectional imaging of the lungs reports normal findings without discussion of emphysema.  Will obtain PFTs for further evaluation.  Given her history of smoking, trial of dual bronchodilators with Stiolto.  Albuterol rescue inhaler.  Both of given improvement..  Last cardiology note read mentions repeat TTE, yet to be done.  I think this will be  informative.  Voice hoarseness: Unclear etiology, no mucus production.  She has reflux, this is the most likely cause, posterior pharynx ongoing inflammation.  Fortunately her acid is well treated with PPI but suspect silent reflux.  She had thyroid surgery many many years ago.  This is quite possibly a culprit.  Referral to ENT today for evaluation of voice  hoarseness, video laryngoscopy to rule out anatomic issues or to diagnose issues I cannot see.  She is leery of this given bad experience in the past but I encouraged her to pursue this.   Return in about 4 months (around 02/05/2024) for f/u Dr. Judeth Horn, after PFT.   Karren Burly, MD 10/06/2023

## 2023-10-07 ENCOUNTER — Ambulatory Visit: Payer: Self-pay | Admitting: General Surgery

## 2023-10-11 NOTE — Telephone Encounter (Signed)
 calling about PT f/u seen that no PFT was done beforwe appt. was trying to schedule or reschedule

## 2023-10-18 NOTE — Progress Notes (Signed)
 COVID Vaccine received:  []  No [x]  Yes Date of any COVID positive Test in last 90 days:  PCP - Salli Real, MD  [phone] (Work)  [phone] (Fax)  Cardiologist - Rella Larve, MD  at [facility], [phone] (Work) [phone] (Fax) cardiac clearance per 08-19-2023 [facility] note  Chest x-ray - 07-21-2023  2v  Epic EKG -07-26-2023  Epic   Stress Test - 04-17-2019  Epic ECHO - 9-4--2018  Epic Cardiac Cath - 05-05-2021  at [facility]  Dr. Vanita Panda   note in [facility]  PCR screen: []  Ordered & Completed []   No Order but Needs PROFEND     [x]   N/A for this surgery  Surgery Plan:  [x]  Ambulatory   []  Outpatient in bed  []  Admit Anesthesia:    [x]  General  []  Spinal  []   Choice []   MAC  Bowel Prep - [x]  No  []   Yes ______  Pacemaker / ICD device [x]  No []  Yes   Spinal Cord Stimulator:[x]  No []  Yes       History of Sleep Apnea? [x]  No []  Yes   CPAP used?- [x]  No []  Yes    Does the patient monitor blood sugar?   []  N/A   []  No []  Yes  Patient has: []  NO Hx DM   []  Pre-DM   []  DM1  [x]   DM2 Last A1c was:6.5 on 03-04-2022     Does patient have a [employer] or Dexcom? []  No []  Yes   Fasting Blood Sugar Ranges-  Checks Blood Sugar _____ times a day Metformin- 500mg  tid -  Hold DOS Tresiba- Day before: breakfast- 100% dose,      DOS: 50% dose  Blood Thinner / Instructions:  none Aspirin Instructions:  ASA  Hold x 3 days per Dr. Sheliah Hatch  ERAS Protocol Ordered: []  No  [x]  Yes PRE-SURGERY []  ENSURE  []  G2   [x]  No Drink Ordered Patient is to be NPO after: 0430  Dental hx: []  Dentures:  []  N/A      []  Bridge or Partial:                   []  Loose or Damaged teeth:   Comments:   Activity level: Patient is able / unable to climb a flight of stairs without difficulty; []  No CP  []  No SOB, but would have ___   Patient can / can not perform ADLs without assistance.   Anesthesia review: CAD- hx MI (LHC x2- DES x2), smoker, RLS, GERD, asthma- DOE  Patient denies shortness of breath,  fever, cough and chest pain at PAT appointment.  Patient verbalized understanding and agreement to the Pre-Surgical Instructions that were given to them at this PAT appointment. Patient was also educated of the need to review these PAT instructions again prior to his/her surgery.I reviewed the appropriate phone numbers to call if they have any and questions or concerns.

## 2023-10-19 NOTE — Patient Instructions (Signed)
 SURGICAL WAITING ROOM VISITATION Patients having surgery or a procedure may have no more than 2 support people in the waiting area - these visitors may rotate in the visitor waiting room.   Due to an increase in RSV and influenza rates and associated hospitalizations, children ages 80 and under may not visit patients in [facility] hospitals. If the patient needs to stay at the hospital during part of their recovery, the visitor guidelines for inpatient rooms apply.  PRE-OP VISITATION  Pre-op nurse will coordinate an appropriate time for 1 support person to accompany the patient in pre-op.  This support person may not rotate.  This visitor will be contacted when the time is appropriate for the visitor to come back in the pre-op area.  Please refer to the [facility] website for the visitor guidelines for Inpatients (after your surgery is over and you are in a regular room).  You are not required to quarantine at this time prior to your surgery. However, you must do this: Hand Hygiene often Do NOT share personal items Notify your provider if you are in close contact with someone who has COVID or you develop fever 100.4 or greater, new onset of sneezing, cough, sore throat, shortness of breath or body aches.  If you test positive for Covid or have been in contact with anyone that has tested positive in the last 10 days please notify you surgeon.    Your procedure is scheduled on:  Friday  November 05, 2023  Report to [facility] Main Entrance: Leota Jacobsen entrance where the [employer] is available.   Report to admitting at: 05:15    AM  Call this number if you have any questions or problems the morning of surgery [phone]  FOLLOW ANY ADDITIONAL PRE OP INSTRUCTIONS YOU RECEIVED FROM YOUR SURGEON'S OFFICE!!!  How to Manage Your Diabetes Before and After Surgery  Why is it important to control my blood sugar before and after surgery? Improving blood sugar levels before and after  surgery helps healing and can limit problems. A way of improving blood sugar control is eating a healthy diet by:  Eating less sugar and carbohydrates  Increasing activity/exercise  Talking with your doctor about reaching your blood sugar goals High blood sugars (greater than 180 mg/dL) can raise your risk of infections and slow your recovery, so you will need to focus on controlling your diabetes during the weeks before surgery. Make sure that the doctor who takes care of your diabetes knows about your planned surgery including the date and location.  How do I manage my blood sugar before surgery? Check your blood sugar at least 4 times a day, starting 2 days before surgery, to make sure that the level is not too high or low. Check your blood sugar the morning of your surgery when you wake up and every 2 hours until you get to the Short Stay unit. If your blood sugar is less than 70 mg/dL, you will need to treat for low blood sugar: Do not take insulin. Treat a low blood sugar (less than 70 mg/dL) with  cup of clear juice (cranberry or apple), 4 glucose tablets, OR glucose gel. Recheck blood sugar in 15 minutes after treatment (to make sure it is greater than 70 mg/dL). If your blood sugar is not greater than 70 mg/dL on recheck, call [phone] for further instructions. Report your blood sugar to the short stay nurse when you get to Short Stay.  If you are admitted  to the hospital after surgery: Your blood sugar will be checked by the staff and you will probably be given insulin after surgery (instead of oral diabetes medicines) to make sure you have good blood sugar levels. The goal for blood sugar control after surgery is 80-180 mg/dL.   WHAT DO I DO ABOUT MY DIABETES MEDICATION?  Metformin- 500mg  tid -  Day before surgery, take as usual.  DAY OF SURGERY, DO NOT TAKE METFORMIN  Tresiba- Day before surgery : breakfast- 100% dose or 60 units.       DAY OF SURGERY: TAKE 50% or 30  units.    IF you have any questions, call the nurse at [phone]  Do not eat food after Midnight the night prior to your surgery/procedure.  After Midnight you may have the following liquids until 04:30 AM DAY OF SURGERY  Clear Liquid Diet Water Black Coffee (sugar ok, NO MILK/CREAM OR CREAMERS)  Tea (sugar ok, NO MILK/CREAM OR CREAMERS) regular and decaf                             Plain Jell-O  with no fruit (NO RED)                                           Fruit ices (not with fruit pulp, NO RED)                                     Popsicles (NO RED)                                                                  Juice: NO CITRUS JUICES: only apple, WHITE grape, WHITE cranberry Sports drinks like Gatorade or Powerade (NO RED)                Oral Hygiene is also important to reduce your risk of infection.        Remember - BRUSH YOUR TEETH THE MORNING OF SURGERY WITH YOUR REGULAR TOOTHPASTE  Do NOT smoke after Midnight the night before surgery.  STOP TAKING all Vitamins, Herbs and supplements 1 week before your surgery.   Take ONLY these medicines the morning of surgery with A SIP OF WATER: omeprazole, levothyroxine, Amlodipine, atenolol, and hydrocodone APAP if needed for pain. You may use your Albuterol / Stiolto inhalers. Please bring you Albuterol inhaler with you on the surgery day.                    You may not have any metal on your body including hair pins, jewelry, and body piercing  Do not wear make-up, lotions, powders, perfumes or deodorant  Do not wear nail polish including gel and S&S, artificial / acrylic nails, or any other type of covering on natural nails including finger and toenails. If you have artificial nails, gel coating, etc., that needs to be removed by a nail salon, Please have this removed prior to surgery. Not doing so may mean that your surgery could be cancelled or  delayed if the Surgeon or anesthesia staff feels like they are unable to monitor  you safely.   Do not shave 48 hours prior to surgery to avoid nicks in your skin which may contribute to postoperative infections.   Contacts, Hearing Aids, dentures or bridgework may not be worn into surgery. DENTURES WILL BE REMOVED PRIOR TO SURGERY PLEASE DO NOT APPLY "Poly grip" OR ADHESIVES!!!  Patients discharged on the day of surgery will not be allowed to drive home.  Someone NEEDS to stay with you for the first 24 hours after anesthesia.  Do not bring your home medications to the hospital. The Pharmacy will dispense medications listed on your medication list to you during your admission in the Hospital.  Please read over the following fact sheets you were given: IF YOU HAVE QUESTIONS ABOUT YOUR PRE-OP INSTRUCTIONS, PLEASE CALL [phone]   [facility] - Preparing for Surgery Before surgery, you can play an important role.  Because skin is not sterile, your skin needs to be as free of germs as possible.  You can reduce the number of germs on your skin by washing with CHG (chlorahexidine gluconate) soap before surgery.  CHG is an antiseptic cleaner which kills germs and bonds with the skin to continue killing germs even after washing. Please DO NOT use if you have an allergy to CHG or antibacterial soaps.  If your skin becomes reddened/irritated stop using the CHG and inform your nurse when you arrive at Short Stay. Do not shave (including legs and underarms) for at least 48 hours prior to the first CHG shower.  You may shave your face/neck.  Please follow these instructions carefully:  1.  Shower with CHG Soap the night before surgery and the  morning of surgery.  2.  If you choose to wash your hair, wash your hair first as usual with your normal  shampoo.  3.  After you shampoo, rinse your hair and body thoroughly to remove the shampoo.                             4.  Use CHG as you would any other liquid soap.  You can apply chg directly to the skin and wash.  Gently with a scrungie  or clean washcloth.  5.  Apply the CHG Soap to your body ONLY FROM THE NECK DOWN.   Do not use on face/ open                           Wound or open sores. Avoid contact with eyes, ears mouth and genitals (private parts).                       Wash face,  Genitals (private parts) with your normal soap.             6.  Wash thoroughly, paying special attention to the area where your  surgery  will be performed.  7.  Thoroughly rinse your body with warm water from the neck down.  8.  DO NOT shower/wash with your normal soap after using and rinsing off the CHG Soap.            9.  Pat yourself dry with a clean towel.            10.  Wear clean pajamas.  11.  Place clean sheets on your bed the night of your first shower and do not  sleep with pets.  ON THE DAY OF SURGERY : Do not apply any lotions/deodorants the morning of surgery.  Please wear clean clothes to the hospital/surgery center.     FAILURE TO FOLLOW THESE INSTRUCTIONS MAY RESULT IN THE CANCELLATION OF YOUR SURGERY  PATIENT SIGNATURE_________________________________  NURSE SIGNATURE__________________________________  ________________________________________________________________________

## 2023-10-20 ENCOUNTER — Encounter (HOSPITAL_COMMUNITY): Payer: Self-pay

## 2023-10-20 ENCOUNTER — Other Ambulatory Visit: Payer: Self-pay

## 2023-10-20 ENCOUNTER — Encounter (HOSPITAL_COMMUNITY)
Admission: RE | Admit: 2023-10-20 | Discharge: 2023-10-20 | Disposition: A | Discharge status: Home / Self Care | Payer: Self-pay | Source: Ambulatory Visit | Attending: General Surgery | Admitting: General Surgery

## 2023-10-20 VITALS — BP 114/63 | HR 70 | Temp 98.1°F | Resp 20 | Ht 62.0 in | Wt 162.0 lb

## 2023-10-20 DIAGNOSIS — Z8614 Personal history of Methicillin resistant Staphylococcus aureus infection: Secondary | ICD-10-CM | POA: Diagnosis not present

## 2023-10-20 DIAGNOSIS — I2510 Atherosclerotic heart disease of native coronary artery without angina pectoris: Secondary | ICD-10-CM | POA: Diagnosis not present

## 2023-10-20 DIAGNOSIS — Z794 Long term (current) use of insulin: Secondary | ICD-10-CM | POA: Insufficient documentation

## 2023-10-20 DIAGNOSIS — E119 Type 2 diabetes mellitus without complications: Secondary | ICD-10-CM | POA: Diagnosis not present

## 2023-10-20 DIAGNOSIS — Z01818 Encounter for other preprocedural examination: Secondary | ICD-10-CM

## 2023-10-20 DIAGNOSIS — Z01812 Encounter for preprocedural laboratory examination: Secondary | ICD-10-CM | POA: Diagnosis present

## 2023-10-20 HISTORY — DX: Myoneural disorder, unspecified: G70.9

## 2023-10-20 HISTORY — DX: Essential (primary) hypertension: I10

## 2023-10-20 HISTORY — DX: Unspecified asthma, uncomplicated: J45.909

## 2023-10-20 HISTORY — DX: Hypothyroidism, unspecified: E03.9

## 2023-10-20 LAB — CBC
HCT: 45.2 % (ref 36.0–46.0)
Hemoglobin: 14.7 g/dL (ref 12.0–15.0)
MCH: 28.8 pg (ref 26.0–34.0)
MCHC: 32.5 g/dL (ref 30.0–36.0)
MCV: 88.6 fL (ref 80.0–100.0)
Platelets: 227 10*3/uL (ref 150–400)
RBC: 5.1 MIL/uL (ref 3.87–5.11)
RDW: 15.3 % (ref 11.5–15.5)
WBC: 10.7 10*3/uL — ABNORMAL HIGH (ref 4.0–10.5)
nRBC: 0 % (ref 0.0–0.2)

## 2023-10-20 LAB — SURGICAL PCR SCREEN
MRSA, PCR: POSITIVE — AB
Staphylococcus aureus: POSITIVE — AB

## 2023-10-20 LAB — BASIC METABOLIC PANEL
Anion gap: 10 (ref 5–15)
BUN: 13 mg/dL (ref 8–23)
CO2: 28 mmol/L (ref 22–32)
Calcium: 9.4 mg/dL (ref 8.9–10.3)
Chloride: 102 mmol/L (ref 98–111)
Creatinine, Ser: 0.74 mg/dL (ref 0.44–1.00)
GFR, Estimated: >60 mL/min (ref >60)
Glucose, Bld: 96 mg/dL (ref 70–99)
Potassium: 3.4 mmol/L — ABNORMAL LOW (ref 3.5–5.1)
Sodium: 140 mmol/L (ref 135–145)

## 2023-10-20 LAB — HEMOGLOBIN A1C
Hgb A1c MFr Bld: 8.2 % — ABNORMAL HIGH (ref 4.8–5.6)
Mean Plasma Glucose: 188.64 mg/dL

## 2023-10-20 LAB — GLUCOSE, CAPILLARY: Glucose-Capillary: 106 mg/dL — ABNORMAL HIGH (ref 70–99)

## 2023-10-20 NOTE — Progress Notes (Signed)
 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 Appropriate notes have been placed on the patient's chart. This note has been routed to Dr. Sheliah Hatch and Sheralyn Boatman for review. The Patient's surgery is currently scheduled for:  11-05-2023 at [facility].  Also, patient's Hgb A1c is 8.2  Rudean Haskell, BSN, CVRN-BC   Pre-Surgical Testing Nurse [facility]  [phone]

## 2023-11-03 ENCOUNTER — Other Ambulatory Visit: Payer: Self-pay | Admitting: Internal Medicine

## 2023-11-03 DIAGNOSIS — Z1231 Encounter for screening mammogram for malignant neoplasm of breast: Secondary | ICD-10-CM

## 2023-11-04 NOTE — Anesthesia Preprocedure Evaluation (Signed)
 Anesthesia Evaluation  Patient identified by MRN, date of birth, ID band Patient awake    Reviewed: Allergy & Precautions, NPO status , Patient's Chart, lab work & pertinent test results  History of Anesthesia Complications Negative for: history of anesthetic complications  Airway Mallampati: II  TM Distance: >3 FB Neck ROM: Full    Dental  (+) Edentulous Upper, Edentulous Lower   Pulmonary asthma , sleep apnea (noncompliant) , pneumonia, Current Smoker and Patient abstained from smoking.   Pulmonary exam normal breath sounds clear to auscultation       Cardiovascular hypertension, Pt. on medications and Pt. on home beta blockers (-) angina + CAD, + Past MI, + Cardiac Stents, + Peripheral Vascular Disease and +CHF   Rhythm:Regular Rate:Normal  Cardiologist note 07/2023 # CAD S/P PCI of RCA and OM1 -- Patient is doing well without any complaints of angina or exertional shortness of breath.  -- No prior TTE in our system.  -- Most recent cath in 05/2021 revealed moderate non-obstructive CAD with patent RCA and OM1 stents.   # CAD S/P PCI of RCA and OM1 -- Patient is doing well without any complaints of angina or exertional shortness of breath.  -- No prior TTE in our system.  -- Most recent cath in 05/2021 revealed moderate non-obstructive CAD with patent RCA and OM1 stents.  -- Patient is CCS-I for angina, currently warm and well perfused, euvolemic.  Plan:  -- We should stay the course with current medications and reserve any further invasive strategy for worsening symptoms despite maximal medical therapy.  -- Continue ASA 81mg  daily indefinitely. Refilled today.  -- Refilled her NTG SL PRN -- Will get a TTE to assess LV function given her dyspnea  -- Strongly recommended smoking cessation  -- BP goal < 130/80 -- LDL goal < 70 -- HGA1c goal < 7     '18 Cath - Prox RCA to Mid RCA lesion, 0 %stenosed. Prox RCA lesion, 60  %stenosed. Mid LAD lesion, 50 %stenosed. Ost 1st Mrg to 1st Mrg lesion, 90 %stenosed. Post intervention, there is a 0% residual stenosis. A stent was successfully placed. There is mild to moderate left ventricular systolic dysfunction. LV end diastolic pressure is normal. The left ventricular ejection fraction is 45-50% by visual estimate.   Neuro/Psych  PSYCHIATRIC DISORDERS  Depression     Neuromuscular disease (RLS)    GI/Hepatic Neg liver ROS, PUD,GERD  Medicated and Controlled,, IBS    Endo/Other  diabetes, Type 2, Oral Hypoglycemic Agents, Insulin DependentHypothyroidism    Renal/GU negative Renal ROS     Musculoskeletal  (+) Arthritis ,   Chronic back pain    Abdominal   Peds  Hematology  On plavix    Anesthesia Other Findings Covid neg 4/30   Reproductive/Obstetrics                              Anesthesia Physical Anesthesia Plan  ASA: 4  Anesthesia Plan: General   Post-op Pain Management: Tylenol PO (pre-op)*   Induction: Intravenous  PONV Risk Score and Plan: 4 or greater and Treatment may vary due to age or medical condition, Ondansetron, Dexamethasone and Midazolam  Airway Management Planned: Oral ETT  Additional Equipment: None  Intra-op Plan:   Post-operative Plan: Extubation in OR  Informed Consent: I have reviewed the patients History and Physical, chart, labs and discussed the procedure including the risks, benefits and alternatives for the proposed anesthesia  with the patient or authorized representative who has indicated his/her understanding and acceptance.     Dental advisory given  Plan Discussed with: CRNA  Anesthesia Plan Comments:          Anesthesia Quick Evaluation

## 2023-11-05 ENCOUNTER — Ambulatory Visit (HOSPITAL_BASED_OUTPATIENT_CLINIC_OR_DEPARTMENT_OTHER): Payer: Self-pay | Admitting: Certified Registered Nurse Anesthetist

## 2023-11-05 ENCOUNTER — Ambulatory Visit (HOSPITAL_COMMUNITY)
Admission: RE | Admit: 2023-11-05 | Discharge: 2023-11-05 | Disposition: A | Discharge status: Home / Self Care | Payer: Medicaid Other | Source: Ambulatory Visit | Attending: General Surgery | Admitting: General Surgery

## 2023-11-05 ENCOUNTER — Encounter (HOSPITAL_COMMUNITY)
Admission: RE | Disposition: A | Discharge status: Home / Self Care | Payer: Self-pay | Source: Ambulatory Visit | Attending: General Surgery

## 2023-11-05 ENCOUNTER — Other Ambulatory Visit: Payer: Self-pay

## 2023-11-05 ENCOUNTER — Ambulatory Visit (HOSPITAL_COMMUNITY): Payer: Self-pay | Admitting: Certified Registered Nurse Anesthetist

## 2023-11-05 ENCOUNTER — Encounter (HOSPITAL_COMMUNITY): Payer: Self-pay | Admitting: General Surgery

## 2023-11-05 ENCOUNTER — Ambulatory Visit (HOSPITAL_COMMUNITY)

## 2023-11-05 DIAGNOSIS — E1151 Type 2 diabetes mellitus with diabetic peripheral angiopathy without gangrene: Secondary | ICD-10-CM | POA: Diagnosis not present

## 2023-11-05 DIAGNOSIS — E119 Type 2 diabetes mellitus without complications: Secondary | ICD-10-CM

## 2023-11-05 DIAGNOSIS — K449 Diaphragmatic hernia without obstruction or gangrene: Secondary | ICD-10-CM | POA: Insufficient documentation

## 2023-11-05 DIAGNOSIS — K429 Umbilical hernia without obstruction or gangrene: Secondary | ICD-10-CM | POA: Diagnosis present

## 2023-11-05 DIAGNOSIS — Z7984 Long term (current) use of oral hypoglycemic drugs: Secondary | ICD-10-CM | POA: Diagnosis not present

## 2023-11-05 DIAGNOSIS — I2510 Atherosclerotic heart disease of native coronary artery without angina pectoris: Secondary | ICD-10-CM

## 2023-11-05 DIAGNOSIS — G4730 Sleep apnea, unspecified: Secondary | ICD-10-CM | POA: Insufficient documentation

## 2023-11-05 DIAGNOSIS — Z7982 Long term (current) use of aspirin: Secondary | ICD-10-CM | POA: Insufficient documentation

## 2023-11-05 DIAGNOSIS — G8929 Other chronic pain: Secondary | ICD-10-CM | POA: Diagnosis not present

## 2023-11-05 DIAGNOSIS — I509 Heart failure, unspecified: Secondary | ICD-10-CM | POA: Diagnosis not present

## 2023-11-05 DIAGNOSIS — F32A Depression, unspecified: Secondary | ICD-10-CM | POA: Diagnosis not present

## 2023-11-05 DIAGNOSIS — I110 Hypertensive heart disease with heart failure: Secondary | ICD-10-CM

## 2023-11-05 DIAGNOSIS — K219 Gastro-esophageal reflux disease without esophagitis: Secondary | ICD-10-CM | POA: Diagnosis not present

## 2023-11-05 DIAGNOSIS — Z794 Long term (current) use of insulin: Secondary | ICD-10-CM | POA: Insufficient documentation

## 2023-11-05 DIAGNOSIS — Z79899 Other long term (current) drug therapy: Secondary | ICD-10-CM | POA: Diagnosis not present

## 2023-11-05 DIAGNOSIS — K589 Irritable bowel syndrome without diarrhea: Secondary | ICD-10-CM | POA: Diagnosis not present

## 2023-11-05 DIAGNOSIS — G2581 Restless legs syndrome: Secondary | ICD-10-CM | POA: Diagnosis not present

## 2023-11-05 DIAGNOSIS — M1990 Unspecified osteoarthritis, unspecified site: Secondary | ICD-10-CM | POA: Diagnosis not present

## 2023-11-05 DIAGNOSIS — Z7902 Long term (current) use of antithrombotics/antiplatelets: Secondary | ICD-10-CM | POA: Insufficient documentation

## 2023-11-05 DIAGNOSIS — Z01818 Encounter for other preprocedural examination: Secondary | ICD-10-CM

## 2023-11-05 DIAGNOSIS — K279 Peptic ulcer, site unspecified, unspecified as acute or chronic, without hemorrhage or perforation: Secondary | ICD-10-CM | POA: Diagnosis not present

## 2023-11-05 DIAGNOSIS — F17210 Nicotine dependence, cigarettes, uncomplicated: Secondary | ICD-10-CM

## 2023-11-05 DIAGNOSIS — M549 Dorsalgia, unspecified: Secondary | ICD-10-CM | POA: Insufficient documentation

## 2023-11-05 DIAGNOSIS — J45909 Unspecified asthma, uncomplicated: Secondary | ICD-10-CM | POA: Diagnosis not present

## 2023-11-05 HISTORY — PX: UMBILICAL HERNIA REPAIR: SHX196

## 2023-11-05 LAB — GLUCOSE, CAPILLARY
Glucose-Capillary: 124 mg/dL — ABNORMAL HIGH (ref 70–99)
Glucose-Capillary: 111 mg/dL — ABNORMAL HIGH (ref 70–99)

## 2023-11-05 SURGERY — REPAIR, HERNIA, UMBILICAL, ADULT
Anesthesia: General | Site: Abdomen

## 2023-11-05 MED ORDER — ACETAMINOPHEN 500 MG PO TABS: 1000.0000 mg | ORAL_TABLET | ORAL | Status: DC

## 2023-11-05 MED ORDER — ROCURONIUM BROMIDE 10 MG/ML (PF) SYRINGE
PREFILLED_SYRINGE | INTRAVENOUS | Status: DC | PRN
  Administered 2023-11-05: 50 mg via INTRAVENOUS

## 2023-11-05 MED ORDER — SUGAMMADEX SODIUM 200 MG/2ML IV SOLN
INTRAVENOUS | Status: DC | PRN
  Administered 2023-11-05: 200 mg via INTRAVENOUS

## 2023-11-05 MED ORDER — BUPIVACAINE HCL (PF) 0.25 % IJ SOLN
INTRAMUSCULAR | Status: AC
  Filled 2023-11-05: qty 30

## 2023-11-05 MED ORDER — BUPIVACAINE-EPINEPHRINE 0.25% -1:200000 IJ SOLN
INTRAMUSCULAR | Status: DC | PRN
  Administered 2023-11-05: 30 mL

## 2023-11-05 MED ORDER — ROCURONIUM BROMIDE 10 MG/ML (PF) SYRINGE
PREFILLED_SYRINGE | INTRAVENOUS | Status: AC
  Filled 2023-11-05: qty 10

## 2023-11-05 MED ORDER — ONDANSETRON HCL 4 MG/2ML IJ SOLN
INTRAMUSCULAR | Status: AC
  Filled 2023-11-05: qty 2

## 2023-11-05 MED ORDER — LACTATED RINGERS IV SOLN
INTRAVENOUS | Status: DC
Start: 2023-11-05 — End: 2023-11-05

## 2023-11-05 MED ORDER — DEXAMETHASONE SODIUM PHOSPHATE 10 MG/ML IJ SOLN
INTRAMUSCULAR | Status: DC | PRN
  Administered 2023-11-05: 10 mg via INTRAVENOUS

## 2023-11-05 MED ORDER — PHENYLEPHRINE HCL-NACL 20-0.9 MG/250ML-% IV SOLN
INTRAVENOUS | Status: DC | PRN
  Administered 2023-11-05: 35 ug/min via INTRAVENOUS

## 2023-11-05 MED ORDER — LIDOCAINE HCL (PF) 2 % IJ SOLN
INTRAMUSCULAR | Status: AC
  Filled 2023-11-05: qty 5

## 2023-11-05 MED ORDER — OXYCODONE HCL 5 MG PO TABS: 5.0000 mg | ORAL_TABLET | Freq: Four times a day (QID) | ORAL | 0 refills | Status: AC | PRN

## 2023-11-05 MED ORDER — DEXAMETHASONE SODIUM PHOSPHATE 10 MG/ML IJ SOLN
INTRAMUSCULAR | Status: AC
  Filled 2023-11-05: qty 1

## 2023-11-05 MED ORDER — MIDAZOLAM HCL 2 MG/2ML IJ SOLN
INTRAMUSCULAR | Status: AC
  Filled 2023-11-05: qty 2

## 2023-11-05 MED ORDER — CHLORHEXIDINE GLUCONATE 0.12 % MT SOLN
15.0000 mL | Freq: Once | OROMUCOSAL | Status: AC
  Administered 2023-11-05: 15 mL via OROMUCOSAL

## 2023-11-05 MED ORDER — CHLORHEXIDINE GLUCONATE CLOTH 2 % EX PADS: 6.0000 | MEDICATED_PAD | Freq: Once | CUTANEOUS | Status: DC

## 2023-11-05 MED ORDER — ORAL CARE MOUTH RINSE: 15.0000 mL | Freq: Once | OROMUCOSAL | Status: AC

## 2023-11-05 MED ORDER — FENTANYL CITRATE (PF) 100 MCG/2ML IJ SOLN
INTRAMUSCULAR | Status: AC
  Filled 2023-11-05: qty 2

## 2023-11-05 MED ORDER — FENTANYL CITRATE (PF) 100 MCG/2ML IJ SOLN
INTRAMUSCULAR | Status: DC | PRN
  Administered 2023-11-05: 50 ug via INTRAVENOUS
  Administered 2023-11-05 (×2): 25 ug via INTRAVENOUS

## 2023-11-05 MED ORDER — LIDOCAINE HCL (PF) 2 % IJ SOLN
INTRAMUSCULAR | Status: DC | PRN
  Administered 2023-11-05: 70 mg via INTRADERMAL

## 2023-11-05 MED ORDER — SUGAMMADEX SODIUM 200 MG/2ML IV SOLN
INTRAVENOUS | Status: AC
  Filled 2023-11-05: qty 2

## 2023-11-05 MED ORDER — CEFAZOLIN SODIUM-DEXTROSE 2-4 GM/100ML-% IV SOLN
2.0000 g | INTRAVENOUS | Status: AC
  Administered 2023-11-05: 2 g via INTRAVENOUS
  Filled 2023-11-05: qty 100

## 2023-11-05 MED ORDER — PROPOFOL 10 MG/ML IV BOLUS
INTRAVENOUS | Status: AC
  Filled 2023-11-05: qty 20

## 2023-11-05 MED ORDER — ONDANSETRON HCL 4 MG/2ML IJ SOLN
INTRAMUSCULAR | Status: DC | PRN
  Administered 2023-11-05: 4 mg via INTRAVENOUS

## 2023-11-05 MED ORDER — DROPERIDOL 2.5 MG/ML IJ SOLN: 0.6250 mg | Freq: Once | INTRAMUSCULAR | Status: DC | PRN

## 2023-11-05 MED ORDER — PHENYLEPHRINE 80 MCG/ML (10ML) SYRINGE FOR IV PUSH (FOR BLOOD PRESSURE SUPPORT)
PREFILLED_SYRINGE | INTRAVENOUS | Status: DC | PRN
  Administered 2023-11-05 (×2): 80 ug via INTRAVENOUS

## 2023-11-05 MED ORDER — OXYCODONE HCL 5 MG PO TABS: 5.0000 mg | ORAL_TABLET | Freq: Once | ORAL | Status: DC | PRN

## 2023-11-05 MED ORDER — FENTANYL CITRATE PF 50 MCG/ML IJ SOSY: 25.0000 ug | PREFILLED_SYRINGE | INTRAMUSCULAR | Status: DC | PRN

## 2023-11-05 MED ORDER — ACETAMINOPHEN 500 MG PO TABS
1000.0000 mg | ORAL_TABLET | Freq: Once | ORAL | Status: AC
  Administered 2023-11-05: 1000 mg via ORAL
  Filled 2023-11-05: qty 2

## 2023-11-05 MED ORDER — PROPOFOL 10 MG/ML IV BOLUS
INTRAVENOUS | Status: DC | PRN
  Administered 2023-11-05: 100 mg via INTRAVENOUS

## 2023-11-05 MED ORDER — OXYCODONE HCL 5 MG/5ML PO SOLN: 5.0000 mg | Freq: Once | ORAL | Status: DC | PRN

## 2023-11-05 MED ORDER — MIDAZOLAM HCL 5 MG/5ML IJ SOLN
INTRAMUSCULAR | Status: DC | PRN
  Administered 2023-11-05: 2 mg via INTRAVENOUS

## 2023-11-05 MED ORDER — PHENYLEPHRINE HCL (PRESSORS) 10 MG/ML IV SOLN: INTRAVENOUS | Status: DC | PRN

## 2023-11-05 SURGICAL SUPPLY — 25 items
BAG COUNTER SPONGE SURGICOUNT (BAG) IMPLANT
BINDER ABDOMINAL 12 ML 46-62 (SOFTGOODS) IMPLANT
BLADE SURG SZ10 CARB STEEL (BLADE) ×1 IMPLANT
CHLORAPREP W/TINT 26 (MISCELLANEOUS) ×1 IMPLANT
COVER SURGICAL LIGHT HANDLE (MISCELLANEOUS) ×1 IMPLANT
DERMABOND ADVANCED .7 DNX12 (GAUZE/BANDAGES/DRESSINGS) ×1 IMPLANT
DRAPE LAPAROSCOPIC ABDOMINAL (DRAPES) ×1 IMPLANT
ELECT REM PT RETURN 15FT ADLT (MISCELLANEOUS) ×1 IMPLANT
GLOVE BIOGEL PI IND STRL 7.0 (GLOVE) ×1 IMPLANT
GLOVE SURG SS PI 7.0 STRL IVOR (GLOVE) ×1 IMPLANT
GOWN STRL REUS W/ TWL LRG LVL3 (GOWN DISPOSABLE) ×1 IMPLANT
KIT BASIN OR (CUSTOM PROCEDURE TRAY) ×2 IMPLANT
KIT TURNOVER KIT A (KITS) IMPLANT
MARKER SKIN DUAL TIP RULER LAB (MISCELLANEOUS) ×1 IMPLANT
NDL HYPO 22X1.5 SAFETY MO (MISCELLANEOUS) ×1 IMPLANT
NEEDLE HYPO 22X1.5 SAFETY MO (MISCELLANEOUS) ×1 IMPLANT
PACK GENERAL/GYN (CUSTOM PROCEDURE TRAY) ×1 IMPLANT
SPIKE FLUID TRANSFER (MISCELLANEOUS) ×1 IMPLANT
SUT MNCRL AB 4-0 PS2 18 (SUTURE) ×1 IMPLANT
SUT NOVA NAB GS-21 0 18 T12 DT (SUTURE) IMPLANT
SUT PDS AB 0 CT1 36 (SUTURE) IMPLANT
SUT VIC AB 3-0 SH 27X BRD (SUTURE) ×1 IMPLANT
SYR CONTROL 10ML LL (SYRINGE) ×1 IMPLANT
TOWEL OR 17X26 10 PK STRL BLUE (TOWEL DISPOSABLE) ×1 IMPLANT
YANKAUER SUCT BULB TIP 10FT TU (MISCELLANEOUS) ×1 IMPLANT

## 2023-11-05 NOTE — H&P (Signed)
 Chief Complaint  Patient presents with  Follow-up  2 mth LTFU diaphragmatic hernia   Subjective   [name] is a 63 y.o. female new patient in today for: History of Present Illness The patient, with a history of diabetes and smoking, presents with ongoing abdominal pain. The pain is not constant, but is frequent and requires significant walking to alleviate. The pain is located in the umbilical region and is exacerbated by pressure. The patient denies any other symptoms associated with the pain.  The patient's diabetes is managed with insulin and she reports good control, with occasional hypoglycemic episodes when she does not eat. She smokes half a pack of cigarettes daily. She also reports a history of asthma, which is currently under control.  The patient has been experiencing heartburn and reflux symptoms, which she manages with Nexium. She also reports a history of a hernia that was pushing against her lungs, but it is unclear if this is related to her current symptoms.  Social Drivers of Health with Concerns   Tobacco Use: Low Risk (08/18/2023)  Patient History  Smoking Tobacco Use: Never  Smokeless Tobacco Use: Never  Recent Concern: Tobacco Use - High Risk (07/21/2023)  [facility] from [facility]  Patient History  Smoking Tobacco Use: Every Day  Smokeless Tobacco Use: Never  Received from [facility]  Social Network    No data to display      Outpatient Medications Prior to Visit  Medication Sig Dispense Refill  albuterol 90 mcg/actuation inhaler ProAir HFA 90 mcg/actuation aerosol inhaler Inhale 2 puffs every 4 hours by inhalation route.  amLODIPine (NORVASC) 5 MG tablet Take 5 mg by mouth once daily  aspirin 81 MG EC tablet Take 1 tablet by mouth once daily  atorvastatin (LIPITOR) 80 MG tablet Take by mouth  carvediloL (COREG) 12.5 MG tablet Take 1 tablet by mouth 2 (two) times daily with meals  ciclopirox (PENLAC) 8 % topical nail solution Apply  topically  clopidogreL (PLAVIX) 75 mg tablet Take 75 mg by mouth once daily  esomeprazole (NEXIUM) 40 MG DR capsule Take 40 mg by mouth once daily  HYDROcodone-acetaminophen (NORCO) 7.5-325 mg tablet Take 1 tablet by mouth 4 (four) times daily as needed  insulin GLARGINE (LANTUS) injection (concentration 100 units/mL) Inject subcutaneously  levothyroxine (SYNTHROID) 100 MCG tablet Take by mouth  lisinopriL (ZESTRIL) 20 MG tablet Take by mouth  metFORMIN (GLUCOPHAGE) 1000 MG tablet Take by mouth   No facility-administered medications prior to visit.   Review of Systems  Constitutional: Negative.  HENT: Negative.  Eyes: Negative.  Respiratory: Negative.  Cardiovascular: Negative.  Gastrointestinal: Positive for abdominal pain.  Genitourinary: Negative.  Musculoskeletal: Negative.  Skin: Negative.  Neurological: Positive for weakness.  Endo/Heme/Allergies: Negative.  Psychiatric/Behavioral: Negative.    Objective   Vitals:  08/18/23 0841  PainSc: 0-No pain   There is no height or weight on file to calculate BMI. Physical Exam Constitutional:  Appearance: Normal appearance. She is not ill-appearing.  HENT:  Head: Normocephalic and atraumatic.  Cardiovascular:  Heart sounds: No murmur heard. No friction rub.  Pulmonary:  Effort: No respiratory distress.  Breath sounds: No stridor.  Abdominal:  General: There is no distension.  Palpations: There is no mass.  Tenderness: There is no abdominal tenderness.  Musculoskeletal:  General: No swelling or deformity.  Cervical back: No rigidity or tenderness.  Neurological:  General: No focal deficit present.  Mental Status: She is oriented to person, place, and time. Mental status is  at baseline.  Psychiatric:  Mood and Affect: Mood normal.   Results LABS Blood glucose: 45 mg/dL  RADIOLOGY CT scan of abdomen: Small hiatal hernia, small umbilical hernia, no other hernias, small contusion likely from  insulin   Assessment/Plan:   Assessment & Plan Umbilical Hernia Moderate, persistent pain. CT scan confirmed small umbilical hernia. The patient has a symptomatic reducible hernia. We discussed the etiology of his hernia, the risk of it enlarging, incarceration, obstruction, strangulation, and that it is unlikely to get smaller or better on its own. We discussed operative options of laparoscopic vs open repair with mesh including the risks of recurrence, injury to intestines or abdominal organs, or chronic pain associated with mesh. We decided to proceed with open umbilical hernia repair with possible mesh.  -Schedule surgical repair of umbilical hernia.  Diabetes Mellitus Patient on insulin therapy. Recent episode of hypoglycemia reported. -Continue current insulin regimen. -Encourage regular meals to prevent hypoglycemia.  Tobacco Use Half a pack per day. Discussed increased risk of hernia recurrence and complications with diabetes. -Strongly advise smoking cessation.  Gastroesophageal Reflux Disease (GERD) Reports heartburn. Currently on Nexium. -Continue Nexium as prescribed.  Follow-up -Expect a phone call to schedule surgery. -Post-operative visit to assess recovery.  Diagnoses and all orders for this visit:  Tobacco abuse

## 2023-11-05 NOTE — Op Note (Signed)
 PATIENT:  [name]  64 y.o. female  PRE-OPERATIVE DIAGNOSIS:  UMBILICAL HERNIA  POST-OPERATIVE DIAGNOSIS:  UMBILICAL HERNIA  PROCEDURE:  Procedure(s): OPEN UMBILICAL 1 cm HERNIA REPAIR    SURGEON:  Surgeon(s): Sharalyn Lomba, De Blanch, MD  ASSISTANT: none   ANESTHESIA:   local and general  Indications for procedure: [name] is a 63 y.o. year old female with symptoms of abdominal pain and CT scan showing small umbilical hernia. She presents for hernia repair.  Description of procedure: The patient was brought into the operative suite. Anesthesia was administered with General endotracheal anesthesia. WHO checklist was applied. The patient was then placed in supine. The area was prepped and draped in the usual sterile fashion.  Next the infraumbilical skin was anesthetized with Marcaine. A semilunar infraumbilical incision was made. Cautery and blunt dissection was used to dissect down to the fascia. The hernia sac was dissected free from surrounding tissues in 360 degrees. The umbilical skin was dissected free of the hernia sac with cautery. The hernia sac was reduced and contained no visceral structures. The hernia defect was 1 cm in diameter. The hernia sac was removed. Due to the size of the hernia, Due to size of hernia, no mesh was utilized. The fascial defect was then primarily closed with interrupted 0 PDS sutures. The umbilical skin was sutured to the fascia with a 3-0 vicryl. The deep dermal space was closed with a 3-0 vicryl. Marcaine was injected into the muscle layer and around the fascia. The skin was closed with a 4-0 monocryl subcuticular suture. Dermabond was put in place for dressing. The patient awoke from anesthesia and was brought to pacu in stable condition. All counts were correct.  Findings: 1 cm umbilical hernia  Specimen: none  Blood loss: 10 ml ml  Local anesthesia: 30 ml Marcaine  Complications: none  PLAN OF CARE: Discharge to home after  PACU  PATIENT DISPOSITION:  PACU - hemodynamically stable.  Feliciana Rossetti, M.D. General, Bariatric, & Minimally Invasive Surgery [facility], [place]  11/05/2023 8:14 AM

## 2023-11-05 NOTE — Transfer of Care (Signed)
 Immediate Anesthesia Transfer of Care Note  Patient: [name]  Procedure(s) Performed: OPEN UMBILICAL HERNIA REPAIR WITH POSSIBLE MESH (Abdomen)  Patient Location: PACU  Anesthesia Type:General  Level of Consciousness: awake, alert , and oriented  Airway & Oxygen Therapy: Patient Spontanous Breathing and Patient connected to face mask oxygen  Post-op Assessment: Report given to RN and Post -op Vital signs reviewed and stable  Post vital signs: Reviewed and stable  Last Vitals:  Vitals Value Taken Time  BP 106/53 11/05/23 0824  Temp    Pulse 60 11/05/23 0825  Resp 14 11/05/23 0825  SpO2 98 % 11/05/23 0825  Vitals shown include unfiled device data.  Last Pain:  Vitals:   11/05/23 0554  TempSrc:   PainSc: 0-No pain         Complications: No notable events documented.

## 2023-11-05 NOTE — Anesthesia Postprocedure Evaluation (Signed)
 Anesthesia Post Note  Patient: [name]  Procedure(s) Performed: OPEN UMBILICAL HERNIA REPAIR WITH POSSIBLE MESH (Abdomen)     Patient location during evaluation: PACU Anesthesia Type: General Level of consciousness: sedated and patient cooperative Pain management: pain level controlled Vital Signs Assessment: post-procedure vital signs reviewed and stable Respiratory status: spontaneous breathing Cardiovascular status: stable Anesthetic complications: no Comments: Marginal SpO2 in PACU. CXR obtained which appeared stable from prior by my read. I evaluated her in PACU. Breath sounds decreased, but no active wheezing, rales or rhonchi. With deep breathing I got her to 100% on SpO2 on room air.   No notable events documented.  Last Vitals:  Vitals:   11/05/23 1045 11/05/23 1055  BP: 117/67 120/60  Pulse: 62 63  Resp: (!) 24 18  Temp:  36.6 C  SpO2: 91% 95%    Last Pain:  Vitals:   11/05/23 1055  TempSrc:   PainSc: 0-No pain                 Lewie Loron

## 2023-11-05 NOTE — Discharge Instructions (Signed)

## 2023-11-05 NOTE — Anesthesia Procedure Notes (Signed)
 Procedure Name: Intubation Date/Time: 11/05/2023 7:44 AM  Performed by: Orest Dikes, CRNAPre-anesthesia Checklist: Patient identified, Emergency Drugs available, Suction available and Patient being monitored Patient Re-evaluated:Patient Re-evaluated prior to induction Oxygen Delivery Method: Circle system utilized Preoxygenation: Pre-oxygenation with 100% oxygen Induction Type: IV induction Ventilation: Mask ventilation without difficulty Laryngoscope Size: Mac and 4 Grade View: Grade I Tube type: Oral Tube size: 7.0 mm Number of attempts: 1 Airway Equipment and Method: Stylet Placement Confirmation: ETT inserted through vocal cords under direct vision, positive ETCO2 and breath sounds checked- equal and bilateral Secured at: 22 cm Tube secured with: Tape Dental Injury: Teeth and Oropharynx as per pre-operative assessment

## 2023-11-06 ENCOUNTER — Encounter (HOSPITAL_COMMUNITY): Payer: Self-pay | Admitting: General Surgery

## 2023-12-06 ENCOUNTER — Institutional Professional Consult (permissible substitution) (INDEPENDENT_AMBULATORY_CARE_PROVIDER_SITE_OTHER): Admitting: Otolaryngology

## 2023-12-10 ENCOUNTER — Ambulatory Visit

## 2023-12-21 ENCOUNTER — Ambulatory Visit

## 2024-01-04 ENCOUNTER — Ambulatory Visit

## 2024-01-11 ENCOUNTER — Ambulatory Visit
Admission: RE | Admit: 2024-01-11 | Discharge: 2024-01-11 | Disposition: A | Discharge status: Home / Self Care | Source: Ambulatory Visit | Attending: Internal Medicine | Admitting: Internal Medicine

## 2024-01-11 DIAGNOSIS — Z1231 Encounter for screening mammogram for malignant neoplasm of breast: Secondary | ICD-10-CM

## 2024-02-01 ENCOUNTER — Encounter: Payer: Self-pay | Admitting: Pulmonary Disease

## 2024-02-01 ENCOUNTER — Ambulatory Visit (INDEPENDENT_AMBULATORY_CARE_PROVIDER_SITE_OTHER): Admitting: Pulmonary Disease

## 2024-02-01 VITALS — BP 120/71 | HR 58 | Ht 62.0 in | Wt 152.0 lb

## 2024-02-01 DIAGNOSIS — R0609 Other forms of dyspnea: Secondary | ICD-10-CM | POA: Diagnosis not present

## 2024-02-01 DIAGNOSIS — F17210 Nicotine dependence, cigarettes, uncomplicated: Secondary | ICD-10-CM

## 2024-02-01 DIAGNOSIS — J449 Chronic obstructive pulmonary disease, unspecified: Secondary | ICD-10-CM | POA: Diagnosis not present

## 2024-02-01 LAB — PULMONARY FUNCTION TEST
DL/VA % pred: 58 %
DL/VA: 2.47 ml/min/mmHg/L
DLCO unc % pred: 48 %
DLCO unc: 8.96 ml/min/mmHg
FEF 25-75 Post: 0.7 L/s
FEF 25-75 Pre: 0.5 L/s
FEF2575-%Change-Post: 38 %
FEF2575-%Pred-Post: 33 %
FEF2575-%Pred-Pre: 24 %
FEV1-%Change-Post: 8 %
FEV1-%Pred-Post: 50 %
FEV1-%Pred-Pre: 46 %
FEV1-Post: 1.13 L
FEV1-Pre: 1.04 L
FEV1FVC-%Change-Post: 0 %
FEV1FVC-%Pred-Pre: 80 %
FEV6-%Change-Post: 8 %
FEV6-%Pred-Post: 62 %
FEV6-%Pred-Pre: 57 %
FEV6-Post: 1.77 L
FEV6-Pre: 1.63 L
FEV6FVC-%Change-Post: 0 %
FEV6FVC-%Pred-Post: 101 %
FEV6FVC-%Pred-Pre: 101 %
FVC-%Change-Post: 8 %
FVC-%Pred-Post: 61 %
FVC-%Pred-Pre: 56 %
FVC-Post: 1.82 L
FVC-Pre: 1.67 L
Post FEV1/FVC ratio: 62 %
Post FEV6/FVC ratio: 97 %
Pre FEV1/FVC ratio: 62 %
Pre FEV6/FVC Ratio: 97 %
RV % pred: 150 %
RV: 2.95 L
TLC % pred: 104 %
TLC: 4.99 L

## 2024-02-01 NOTE — Progress Notes (Signed)
 @Patient  ID: [name], female    DOB: 1961/01/07, 63 y.o.   MRN: [id number]  Chief Complaint  Patient presents with  . Follow-up    Pt states she has a pain soemtimes when turning over or taking breath x 64 month    Referring provider: Austin Mutton, MD  HPI:   63 y.o. woman with significant coronary artery disease status post PCI times 09/22/2016 whom we are seeing for evaluation of dyspnea on exertion.  ED note 12/24 reviewed.  Return to routine follow-up.  PFTs never scheduled from last visit.  She was prescribed Stiolto.  She does think this helps.  Use albuterol  as needed.  With some improvement as well.  Still dyspneic but better than baseline prior to inhalers.  She continues to have hoarse voice.  Present for some time.  We discussed possible etiologies including reflux, nonacidic given the lack of heartburn symptoms and her PPI therapy, sequela of prior thyroid  surgery but this was many many years ago, some new anatomic variation, abnormality.  Discussed ENT referral.  She is hesitant on fiberoptic laryngoscopy but I encouraged her to pursue this.  HPI at initial visit: She notes dyspnea for the last year or so.  Only with exertion.  No shortness of breath at rest.  Not at night.  No time of day when things are better or worse.  No position that makes things better or worse.  No seasonal or environmental factors she can identify to make things better or worse.  In the midst of the symptoms labs were checked, hemoglobin of 12.  She has had cross-sectional imaging x 2 over the last year.  CT PE protocol 03/2022 with fat hernias and small sliding hernia, otherwise clear lungs no discussion of emphysema on my review interpretation.  CT low-dose screening 07/2022 with clear lungs described bilaterally, no mention of nodule or lesion or abnormality such as emphysema on my review.  She has been seen by cardiology.  No TTE since 2018 that I can see.  There are perfusion studies 2018 that indicated  normal EF.  Notably at time of her catheterization and PCI in 2018 estimated EF was 45% on LV gram.  Questionaires / Pulmonary Flowsheets:   ACT:      No data to display          MMRC:     No data to display          Epworth:      No data to display          Tests:   FENO:  No results found for: NITRICOXIDE  PFT:    Latest Ref Rng & Units 02/01/2024    8:37 AM  PFT Results  FVC-Pre L 1.67  P  FVC-Predicted Pre % 56  P  FVC-Post L 1.82  P  FVC-Predicted Post % 61  P  Pre FEV1/FVC % % 62  P  Post FEV1/FCV % % 62  P  FEV1-Pre L 1.04  P  FEV1-Predicted Pre % 46  P  FEV1-Post L 1.13  P  DLCO uncorrected ml/min/mmHg 8.96  P  DLCO UNC% % 48  P  DLVA Predicted % 58  P  TLC L 4.99  P  TLC % Predicted % 104  P  RV % Predicted % 150  P    P Preliminary result    WALK:      No data to display  Imaging: Personally reviewed and as per EMR discussion this note MM 3D SCREENING MAMMOGRAM BILATERAL BREAST Result Date: 01/13/2024 CLINICAL DATA:  Screening. EXAM: DIGITAL SCREENING BILATERAL MAMMOGRAM WITH TOMOSYNTHESIS AND CAD TECHNIQUE: Bilateral screening digital craniocaudal and mediolateral oblique mammograms were obtained. Bilateral screening digital breast tomosynthesis was performed. The images were evaluated with computer-aided detection. COMPARISON:  Previous exam(s). ACR Breast Density Category b: There are scattered areas of fibroglandular density. FINDINGS: There are no findings suspicious for malignancy. IMPRESSION: No mammographic evidence of malignancy. A result letter of this screening mammogram will be mailed directly to the patient. RECOMMENDATION: Screening mammogram in one year. (Code:SM-B-01Y) BI-RADS CATEGORY  1: Negative. Electronically Signed   By: Craig Farr M.D.   On: 01/13/2024 11:31    Lab Results: Personally reviewed, no significant anemia on most recent labs 03/2022 via care everywhere CBC    Component Value Date/Time    WBC 10.7 (H) 10/20/2023 0853   RBC 5.10 10/20/2023 0853   HGB 14.7 10/20/2023 0853   HGB 13.6 05/25/2017 1219   HCT 45.2 10/20/2023 0853   HCT 40.7 05/25/2017 1219   PLT 227 10/20/2023 0853   PLT 256 05/25/2017 1219   MCV 88.6 10/20/2023 0853   MCV 85 05/25/2017 1219   MCH 28.8 10/20/2023 0853   MCHC 32.5 10/20/2023 0853   RDW 15.3 10/20/2023 0853   RDW 16.3 (H) 05/25/2017 1219   LYMPHSABS 1.9 12/01/2019 1143   MONOABS 0.6 12/01/2019 1143   EOSABS 0.1 12/01/2019 1143   BASOSABS 0.0 12/01/2019 1143    BMET    Component Value Date/Time   NA 140 10/20/2023 0853   NA 139 05/25/2017 1219   K 3.4 (L) 10/20/2023 0853   CL 102 10/20/2023 0853   CO2 28 10/20/2023 0853   GLUCOSE 96 10/20/2023 0853   BUN 13 10/20/2023 0853   BUN 14 05/25/2017 1219   CREATININE 0.74 10/20/2023 0853   CALCIUM  9.4 10/20/2023 0853   GFRNONAA >60 10/20/2023 0853   GFRAA >60 12/05/2019 1826    BNP    Component Value Date/Time   BNP 96.0 07/21/2023 1157    ProBNP No results found for: PROBNP  Specialty Problems       Pulmonary Problems   Shortness of breath on exertion   Hypoxia    Allergies  Allergen Reactions  . Motrin [Ibuprofen] Hives  . Bactrim [Sulfamethoxazole-Trimethoprim] Rash  . Lisinopril  Cough  . Sulfa Antibiotics Hives and Rash    Immunization History  Administered Date(s) Administered  . PFIZER(Purple Top)SARS-COV-2 Vaccination 11/13/2019    Past Medical History:  Diagnosis Date  . Abdominal pain, right lower quadrant   . Abdominal wall pain   . Arterial bruit   . Arthritis   . Arthropathy   . Asthma   . Barrett's esophagus   . Callus   . Carotid stenosis, bilateral   . Chest pain in adult   . Chronic back pain   . Colon polyp   . Coronary artery disease, occlusive    stent x 2  . Depression   . Diabetic neuropathy (HCC) 06/02/2018   type 2  . Eosinophilic esophagitis   . Eosinophilic gastritis   . Fatigue   . Gastritis   . Gastroesophagitis   .  GERD (gastroesophageal reflux disease)   . H. pylori infection   . H/O: hysterectomy   . Hypersomnia   . Hypertension   . Hypothyroidism   . IBS (irritable bowel syndrome)   . Inflammatory bowel disease  colitis  . Inlet patch of esophagus   . Myocardial infarction (HCC)   . Neuromuscular disorder (HCC)    restless leg syndrome  . Pneumonia   . Precordial pain   . PVD (peripheral vascular disease) (HCC)    pt. denies  . Restless leg syndrome   . Right foot pain   . Shortness of breath on exertion   . Shoulder pain, right   . Ulcer of esophagus without bleeding     Tobacco History: Social History   Tobacco Use  Smoking Status Every Day  . Current packs/day: 0.50  . Average packs/day: 0.5 packs/day for 25.0 years (12.5 ttl pk-yrs)  . Types: Cigarettes  Smokeless Tobacco Never  Tobacco Comments   Trying to stop smoking   Ready to quit: Not Answered Counseling given: Not Answered Tobacco comments: Trying to stop smoking   Continue to not smoke  Outpatient Encounter Medications as of 02/01/2024  Medication Sig  . albuterol  (VENTOLIN  HFA) 108 (90 Base) MCG/ACT inhaler Inhale 2 puffs into the lungs every 6 (six) hours as needed for wheezing or shortness of breath.  . amLODipine  (NORVASC ) 10 MG tablet Take 10 mg by mouth daily.  . amLODipine -olmesartan (AZOR) 10-40 MG tablet Take 1 tablet by mouth daily.  . aspirin  EC 81 MG tablet Take 81 mg by mouth daily.  SABRA atenolol (TENORMIN) 25 MG tablet Take 25 mg by mouth daily.  . atorvastatin  (LIPITOR) 20 MG tablet Take 20 mg by mouth at bedtime.  . azithromycin  (ZITHROMAX ) 250 MG tablet Take 1 tablet (250 mg total) by mouth daily. Take first 2 tablets together, then 1 every day until finished. (Patient not taking: Reported on 10/15/2023)  . benzonatate (TESSALON) 100 MG capsule Take by mouth 2 (two) times daily as needed for cough.  . cholecalciferol (VITAMIN D3) 25 MCG (1000 UNIT) tablet Take 1,000 Units by mouth daily.  .  clopidogrel  (PLAVIX ) 75 MG tablet Take 1 tablet (75 mg total) by mouth daily. Please schedule annual appt with Dr. Court for refills. [phone]. 1st attempt. (Patient not taking: Reported on 10/15/2023)  . diclofenac Sodium (VOLTAREN) 1 % GEL Apply 4 g topically 4 (four) times daily.  . HYDROcodone -acetaminophen  (NORCO) 7.5-325 MG tablet Take 1 tablet by mouth 3 (three) times daily as needed for moderate pain. (Patient not taking: Reported on 10/15/2023)  . levothyroxine  (SYNTHROID ) 100 MCG tablet Take 100 mcg by mouth daily before breakfast.  . metFORMIN (GLUCOPHAGE) 500 MG tablet Take 500 mg by mouth 3 (three) times daily with meals.  . nitroGLYCERIN  (NITROSTAT ) 0.4 MG SL tablet Place 1 tablet (0.4 mg total) under the tongue every 5 (five) minutes as needed for chest pain. (Patient not taking: Reported on 10/15/2023)  . omeprazole (PRILOSEC) 40 MG capsule Take 40 mg by mouth daily.  . oxyCODONE  (OXY IR/ROXICODONE ) 5 MG immediate release tablet Take 1 tablet (5 mg total) by mouth every 6 (six) hours as needed for severe pain (pain score 7-10).  . Tiotropium Bromide-Olodaterol (STIOLTO RESPIMAT ) 2.5-2.5 MCG/ACT AERS Inhale 2 puffs into the lungs daily.  . TRESIBA FLEXTOUCH 200 UNIT/ML SOPN Inject 60 Units into the skin in the morning.   No facility-administered encounter medications on file as of 02/01/2024.     Review of Systems  Review of Systems  N/a Physical Exam  BP 120/71 (BP Location: Left Arm, Patient Position: Sitting, Cuff Size: Normal)   Pulse (!) 58   Ht 5' 2 (1.575 m)   Wt 152 lb (68.9 kg)  SpO2 98%   BMI 27.80 kg/m   Wt Readings from Last 5 Encounters:  02/01/24 152 lb (68.9 kg)  11/05/23 162 lb (73.5 kg)  10/20/23 162 lb (73.5 kg)  10/06/23 161 lb 6.4 oz (73.2 kg)  04/22/23 162 lb (73.5 kg)    BMI Readings from Last 5 Encounters:  02/01/24 27.80 kg/m  11/05/23 29.63 kg/m  10/20/23 29.63 kg/m  10/06/23 29.52 kg/m  04/22/23 29.63 kg/m     Physical  Exam General: Sitting in chair, no acute distress Eyes: EOMI, no icterus Neck: Supple, no JVP appreciated sitting upright Abdomen: Nondistended, bowel sounds present MSK: No synovitis, no joint effusion Neuro: Normal gait, no weakness Psych: Normal mood, full affect Pulmonary: Clear, no work of breathing Cardiovascular: Warm, no edema   Assessment & Plan:   Dyspnea on exertion: Suspect multifactorial.  Possibly related to habitus, deconditioning, smoking-related lung disease, known CAD.  Hiatal hernia, fat hernia into thorax could be a mild contributor to restrictive physiology but unlikely to cause significant symptoms in isolation.  Cross-sectional imaging of the lungs reports normal findings without discussion of emphysema.  Will obtain PFTs for further evaluation.  Given her history of smoking, trial of dual bronchodilators with Stiolto.  Albuterol  rescue inhaler.  Both of given improvement..  Last cardiology note read mentions repeat TTE, yet to be done.  I think this will be informative.  Voice hoarseness: Unclear etiology, no mucus production.  She has reflux, this is the most likely cause, posterior pharynx ongoing inflammation.  Fortunately her acid is well treated with PPI but suspect silent reflux.  She had thyroid  surgery many many years ago.  This is quite possibly a culprit.  Referral to ENT today for evaluation of voice hoarseness, video laryngoscopy to rule out anatomic issues or to diagnose issues I cannot see.  She is leery of this given bad experience in the past but I encouraged her to pursue this.   No follow-ups on file.   Donnice JONELLE Beals, MD 02/01/2024

## 2024-02-01 NOTE — Progress Notes (Signed)
 Full pft performed today.

## 2024-02-01 NOTE — Progress Notes (Signed)
 @Patient  ID: [name], female    DOB: 11/23/1960, 63 y.o.   MRN: [id number]  Chief Complaint  Patient presents with   Follow-up    Pt states she has a pain soemtimes when turning over or taking breath x 1 month    Referring provider: Austin Mutton, MD  HPI:   63 y.o. woman with significant coronary artery disease status post PCI times 09/22/2016 whom we are seeing for evaluation of dyspnea on exertion.  ED note 12/24 reviewed.  Return to routine follow-up.  PFTs performed today.  Consistent with severe COPD.  With air trapping.  DLCO severely reduced.  Has been using Stiolto in the interim.  With improved symptoms.  Had hernia surgery in the interim.  This also helped her symptoms.  Stable to walk more less dyspnea.  We discussed her PFTs in detail and why the Stiolto would likely help.  HPI at initial visit: She notes dyspnea for the last year or so.  Only with exertion.  No shortness of breath at rest.  Not at night.  No time of day when things are better or worse.  No position that makes things better or worse.  No seasonal or environmental factors she can identify to make things better or worse.  In the midst of the symptoms labs were checked, hemoglobin of 12.  She has had cross-sectional imaging x 2 over the last year.  CT PE protocol 03/2022 with fat hernias and small sliding hernia, otherwise clear lungs no discussion of emphysema on my review interpretation.  CT low-dose screening 07/2022 with clear lungs described bilaterally, no mention of nodule or lesion or abnormality such as emphysema on my review.  She has been seen by cardiology.  No TTE since 2018 that I can see.  There are perfusion studies 2018 that indicated normal EF.  Notably at time of her catheterization and PCI in 2018 estimated EF was 45% on LV gram.  Questionaires / Pulmonary Flowsheets:   ACT:      No data to display          MMRC:     No data to display          Epworth:      No data to display           Tests:   FENO:  No results found for: NITRICOXIDE  PFT:    Latest Ref Rng & Units 02/01/2024    8:37 AM  PFT Results  FVC-Pre L 1.67  P  FVC-Predicted Pre % 56  P  FVC-Post L 1.82  P  FVC-Predicted Post % 61  P  Pre FEV1/FVC % % 62  P  Post FEV1/FCV % % 62  P  FEV1-Pre L 1.04  P  FEV1-Predicted Pre % 46  P  FEV1-Post L 1.13  P  DLCO uncorrected ml/min/mmHg 8.96  P  DLCO UNC% % 48  P  DLVA Predicted % 58  P  TLC L 4.99  P  TLC % Predicted % 104  P  RV % Predicted % 150  P    P Preliminary result  Personally reviewed and interpreted as severe fixed obstruction, no bronchodilator response, lung volumes consistent with air trapping, DLCO severely reduced.  WALK:      No data to display          Imaging: Personally reviewed and as per EMR discussion this note MM 3D SCREENING MAMMOGRAM BILATERAL BREAST Result Date: 01/13/2024 CLINICAL DATA:  Screening. EXAM: DIGITAL SCREENING BILATERAL MAMMOGRAM WITH TOMOSYNTHESIS AND CAD TECHNIQUE: Bilateral screening digital craniocaudal and mediolateral oblique mammograms were obtained. Bilateral screening digital breast tomosynthesis was performed. The images were evaluated with computer-aided detection. COMPARISON:  Previous exam(s). ACR Breast Density Category b: There are scattered areas of fibroglandular density. FINDINGS: There are no findings suspicious for malignancy. IMPRESSION: No mammographic evidence of malignancy. A result letter of this screening mammogram will be mailed directly to the patient. RECOMMENDATION: Screening mammogram in one year. (Code:SM-B-01Y) BI-RADS CATEGORY  1: Negative. Electronically Signed   By: Craig Farr M.D.   On: 01/13/2024 11:31    Lab Results: Personally reviewed, no significant anemia on most recent labs 03/2022 via care everywhere CBC    Component Value Date/Time   WBC 10.7 (H) 10/20/2023 0853   RBC 5.10 10/20/2023 0853   HGB 14.7 10/20/2023 0853   HGB 13.6 05/25/2017 1219    HCT 45.2 10/20/2023 0853   HCT 40.7 05/25/2017 1219   PLT 227 10/20/2023 0853   PLT 256 05/25/2017 1219   MCV 88.6 10/20/2023 0853   MCV 85 05/25/2017 1219   MCH 28.8 10/20/2023 0853   MCHC 32.5 10/20/2023 0853   RDW 15.3 10/20/2023 0853   RDW 16.3 (H) 05/25/2017 1219   LYMPHSABS 1.9 12/01/2019 1143   MONOABS 0.6 12/01/2019 1143   EOSABS 0.1 12/01/2019 1143   BASOSABS 0.0 12/01/2019 1143    BMET    Component Value Date/Time   NA 140 10/20/2023 0853   NA 139 05/25/2017 1219   K 3.4 (L) 10/20/2023 0853   CL 102 10/20/2023 0853   CO2 28 10/20/2023 0853   GLUCOSE 96 10/20/2023 0853   BUN 13 10/20/2023 0853   BUN 14 05/25/2017 1219   CREATININE 0.74 10/20/2023 0853   CALCIUM  9.4 10/20/2023 0853   GFRNONAA >60 10/20/2023 0853   GFRAA >60 12/05/2019 1826    BNP    Component Value Date/Time   BNP 96.0 07/21/2023 1157    ProBNP No results found for: PROBNP  Specialty Problems       Pulmonary Problems   Shortness of breath on exertion   Hypoxia    Allergies  Allergen Reactions   Motrin [Ibuprofen] Hives   Bactrim [Sulfamethoxazole-Trimethoprim] Rash   Lisinopril  Cough   Sulfa Antibiotics Hives and Rash    Immunization History  Administered Date(s) Administered   PFIZER(Purple Top)SARS-COV-2 Vaccination 11/13/2019    Past Medical History:  Diagnosis Date   Abdominal pain, right lower quadrant    Abdominal wall pain    Arterial bruit    Arthritis    Arthropathy    Asthma    Barrett's esophagus    Callus    Carotid stenosis, bilateral    Chest pain in adult    Chronic back pain    Colon polyp    Coronary artery disease, occlusive    stent x 2   Depression    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 pylori infection    H/O: hysterectomy    Hypersomnia    Hypertension    Hypothyroidism     IBS (irritable bowel syndrome)    Inflammatory bowel disease    colitis   Inlet patch of esophagus    Myocardial infarction (HCC)    Neuromuscular disorder (HCC)  restless leg syndrome   Pneumonia    Precordial pain    PVD (peripheral vascular disease) (HCC)    pt. denies   Restless leg syndrome    Right foot pain    Shortness of breath on exertion    Shoulder pain, right    Ulcer of esophagus without bleeding     Tobacco History: Social History   Tobacco Use  Smoking Status Every Day   Current packs/day: 0.50   Average packs/day: 0.5 packs/day for 25.0 years (12.5 ttl pk-yrs)   Types: Cigarettes  Smokeless Tobacco Never  Tobacco Comments   Trying to stop smoking   Ready to quit: Not Answered Counseling given: Not Answered Tobacco comments: Trying to stop smoking   Continue to not smoke  Outpatient Encounter Medications as of 02/01/2024  Medication Sig   albuterol  (VENTOLIN  HFA) 108 (90 Base) MCG/ACT inhaler Inhale 2 puffs into the lungs every 6 (six) hours as needed for wheezing or shortness of breath.   amLODipine  (NORVASC ) 10 MG tablet Take 10 mg by mouth daily.   amLODipine -olmesartan (AZOR) 10-40 MG tablet Take 1 tablet by mouth daily.   aspirin  EC 81 MG tablet Take 81 mg by mouth daily.   atenolol (TENORMIN) 25 MG tablet Take 25 mg by mouth daily.   atorvastatin  (LIPITOR) 20 MG tablet Take 20 mg by mouth at bedtime.   azithromycin  (ZITHROMAX ) 250 MG tablet Take 1 tablet (250 mg total) by mouth daily. Take first 2 tablets together, then 1 every day until finished. (Patient not taking: Reported on 10/15/2023)   benzonatate (TESSALON) 100 MG capsule Take by mouth 2 (two) times daily as needed for cough.   cholecalciferol (VITAMIN D3) 25 MCG (1000 UNIT) tablet Take 1,000 Units by mouth daily.   clopidogrel  (PLAVIX ) 75 MG tablet Take 1 tablet (75 mg total) by mouth daily. Please schedule annual appt with Dr. Court for refills. [phone]. 1st attempt. (Patient not  taking: Reported on 10/15/2023)   diclofenac Sodium (VOLTAREN) 1 % GEL Apply 4 g topically 4 (four) times daily.   HYDROcodone -acetaminophen  (NORCO) 7.5-325 MG tablet Take 1 tablet by mouth 3 (three) times daily as needed for moderate pain. (Patient not taking: Reported on 10/15/2023)   levothyroxine  (SYNTHROID ) 100 MCG tablet Take 100 mcg by mouth daily before breakfast.   metFORMIN (GLUCOPHAGE) 500 MG tablet Take 500 mg by mouth 3 (three) times daily with meals.   nitroGLYCERIN  (NITROSTAT ) 0.4 MG SL tablet Place 1 tablet (0.4 mg total) under the tongue every 5 (five) minutes as needed for chest pain. (Patient not taking: Reported on 10/15/2023)   omeprazole (PRILOSEC) 40 MG capsule Take 40 mg by mouth daily.   oxyCODONE  (OXY IR/ROXICODONE ) 5 MG immediate release tablet Take 1 tablet (5 mg total) by mouth every 6 (six) hours as needed for severe pain (pain score 7-10).   Tiotropium Bromide-Olodaterol (STIOLTO RESPIMAT ) 2.5-2.5 MCG/ACT AERS Inhale 2 puffs into the lungs daily.   TRESIBA FLEXTOUCH 200 UNIT/ML SOPN Inject 60 Units into the skin in the morning.   No facility-administered encounter medications on file as of 02/01/2024.     Review of Systems  Review of Systems  N/a Physical Exam  BP 120/71 (BP Location: Left Arm, Patient Position: Sitting, Cuff Size: Normal)   Pulse (!) 58   Ht 5' 2 (1.575 m)   Wt 152 lb (68.9 kg)   SpO2 98%   BMI 27.80 kg/m   Wt Readings from Last 5 Encounters:  02/01/24 152 lb (68.9  kg)  11/05/23 162 lb (73.5 kg)  10/20/23 162 lb (73.5 kg)  10/06/23 161 lb 6.4 oz (73.2 kg)  04/22/23 162 lb (73.5 kg)    BMI Readings from Last 5 Encounters:  02/01/24 27.80 kg/m  11/05/23 29.63 kg/m  10/20/23 29.63 kg/m  10/06/23 29.52 kg/m  04/22/23 29.63 kg/m     Physical Exam General: Sitting in chair, no acute distress Eyes: EOMI, no icterus Neck: Supple, no JVP appreciated sitting upright Abdomen: Nondistended, bowel sounds present MSK: No  synovitis, no joint effusion Neuro: Normal gait, no weakness Psych: Normal mood, full affect Pulmonary: Clear, no work of breathing Cardiovascular: Warm, no edema   Assessment & Plan:   Dyspnea on exertion: Suspect multifactorial.  Possibly related to habitus, deconditioning, smoking-related lung disease, known CAD.  Sliding hiatal hernia, fat hernia into thorax could be a mild contributor to restrictive physiology but unlikely to cause significant symptoms in isolation.  Cross-sectional imaging of the lungs reports normal findings without discussion of emphysema.  PFTs consistent with severe COPD with air trapping 02/2024.  Improved with Stiolto, decrease in albuterol  usage.  Severe COPD: Gold B.  Rare exacerbations.  Improved with Stiolto.  Decrease in albuterol  usage.  Consider escalation to ICS LAMA LABA in the future if symptoms worsen or exacerbations were to occur.  Spacer provided today to use with albuterol .  Voice hoarseness: Unclear etiology, no mucus production.  She has reflux, this is the most likely cause, posterior pharynx ongoing inflammation.  Fortunately her acid is well treated with PPI but suspect silent reflux.  She had thyroid  surgery many many years ago.  This is quite possibly a culprit.  Recommend ENT referral, ENT referral sent at last visit.  She is leery of this given bad experience in the past but I encouraged her to pursue this.  She no-showed her appointment for ENT.  Consider referral in the future if she so desires.   Return in about 6 months (around 08/03/2024) for f/u Dr. Annella.   Donnice JONELLE Annella, MD 02/01/2024

## 2024-02-01 NOTE — Patient Instructions (Signed)
 Pulmonary function test completed today, these are consistent with COPD  I am glad the Stiolto has helped, this makes sense it would help, this is often very beneficial when people have COPD  Continue Stiolto 2 puffs once a day, continue albuterol  as needed.  Use the albuterol  with the spacer, that to be provided today.  Return to clinic in 6 months or sooner as needed with Dr. Annella

## 2024-02-01 NOTE — Patient Instructions (Signed)
 Full pft performed today.

## 2024-03-09 ENCOUNTER — Ambulatory Visit (INDEPENDENT_AMBULATORY_CARE_PROVIDER_SITE_OTHER): Admitting: Podiatry

## 2024-03-09 ENCOUNTER — Encounter: Payer: Self-pay | Admitting: Podiatry

## 2024-03-09 DIAGNOSIS — M79675 Pain in left toe(s): Secondary | ICD-10-CM | POA: Diagnosis not present

## 2024-03-09 DIAGNOSIS — B351 Tinea unguium: Secondary | ICD-10-CM

## 2024-03-09 DIAGNOSIS — M7752 Other enthesopathy of left foot: Secondary | ICD-10-CM

## 2024-03-09 DIAGNOSIS — M79674 Pain in right toe(s): Secondary | ICD-10-CM | POA: Diagnosis not present

## 2024-03-09 NOTE — Progress Notes (Signed)
 Subjective:   Patient ID: [name], female   DOB: 63 y.o.   MRN: [id number]   HPI Patient presents with a lot of inflammation in the fourth digit of the left foot with keratotic tissue formation and lesions plantar and thick toenails bilateral   ROS      Objective:  Physical Exam  Neurovascular status intact with pain of the fourth digit left foot with fluid buildup around the distal inner phalangeal joint and also noted to have thick yellow brittle nailbeds 1-5 both feet     Assessment:  Mycotic nail infection 1-5 both feet with pain along with inflammation and capsulitis fourth digit left painful when pressed     Plan:  H&P reviewed and I went ahead today sterile prep and I injected the inner phalangeal joint left fourth toe 3 mg dexamethasone  Kenalog 5 mg Xylocaine  debrided lesion courtesy and debrided nailbeds 1-5 both feet no iatrogenic bleeding reappoint routine care.  May require ultimate digital surgery

## 2024-03-24 ENCOUNTER — Other Ambulatory Visit: Payer: Self-pay | Admitting: Nurse Practitioner

## 2024-03-24 DIAGNOSIS — K429 Umbilical hernia without obstruction or gangrene: Secondary | ICD-10-CM

## 2024-03-27 ENCOUNTER — Other Ambulatory Visit

## 2024-04-05 ENCOUNTER — Ambulatory Visit
Admission: RE | Admit: 2024-04-05 | Discharge: 2024-04-05 | Disposition: A | Discharge status: Home / Self Care | Source: Ambulatory Visit | Attending: Nurse Practitioner | Admitting: Nurse Practitioner

## 2024-04-05 DIAGNOSIS — K429 Umbilical hernia without obstruction or gangrene: Secondary | ICD-10-CM

## 2024-05-30 ENCOUNTER — Other Ambulatory Visit: Payer: Self-pay | Admitting: Nurse Practitioner

## 2024-05-30 ENCOUNTER — Ambulatory Visit
Admission: RE | Admit: 2024-05-30 | Discharge: 2024-05-30 | Disposition: A | Discharge status: Home / Self Care | Source: Ambulatory Visit | Attending: Nurse Practitioner | Admitting: Nurse Practitioner

## 2024-05-30 DIAGNOSIS — R059 Cough, unspecified: Secondary | ICD-10-CM

## 2024-06-05 ENCOUNTER — Other Ambulatory Visit: Payer: Self-pay | Admitting: Internal Medicine

## 2024-06-05 DIAGNOSIS — Z1231 Encounter for screening mammogram for malignant neoplasm of breast: Secondary | ICD-10-CM

## 2024-08-07 ENCOUNTER — Ambulatory Visit: Admitting: Pulmonary Disease

## 2024-08-07 ENCOUNTER — Encounter: Payer: Self-pay | Admitting: Pulmonary Disease

## 2024-08-07 VITALS — BP 129/70 | HR 65 | Ht 62.0 in | Wt 155.8 lb

## 2024-08-07 DIAGNOSIS — J441 Chronic obstructive pulmonary disease with (acute) exacerbation: Secondary | ICD-10-CM | POA: Diagnosis not present

## 2024-08-07 DIAGNOSIS — R490 Dysphonia: Secondary | ICD-10-CM

## 2024-08-07 DIAGNOSIS — J449 Chronic obstructive pulmonary disease, unspecified: Secondary | ICD-10-CM

## 2024-08-07 DIAGNOSIS — R0609 Other forms of dyspnea: Secondary | ICD-10-CM | POA: Diagnosis not present

## 2024-08-07 DIAGNOSIS — F17210 Nicotine dependence, cigarettes, uncomplicated: Secondary | ICD-10-CM | POA: Diagnosis not present

## 2024-08-07 MED ORDER — PREDNISONE 20 MG PO TABS: ORAL_TABLET | ORAL | 0 refills | Status: AC

## 2024-08-07 NOTE — Patient Instructions (Signed)
 Nice to see you again  With the cough going on for the last couple weeks take prednisone  as prescribed, 2 pills for 5 days then 1 pill for 5 days then stop  Continue inhalers as you are  Return to clinic in 6 months or sooner as needed with Dr. Annella

## 2024-08-07 NOTE — Progress Notes (Signed)
 "  @Patient  ID: [name], female    DOB: 1961/05/12, 64 y.o.   MRN: [id number]  Chief Complaint  Patient presents with   Medical Management of Chronic Issues    Pt states this have been well no new concerns     Referring provider: Cloria Annabella CROME, DO  HPI:   64 y.o. woman with significant coronary artery disease status post PCI times 09/22/2016 whom we are seeing for evaluation of dyspnea on exertion.  Recent ENT note reviewed.  Most recent cardiology note reviewed.  Most recent podiatry note reviewed.  Return to routine follow-up.  Overall doing okay.  Taking Stiolto as prescribed.  She describes occasional rescue inhaler use.  Unfortunate, increased congestion over the last couple weeks.  Coincides with trip back to [place] , home, for Christmas.  Since then she has had a cough worse than baseline.  She does largely dry but it is wet sounding in clinic today.  In combination with this she has had increased risk inhaler use, twice a day.  Recently given antibiotic with similar symptoms by ENT for separate issue, no improvement.  We discussed trial of steroid, prednisone  and she agreed to this.  HPI at initial visit: She notes dyspnea for the last year or so.  Only with exertion.  No shortness of breath at rest.  Not at night.  No time of day when things are better or worse.  No position that makes things better or worse.  No seasonal or environmental factors she can identify to make things better or worse.  In the midst of the symptoms labs were checked, hemoglobin of 12.  She has had cross-sectional imaging x 2 over the last year.  CT PE protocol 03/2022 with fat hernias and small sliding hernia, otherwise clear lungs no discussion of emphysema on my review interpretation.  CT low-dose screening 07/2022 with clear lungs described bilaterally, no mention of nodule or lesion or abnormality such as emphysema on my review.  She has been seen by cardiology.  No TTE since 2018 that I can see.  There  are perfusion studies 2018 that indicated normal EF.  Notably at time of her catheterization and PCI in 2018 estimated EF was 45% on LV gram.  Questionaires / Pulmonary Flowsheets:   ACT:      No data to display          MMRC:     No data to display          Epworth:      No data to display          Tests:   FENO:  No results found for: NITRICOXIDE  PFT:    Latest Ref Rng & Units 02/01/2024    8:37 AM  PFT Results  FVC-Pre L 1.67   FVC-Predicted Pre % 56   FVC-Post L 1.82   FVC-Predicted Post % 61   Pre FEV1/FVC % % 62   Post FEV1/FCV % % 62   FEV1-Pre L 1.04   FEV1-Predicted Pre % 46   FEV1-Post L 1.13   DLCO uncorrected ml/min/mmHg 8.96   DLCO UNC% % 48   DLVA Predicted % 58   TLC L 4.99   TLC % Predicted % 104   RV % Predicted % 150   Personally reviewed and interpreted as severe fixed obstruction, no bronchodilator response, lung volumes consistent with air trapping, DLCO severely reduced.  WALK:      No data to display  Imaging: Personally reviewed and as per EMR discussion this note No results found.   Lab Results: Personally reviewed, no significant anemia on most recent labs 03/2022 via care everywhere CBC    Component Value Date/Time   WBC 10.7 (H) 10/20/2023 0853   RBC 5.10 10/20/2023 0853   HGB 14.7 10/20/2023 0853   HGB 13.6 05/25/2017 1219   HCT 45.2 10/20/2023 0853   HCT 40.7 05/25/2017 1219   PLT 227 10/20/2023 0853   PLT 256 05/25/2017 1219   MCV 88.6 10/20/2023 0853   MCV 85 05/25/2017 1219   MCH 28.8 10/20/2023 0853   MCHC 32.5 10/20/2023 0853   RDW 15.3 10/20/2023 0853   RDW 16.3 (H) 05/25/2017 1219   LYMPHSABS 1.9 12/01/2019 1143   MONOABS 0.6 12/01/2019 1143   EOSABS 0.1 12/01/2019 1143   BASOSABS 0.0 12/01/2019 1143    BMET    Component Value Date/Time   NA 140 10/20/2023 0853   NA 139 05/25/2017 1219   K 3.4 (L) 10/20/2023 0853   CL 102 10/20/2023 0853   CO2 28 10/20/2023 0853   GLUCOSE  96 10/20/2023 0853   BUN 13 10/20/2023 0853   BUN 14 05/25/2017 1219   CREATININE 0.74 10/20/2023 0853   CALCIUM  9.4 10/20/2023 0853   GFRNONAA >60 10/20/2023 0853   GFRAA >60 12/05/2019 1826    BNP    Component Value Date/Time   BNP 96.0 07/21/2023 1157    ProBNP No results found for: PROBNP  Specialty Problems       Pulmonary Problems   Shortness of breath on exertion   Hypoxia   Chronic obstructive pulmonary disease (HCC)    Allergies  Allergen Reactions   Motrin [Ibuprofen] Hives   Bactrim [Sulfamethoxazole-Trimethoprim] Rash   Lisinopril  Cough   Sulfa Antibiotics Hives and Rash    Immunization History  Administered Date(s) Administered   PFIZER(Purple Top)SARS-COV-2 Vaccination 11/13/2019    Past Medical History:  Diagnosis Date   Abdominal pain, right lower quadrant    Abdominal wall pain    Arterial bruit    Arthritis    Arthropathy    Asthma    Barrett's esophagus    Callus    Carotid stenosis, bilateral    Chest pain in adult    Chronic back pain    Colon polyp    Coronary artery disease, occlusive    stent x 2   Depression    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 pylori infection    H/O: hysterectomy    Hypersomnia    Hypertension    Hypothyroidism    IBS (irritable bowel syndrome)    Inflammatory bowel disease    colitis   Inlet patch of esophagus    Myocardial infarction (HCC)    Neuromuscular disorder (HCC)    restless leg syndrome   Pneumonia    Precordial pain    PVD (peripheral vascular disease)    pt. denies   Restless leg syndrome    Right foot pain    Shortness of breath on exertion    Shoulder pain, right    Ulcer of esophagus without bleeding     Tobacco History: Social History   Tobacco Use  Smoking Status Every Day   Current packs/day: 0.50    Average packs/day: 0.5 packs/day  for 25.0 years (12.5 ttl pk-yrs)   Types: Cigarettes  Smokeless Tobacco Never  Tobacco Comments   Trying to stop smoking   Ready to quit: Not Answered Counseling given: Not Answered Tobacco comments: Trying to stop smoking   Continue to not smoke  Outpatient Encounter Medications as of 08/07/2024  Medication Sig   predniSONE  (DELTASONE ) 20 MG tablet Take 2 tablets (40 mg total) by mouth daily with breakfast for 5 days, THEN 1 tablet (20 mg total) daily with breakfast for 5 days.   albuterol  (VENTOLIN  HFA) 108 (90 Base) MCG/ACT inhaler Inhale 2 puffs into the lungs every 6 (six) hours as needed for wheezing or shortness of breath.   amLODipine  (NORVASC ) 10 MG tablet Take 10 mg by mouth daily.   amLODipine -olmesartan (AZOR) 10-40 MG tablet Take 1 tablet by mouth daily.   aspirin  EC 81 MG tablet Take 81 mg by mouth daily.   atenolol (TENORMIN) 25 MG tablet Take 25 mg by mouth daily.   atorvastatin  (LIPITOR) 20 MG tablet Take 20 mg by mouth at bedtime.   benzonatate (TESSALON) 100 MG capsule Take by mouth 2 (two) times daily as needed for cough.   cholecalciferol (VITAMIN D3) 25 MCG (1000 UNIT) tablet Take 1,000 Units by mouth daily.   clopidogrel  (PLAVIX ) 75 MG tablet Take 1 tablet (75 mg total) by mouth daily. Please schedule annual appt with Dr. Court for refills. [phone]. 1st attempt. (Patient not taking: Reported on 10/15/2023)   diclofenac Sodium (VOLTAREN) 1 % GEL Apply 4 g topically 4 (four) times daily.   HYDROcodone -acetaminophen  (NORCO) 7.5-325 MG tablet Take 1 tablet by mouth 3 (three) times daily as needed for moderate pain. (Patient not taking: Reported on 10/15/2023)   levothyroxine  (SYNTHROID ) 100 MCG tablet Take 100 mcg by mouth daily before breakfast.   metFORMIN (GLUCOPHAGE) 500 MG tablet Take 500 mg by mouth 3 (three) times daily with meals.   nitroGLYCERIN  (NITROSTAT ) 0.4 MG SL tablet Place 1 tablet (0.4 mg total) under the tongue every  5 (five) minutes as needed for chest pain.   omeprazole (PRILOSEC) 40 MG capsule Take 40 mg by mouth daily.   oxyCODONE  (OXY IR/ROXICODONE ) 5 MG immediate release tablet Take 1 tablet (5 mg total) by mouth every 6 (six) hours as needed for severe pain (pain score 7-10).   Tiotropium Bromide-Olodaterol (STIOLTO RESPIMAT ) 2.5-2.5 MCG/ACT AERS Inhale 2 puffs into the lungs daily.   TRESIBA FLEXTOUCH 200 UNIT/ML SOPN Inject 60 Units into the skin in the morning.   [DISCONTINUED] azithromycin  (ZITHROMAX ) 250 MG tablet Take 1 tablet (250 mg total) by mouth daily. Take first 2 tablets together, then 1 every day until finished. (Patient not taking: Reported on 10/15/2023)   No facility-administered encounter medications on file as of 08/07/2024.     Review of Systems  Review of Systems  N/a Physical Exam  BP 129/70   Pulse 65   Ht 5' 2 (1.575 m) Comment: per pt  Wt 155 lb 12.8 oz (70.7 kg)   SpO2 92%   BMI 28.50 kg/m   Wt Readings from Last 5 Encounters:  08/07/24 155 lb 12.8 oz (70.7 kg)  02/01/24 152 lb (68.9 kg)  11/05/23 162 lb (73.5 kg)  10/20/23 162 lb (73.5 kg)  10/06/23 161 lb 6.4 oz (73.2 kg)    BMI Readings from Last 5 Encounters:  08/07/24 28.50 kg/m  02/01/24 27.80 kg/m  11/05/23 29.63 kg/m  10/20/23 29.63 kg/m  10/06/23 29.52 kg/m     Physical Exam  General: In chair, no distress Eyes: No icterus Neck: No JVP Cardiovascular: Regular rate and rhythm Pulmonary: Clear, normal work of breathing   Assessment & Plan:   Dyspnea on exertion: Suspect multifactorial.  Possibly related to habitus, deconditioning, smoking-related lung disease, known CAD.  Sliding hiatal hernia, fat hernia into thorax could be a mild contributor to restrictive physiology but unlikely to cause significant symptoms in isolation.  Cross-sectional imaging of the lungs reports normal findings without discussion of emphysema.  PFTs consistent with severe COPD with air trapping 02/2024.   Improved with Stiolto, resulting in decrease in albuterol  usage.  Severe COPD with mild exacerbation: Gold B.  Rare exacerbations.  Improved with Stiolto.  Decrease in albuterol  usage.  Unfortunately, worsening cough over the last 2 weeks after visit to home, around family for the holidays.  Not improved with antibiotic.  Prednisone  taper today.  Voice hoarseness: Seen by ENT recently.  Granuloma.  Conservative measures.  They are considering surgical removal to see if it helps.  No evidence of cancer.  Great news.  Return in about 6 months (around 02/04/2025) for f/u Dr. Annella.   Donnice JONELLE Annella, MD 08/07/2024    "

## 2025-01-11 ENCOUNTER — Ambulatory Visit

## 2025-01-23 ENCOUNTER — Ambulatory Visit: Admitting: Pulmonary Disease
